# Patient Record
Sex: Male | Born: 1945
Health system: Southern US, Community
[De-identification: ages and names within clinical notes are randomized; demographics above are authoritative.]

## PROBLEM LIST (undated history)

## (undated) DIAGNOSIS — I10 Essential (primary) hypertension: Secondary | ICD-10-CM

## (undated) DIAGNOSIS — R0602 Shortness of breath: Secondary | ICD-10-CM

## (undated) DIAGNOSIS — G4730 Sleep apnea, unspecified: Secondary | ICD-10-CM

## (undated) DIAGNOSIS — F419 Anxiety disorder, unspecified: Secondary | ICD-10-CM

## (undated) DIAGNOSIS — I4891 Unspecified atrial fibrillation: Secondary | ICD-10-CM

## (undated) HISTORY — PX: GREEN LIGHT LASER TURP (TRANSURETHRAL RESECTION OF PROSTATE: SHX6260

## (undated) HISTORY — PX: CYSTOSCOPY: SUR368

## (undated) HISTORY — PX: OTHER SURGICAL HISTORY: SHX169

---

## 2003-06-11 ENCOUNTER — Ambulatory Visit (HOSPITAL_COMMUNITY): Admission: RE | Admit: 2003-06-11 | Discharge: 2003-06-11 | Payer: Self-pay | Admitting: Internal Medicine

## 2014-04-02 ENCOUNTER — Telehealth (INDEPENDENT_AMBULATORY_CARE_PROVIDER_SITE_OTHER): Payer: Self-pay | Admitting: *Deleted

## 2014-04-02 NOTE — Telephone Encounter (Signed)
Patient leaves message   He needs to schedule TCS --last 7 years ago.  Please call him when you can   [phone]

## 2014-04-04 ENCOUNTER — Encounter (INDEPENDENT_AMBULATORY_CARE_PROVIDER_SITE_OTHER): Payer: Self-pay

## 2014-04-04 ENCOUNTER — Other Ambulatory Visit (INDEPENDENT_AMBULATORY_CARE_PROVIDER_SITE_OTHER): Payer: Self-pay | Admitting: *Deleted

## 2014-04-04 DIAGNOSIS — Z86010 Personal history of colonic polyps: Secondary | ICD-10-CM

## 2014-04-04 NOTE — Telephone Encounter (Signed)
TCS sch'd 06/20/14, patient aware

## 2014-04-25 ENCOUNTER — Telehealth (INDEPENDENT_AMBULATORY_CARE_PROVIDER_SITE_OTHER): Payer: Self-pay | Admitting: *Deleted

## 2014-04-25 DIAGNOSIS — Z1211 Encounter for screening for malignant neoplasm of colon: Secondary | ICD-10-CM

## 2014-04-25 NOTE — Telephone Encounter (Signed)
Patient needs movi prep 

## 2014-04-27 MED ORDER — PEG-KCL-NACL-NASULF-NA ASC-C 100 G PO SOLR: 1.0000 | Freq: Once | ORAL | Status: DC

## 2014-05-23 ENCOUNTER — Telehealth (INDEPENDENT_AMBULATORY_CARE_PROVIDER_SITE_OTHER): Payer: Self-pay | Admitting: *Deleted

## 2014-05-23 NOTE — Telephone Encounter (Signed)
Referring MD/PCP: vyas   Procedure: tcs  Reason/Indication:  Hx polyps  Has patient had this procedure before?  Yes -- 2009 -- scanned  If so, when, by whom and where?    Is there a family history of colon cancer?  no  Who?  What age when diagnosed?    Is patient diabetic?   no      Does patient have prosthetic heart valve?  no  Do you have a pacemaker?  no  Has patient ever had endocarditis? no  Has patient had joint replacement within last 12 months?  no  Does patient tend to be constipated or take laxatives? no  Is patient on Coumadin, Plavix and/or Aspirin? no  Medications: advil prn, tamsulosin 4 mg daily, lisinopril 40 mg daily, simvastatin 80 mg 1/2 tab daily, verapamil 240 mg daily  Allergies: nkda  Medication Adjustment:   Procedure date & time: 06/20/14 at 1030

## 2014-05-28 NOTE — Telephone Encounter (Signed)
agree

## 2014-06-05 ENCOUNTER — Encounter (HOSPITAL_COMMUNITY): Payer: Self-pay | Admitting: Pharmacy Technician

## 2014-06-20 ENCOUNTER — Encounter (HOSPITAL_COMMUNITY)
Admission: RE | Disposition: A | Discharge status: Home / Self Care | Payer: Self-pay | Source: Ambulatory Visit | Attending: Internal Medicine

## 2014-06-20 ENCOUNTER — Encounter (HOSPITAL_COMMUNITY): Payer: Self-pay | Admitting: *Deleted

## 2014-06-20 ENCOUNTER — Ambulatory Visit (HOSPITAL_COMMUNITY)
Admission: RE | Admit: 2014-06-20 | Discharge: 2014-06-20 | Disposition: A | Discharge status: Home / Self Care | Payer: Medicare Other | Source: Ambulatory Visit | Attending: Internal Medicine | Admitting: Internal Medicine

## 2014-06-20 DIAGNOSIS — Z87891 Personal history of nicotine dependence: Secondary | ICD-10-CM | POA: Insufficient documentation

## 2014-06-20 DIAGNOSIS — K5730 Diverticulosis of large intestine without perforation or abscess without bleeding: Secondary | ICD-10-CM | POA: Insufficient documentation

## 2014-06-20 DIAGNOSIS — Z1211 Encounter for screening for malignant neoplasm of colon: Secondary | ICD-10-CM | POA: Diagnosis present

## 2014-06-20 DIAGNOSIS — K644 Residual hemorrhoidal skin tags: Secondary | ICD-10-CM | POA: Diagnosis not present

## 2014-06-20 DIAGNOSIS — I10 Essential (primary) hypertension: Secondary | ICD-10-CM | POA: Insufficient documentation

## 2014-06-20 DIAGNOSIS — Z79899 Other long term (current) drug therapy: Secondary | ICD-10-CM | POA: Diagnosis not present

## 2014-06-20 DIAGNOSIS — Z86010 Personal history of colonic polyps: Secondary | ICD-10-CM | POA: Insufficient documentation

## 2014-06-20 DIAGNOSIS — K649 Unspecified hemorrhoids: Secondary | ICD-10-CM | POA: Diagnosis not present

## 2014-06-20 HISTORY — PX: COLONOSCOPY: SHX5424

## 2014-06-20 HISTORY — DX: Essential (primary) hypertension: I10

## 2014-06-20 HISTORY — DX: Shortness of breath: R06.02

## 2014-06-20 SURGERY — COLONOSCOPY
Anesthesia: Moderate Sedation

## 2014-06-20 MED ORDER — MIDAZOLAM HCL 5 MG/5ML IJ SOLN
INTRAMUSCULAR | Status: DC | PRN
  Administered 2014-06-20 (×2): 2 mg via INTRAVENOUS
  Administered 2014-06-20: 3 mg via INTRAVENOUS

## 2014-06-20 MED ORDER — MIDAZOLAM HCL 5 MG/5ML IJ SOLN
INTRAMUSCULAR | Status: AC
  Filled 2014-06-20: qty 10

## 2014-06-20 MED ORDER — SODIUM CHLORIDE 0.9 % IV SOLN
INTRAVENOUS | Status: DC
  Administered 2014-06-20: 1000 mL via INTRAVENOUS

## 2014-06-20 MED ORDER — MEPERIDINE HCL 50 MG/ML IJ SOLN
INTRAMUSCULAR | Status: DC | PRN
  Administered 2014-06-20 (×2): 25 mg via INTRAVENOUS

## 2014-06-20 MED ORDER — MEPERIDINE HCL 50 MG/ML IJ SOLN
INTRAMUSCULAR | Status: AC
  Filled 2014-06-20: qty 1

## 2014-06-20 MED ORDER — STERILE WATER FOR IRRIGATION IR SOLN
Status: DC | PRN
  Administered 2014-06-20: 11:00:00

## 2014-06-20 NOTE — Op Note (Signed)
COLONOSCOPY PROCEDURE REPORT  PATIENT:  Robert Warren  MR#:  [id number] Birthdate:  1946/06/01, 68 y.o., male Endoscopist:  Dr. Rogene Houston, MD Referred By:  Dr. Glenda Chroman, MD Procedure Date: 06/20/2014  Procedure:   Colonoscopy  Indications: Patient is 68 year old African-American male with history of colonic adenomas. He is here for surveillance colonoscopy. Last exam was in June 2009. This is patient's third colonoscopy.  Informed Consent:  The procedure and risks were reviewed with the patient and informed consent was obtained.  Medications:  Demerol 50 mg IV Versed 7 mg IV  Description of procedure:  After a digital rectal exam was performed, that colonoscope was advanced from the anus through the rectum and colon to the area of the cecum, ileocecal valve and appendiceal orifice. The cecum was deeply intubated. These structures were well-seen and photographed for the record. From the level of the cecum and ileocecal valve, the scope was slowly and cautiously withdrawn. The mucosal surfaces were carefully surveyed utilizing scope tip to flexion to facilitate fold flattening as needed. The scope was pulled down into the rectum where a thorough exam including retroflexion was performed.  Findings:   Prep satisfactory except there was some stool at cecum and was easily washed away. Scattered diverticula at cecum, ascending and sigmoid colon. Normal rectal mucosa. Small hemorrhoids below the dentate line.   Therapeutic/Diagnostic Maneuvers Performed:  None  Complications:  None  Cecal Withdrawal Time:  14 minutes  Impression:  Examination performed to cecum. No evidence of recurrent polyps. Data diverticula at cecum, ascending and sigmoid colon. External hemorrhoids.  Recommendations:  Standard instructions given. Next colonoscopy in 7 years.  [name]  06/20/2014 11:07 AM  CC: Dr. Glenda Chroman., MD & Dr. Rayne Du ref. provider found

## 2014-06-20 NOTE — H&P (Addendum)
Robert Warren is an 68 y.o. male.   Chief Complaint: Patient is here for colonoscopy. HPI: Patient is 68 year old American male who has history of colonic adenomas and is here for surveillance colonoscopy. He denies abdominal pain change in bowel habits or rectal bleeding. This is patient's third exam. Family history is negative for CRC.  Past Medical History  Diagnosis Date  . Shortness of breath   . Hypertension     Past Surgical History  Procedure Laterality Date  . Anal cyst removal      Family History  Problem Relation Age of Onset  . Heart disease Mother   . Cancer - Lung Father    Social History:  reports that he quit smoking about 17 years ago. His smoking use included Cigarettes. He has a 30 pack-year smoking history. He does not have any smokeless tobacco history on file. He reports that he does not drink alcohol or use illicit drugs.  Allergies: No Known Allergies  Medications Prior to Admission  Medication Sig Dispense Refill  . lisinopril (PRINIVIL,ZESTRIL) 40 MG tablet Take 40 mg by mouth daily.      . peg 3350 powder (MOVIPREP) 100 G SOLR Take 1 kit (200 g total) by mouth once.  1 kit  0  . simvastatin (ZOCOR) 80 MG tablet Take 40 mg by mouth daily.      . tamsulosin (FLOMAX) 0.4 MG CAPS capsule Take 0.4 mg by mouth daily.      . verapamil (CALAN-SR) 240 MG CR tablet Take 240 mg by mouth daily.        No results found for this or any previous visit (from the past 48 hour(s)). No results found.  ROS  Blood pressure 149/88, pulse 57, temperature 98.4 F (36.9 C), temperature source Oral, resp. rate 15, height 6' 4" (1.93 m), weight 370 lb (167.831 kg), SpO2 98.00%. Physical Exam  Constitutional:  Well-developed obese African-American male  HENT:  Mouth/Throat: Oropharynx is clear and moist.  Eyes: Conjunctivae are normal. No scleral icterus.  Neck: No thyromegaly present.  Multiple skin tags  Cardiovascular: Normal rate, regular rhythm and normal heart  sounds.   No murmur heard. Respiratory: Effort normal and breath sounds normal.  GI: Soft. He exhibits no distension and no mass. There is no tenderness.  Musculoskeletal: He exhibits no edema.  Lymphadenopathy:    He has no cervical adenopathy.  Neurological: He is alert.  Skin: Skin is warm and dry.     Assessment/Plan History of colonic adenomas. Surveillance colonoscopy.  Danity Schmelzer U 06/20/2014, 10:34 AM

## 2014-06-20 NOTE — Discharge Instructions (Signed)
Resume usual medications and high fiber diet. No driving for 24 hours. Next colonoscopy in 7 years.Colonoscopy, Care After Refer to this sheet in the next few weeks. These instructions provide you with information on caring for yourself after your procedure. Your health care provider may also give you more specific instructions. Your treatment has been planned according to current medical practices, but problems sometimes occur. Call your health care provider if you have any problems or questions after your procedure. WHAT TO EXPECT AFTER THE PROCEDURE  After your procedure, it is typical to have the following:  A small amount of blood in your stool.  Moderate amounts of gas and mild abdominal cramping or bloating. HOME CARE INSTRUCTIONS  Do not drive, operate machinery, or sign important documents for 24 hours.  You may shower and resume your regular physical activities, but move at a slower pace for the first 24 hours.  Take frequent rest periods for the first 24 hours.  Walk around or put a warm pack on your abdomen to help reduce abdominal cramping and bloating.  Drink enough fluids to keep your urine clear or pale yellow.  You may resume your normal diet as instructed by your health care provider. Avoid heavy or fried foods that are hard to digest.  Avoid drinking alcohol for 24 hours or as instructed by your health care provider.  Only take over-the-counter or prescription medicines as directed by your health care provider.  If a tissue sample (biopsy) was taken during your procedure:  Do not take aspirin or blood thinners for 7 days, or as instructed by your health care provider.  Do not drink alcohol for 7 days, or as instructed by your health care provider.  Eat soft foods for the first 24 hours. SEEK MEDICAL CARE IF: You have persistent spotting of blood in your stool 2-3 days after the procedure. SEEK IMMEDIATE MEDICAL CARE IF:  You have more than a small spotting of  blood in your stool.  You pass large blood clots in your stool.  Your abdomen is swollen (distended).  You have nausea or vomiting.  You have a fever.  You have increasing abdominal pain that is not relieved with medicine. Document Released: 03/24/2004 Document Revised: 05/31/2013 Document Reviewed: 04/17/2013 [facility] Patient Information 2015 [place], [place]. This information is not intended to replace advice given to you by your health care provider. Make sure you discuss any questions you have with your health care provider.

## 2014-06-22 ENCOUNTER — Encounter (HOSPITAL_COMMUNITY): Payer: Self-pay | Admitting: Internal Medicine

## 2015-09-19 DIAGNOSIS — M1712 Unilateral primary osteoarthritis, left knee: Secondary | ICD-10-CM | POA: Diagnosis not present

## 2015-10-10 DIAGNOSIS — M1712 Unilateral primary osteoarthritis, left knee: Secondary | ICD-10-CM | POA: Diagnosis not present

## 2015-12-23 DIAGNOSIS — R42 Dizziness and giddiness: Secondary | ICD-10-CM | POA: Diagnosis not present

## 2015-12-25 DIAGNOSIS — Z789 Other specified health status: Secondary | ICD-10-CM | POA: Diagnosis not present

## 2015-12-25 DIAGNOSIS — L03019 Cellulitis of unspecified finger: Secondary | ICD-10-CM | POA: Diagnosis not present

## 2015-12-25 DIAGNOSIS — Z299 Encounter for prophylactic measures, unspecified: Secondary | ICD-10-CM | POA: Diagnosis not present

## 2015-12-26 DIAGNOSIS — R42 Dizziness and giddiness: Secondary | ICD-10-CM | POA: Diagnosis not present

## 2015-12-26 DIAGNOSIS — R279 Unspecified lack of coordination: Secondary | ICD-10-CM | POA: Diagnosis not present

## 2015-12-26 DIAGNOSIS — R930 Abnormal findings on diagnostic imaging of skull and head, not elsewhere classified: Secondary | ICD-10-CM | POA: Diagnosis not present

## 2016-01-06 DIAGNOSIS — R972 Elevated prostate specific antigen [PSA]: Secondary | ICD-10-CM | POA: Diagnosis not present

## 2016-01-07 DIAGNOSIS — R42 Dizziness and giddiness: Secondary | ICD-10-CM | POA: Diagnosis not present

## 2016-01-07 DIAGNOSIS — H903 Sensorineural hearing loss, bilateral: Secondary | ICD-10-CM | POA: Diagnosis not present

## 2016-01-10 DIAGNOSIS — R972 Elevated prostate specific antigen [PSA]: Secondary | ICD-10-CM | POA: Diagnosis not present

## 2016-01-15 DIAGNOSIS — R262 Difficulty in walking, not elsewhere classified: Secondary | ICD-10-CM | POA: Diagnosis not present

## 2016-01-15 DIAGNOSIS — M25561 Pain in right knee: Secondary | ICD-10-CM | POA: Diagnosis not present

## 2016-01-15 DIAGNOSIS — M25562 Pain in left knee: Secondary | ICD-10-CM | POA: Diagnosis not present

## 2016-01-15 DIAGNOSIS — M170 Bilateral primary osteoarthritis of knee: Secondary | ICD-10-CM | POA: Diagnosis not present

## 2016-01-23 DIAGNOSIS — M1712 Unilateral primary osteoarthritis, left knee: Secondary | ICD-10-CM | POA: Diagnosis not present

## 2016-01-23 DIAGNOSIS — M25562 Pain in left knee: Secondary | ICD-10-CM | POA: Diagnosis not present

## 2016-01-28 DIAGNOSIS — R2689 Other abnormalities of gait and mobility: Secondary | ICD-10-CM | POA: Diagnosis not present

## 2016-01-28 DIAGNOSIS — M170 Bilateral primary osteoarthritis of knee: Secondary | ICD-10-CM | POA: Diagnosis not present

## 2016-01-28 DIAGNOSIS — M25561 Pain in right knee: Secondary | ICD-10-CM | POA: Diagnosis not present

## 2016-01-28 DIAGNOSIS — M25562 Pain in left knee: Secondary | ICD-10-CM | POA: Diagnosis not present

## 2016-01-28 DIAGNOSIS — M1711 Unilateral primary osteoarthritis, right knee: Secondary | ICD-10-CM | POA: Diagnosis not present

## 2016-01-30 ENCOUNTER — Encounter: Payer: Self-pay | Admitting: Physical Therapy

## 2016-01-30 ENCOUNTER — Ambulatory Visit: Payer: Medicare Other | Attending: Otolaryngology | Admitting: Physical Therapy

## 2016-01-30 DIAGNOSIS — R42 Dizziness and giddiness: Secondary | ICD-10-CM | POA: Diagnosis not present

## 2016-01-31 NOTE — Therapy (Signed)
[facility] [address] [place], [place], [address] Phone: [phone]   Fax:  [phone]  Physical Therapy Evaluation  Patient Details  Name: Robert Warren MRN: [id number] Date of Birth: 06/06/1946 Referring Provider: Dr. Melissa Montane  Encounter Date: 01/30/2016      PT End of Session - 01/31/16 1521    Visit Number 1   Number of Visits 1  eval only   Authorization Type Medicare   Authorization Time Period 01-30-16 -02-29-16   PT Start Time 1103   PT Stop Time 1145   PT Time Calculation (min) 42 min      Past Medical History  Diagnosis Date  . Shortness of breath   . Hypertension     Past Surgical History  Procedure Laterality Date  . Anal cyst removal    . Colonoscopy N/A 06/20/2014    Procedure: COLONOSCOPY;  Surgeon: Rogene Houston, MD;  [facility];  Service: Endoscopy;  Laterality: N/A;  1030    There were no vitals filed for this visit.       Subjective Assessment - 01/31/16 1514    Subjective Pt reports initial onset of vertigo started in 2009 but worsened in beginning of this year, but has improved within past 3-4 weeks;  pt reports mild dizziness at this time;              [facility] PT Assessment - 01/31/16 0001    Assessment   Medical Diagnosis Vertigo   Referring Provider Dr. Melissa Montane   Onset Date/Surgical Date --  early 2017   Balance Screen   Has the patient fallen in the past 6 months Yes   How many times? 1   Has the patient had a decrease in activity level because of a fear of falling?  No   Is the patient reluctant to leave their home because of a fear of falling?  No   Prior Function   Level of Independence Independent   Vocation Retired            Vestibular Assessment - 01/31/16 0001    Vestibular Assessment   General Observation Pt is a 70 year old gentleman with c/o vertigo that he says started initially in 2009; pt states vertigo was worse in  beginning of this year but has improved within past 3-4 weeks; states he has had episodes of room spinning (which he no longer has) but does have lightheadedness at this time   Symptom Behavior   Type of Dizziness Lightheadedness   Frequency of Dizziness varies   Duration of Dizziness seconds   Aggravating Factors Sit to stand  supine to sit transfer   Occulomotor Exam   Occulomotor Alignment Normal   Positional Testing   Dix-Hallpike Dix-Hallpike Right;Dix-Hallpike Left   Sidelying Test Sidelying Right;Sidelying Left   Dix-Hallpike Right   Dix-Hallpike Right Duration none   Dix-Hallpike Left   Dix-Hallpike Left Duration none   Sidelying Right   Sidelying Right Duration none   Sidelying Left   Sidelying Left Duration none   Positional Sensitivities   Supine to Sitting Lightheadedness   Up from Right Hallpike Lightheadedness   Up from Left Hallpike Lightheadedness   Orthostatics   BP supine (x 5 minutes) 125/81 mmHg   HR supine (x 5 minutes) 49   BP sitting 107/82 mmHg   HR sitting 79   BP standing (after 1 minute) 117/93 mmHg   HR standing (after 1 minute) 79  BP standing (after 3 minutes) 127/84 mmHg   HR standing (after 3 minutes) 73   Orthostatics Comment increased dizziness/light-headedness reported with supine to sit                        PT Education - 01/31/16 1520    Education provided Yes   Education Details Recommend pt follow up with PCP for possible cardiology referral (if needed)   Person(s) Educated Patient   Methods Explanation;Demonstration   Comprehension Verbalized understanding                    Plan - 01/31/16 1522    Clinical Impression Statement Pt presents with c/o dizziness/light-headedness which appears to be related to orthostatic hypotension and possible some arrythmia due to significant fluctuations in HR noted with orthostatic hypotension assessment.  Pt's symptoms somewhat consistent with BPPV which appears to  have resolved at this time with now problem of orthostatic hypotension; pt would benefit from follow up with PCP and cardiology referral   Rehab Potential --  N/A - eval only   PT Frequency 1x / week   PT Duration --  1 week - eval only   PT Treatment/Interventions ADLs/Self Care Home Management;Patient/family education;Other (comment)  Initial evaluation   PT Next Visit Plan N/A - eval only   PT Home Exercise Plan N/A   Recommended Other Services follow up with PCP - cardiology referral recommended   Consulted and Agree with Plan of Care Patient      Patient will benefit from skilled therapeutic intervention in order to improve the following deficits and impairments:  Dizziness  Visit Diagnosis: Dizziness and giddiness - Plan: PT plan of care cert/re-cert      G-Codes - [id number] [id number]    Functional Assessment Tool Used clinical judgment   Functional Limitation Changing and maintaining body position   Changing and Maintaining Body Position Current Status [id number]) At least 20 percent but less than 40 percent impaired, limited or restricted   Changing and Maintaining Body Position Goal Status [id number]) At least 20 percent but less than 40 percent impaired, limited or restricted   Changing and Maintaining Body Position Discharge Status [id number]) At least 20 percent but less than 40 percent impaired, limited or restricted       Problem List There are no active problems to display for this patient.   [id number], PT 01/31/2016, 3:31 PM  [facility] [address] [place], [place], [address] Phone: [phone]   Fax:  [phone]  Name: Robert Warren MRN: [id number] Date of Birth: 01/13/1946

## 2016-02-04 DIAGNOSIS — R2689 Other abnormalities of gait and mobility: Secondary | ICD-10-CM | POA: Diagnosis not present

## 2016-02-04 DIAGNOSIS — M25562 Pain in left knee: Secondary | ICD-10-CM | POA: Diagnosis not present

## 2016-02-04 DIAGNOSIS — M170 Bilateral primary osteoarthritis of knee: Secondary | ICD-10-CM | POA: Diagnosis not present

## 2016-02-04 DIAGNOSIS — M1712 Unilateral primary osteoarthritis, left knee: Secondary | ICD-10-CM | POA: Diagnosis not present

## 2016-02-04 DIAGNOSIS — M25561 Pain in right knee: Secondary | ICD-10-CM | POA: Diagnosis not present

## 2016-02-06 DIAGNOSIS — M170 Bilateral primary osteoarthritis of knee: Secondary | ICD-10-CM | POA: Diagnosis not present

## 2016-02-06 DIAGNOSIS — R2689 Other abnormalities of gait and mobility: Secondary | ICD-10-CM | POA: Diagnosis not present

## 2016-02-06 DIAGNOSIS — M1711 Unilateral primary osteoarthritis, right knee: Secondary | ICD-10-CM | POA: Diagnosis not present

## 2016-02-06 DIAGNOSIS — M25561 Pain in right knee: Secondary | ICD-10-CM | POA: Diagnosis not present

## 2016-02-06 DIAGNOSIS — M25562 Pain in left knee: Secondary | ICD-10-CM | POA: Diagnosis not present

## 2016-02-10 DIAGNOSIS — M1712 Unilateral primary osteoarthritis, left knee: Secondary | ICD-10-CM | POA: Diagnosis not present

## 2016-02-10 DIAGNOSIS — M25562 Pain in left knee: Secondary | ICD-10-CM | POA: Diagnosis not present

## 2016-02-10 DIAGNOSIS — M170 Bilateral primary osteoarthritis of knee: Secondary | ICD-10-CM | POA: Diagnosis not present

## 2016-02-10 DIAGNOSIS — M25561 Pain in right knee: Secondary | ICD-10-CM | POA: Diagnosis not present

## 2016-02-10 DIAGNOSIS — R2689 Other abnormalities of gait and mobility: Secondary | ICD-10-CM | POA: Diagnosis not present

## 2016-02-11 DIAGNOSIS — R2689 Other abnormalities of gait and mobility: Secondary | ICD-10-CM | POA: Diagnosis not present

## 2016-02-11 DIAGNOSIS — M1711 Unilateral primary osteoarthritis, right knee: Secondary | ICD-10-CM | POA: Diagnosis not present

## 2016-02-11 DIAGNOSIS — M170 Bilateral primary osteoarthritis of knee: Secondary | ICD-10-CM | POA: Diagnosis not present

## 2016-02-11 DIAGNOSIS — M25562 Pain in left knee: Secondary | ICD-10-CM | POA: Diagnosis not present

## 2016-02-11 DIAGNOSIS — M25561 Pain in right knee: Secondary | ICD-10-CM | POA: Diagnosis not present

## 2016-02-18 DIAGNOSIS — R2689 Other abnormalities of gait and mobility: Secondary | ICD-10-CM | POA: Diagnosis not present

## 2016-02-18 DIAGNOSIS — M25561 Pain in right knee: Secondary | ICD-10-CM | POA: Diagnosis not present

## 2016-02-18 DIAGNOSIS — M170 Bilateral primary osteoarthritis of knee: Secondary | ICD-10-CM | POA: Diagnosis not present

## 2016-02-18 DIAGNOSIS — M25562 Pain in left knee: Secondary | ICD-10-CM | POA: Diagnosis not present

## 2016-02-24 ENCOUNTER — Encounter (HOSPITAL_COMMUNITY): Payer: Self-pay | Admitting: Emergency Medicine

## 2016-02-24 ENCOUNTER — Emergency Department (HOSPITAL_COMMUNITY): Payer: Medicare Other

## 2016-02-24 ENCOUNTER — Inpatient Hospital Stay (HOSPITAL_COMMUNITY)
Admission: EM | Admit: 2016-02-24 | Discharge: 2016-02-26 | DRG: 309 | Disposition: A | Discharge status: Home / Self Care | Payer: Medicare Other | Source: Ambulatory Visit | Attending: Internal Medicine | Admitting: Internal Medicine

## 2016-02-24 DIAGNOSIS — R319 Hematuria, unspecified: Secondary | ICD-10-CM | POA: Diagnosis present

## 2016-02-24 DIAGNOSIS — I129 Hypertensive chronic kidney disease with stage 1 through stage 4 chronic kidney disease, or unspecified chronic kidney disease: Secondary | ICD-10-CM | POA: Diagnosis present

## 2016-02-24 DIAGNOSIS — D649 Anemia, unspecified: Secondary | ICD-10-CM

## 2016-02-24 DIAGNOSIS — R748 Abnormal levels of other serum enzymes: Secondary | ICD-10-CM | POA: Diagnosis present

## 2016-02-24 DIAGNOSIS — R7989 Other specified abnormal findings of blood chemistry: Secondary | ICD-10-CM

## 2016-02-24 DIAGNOSIS — R000 Tachycardia, unspecified: Secondary | ICD-10-CM | POA: Diagnosis not present

## 2016-02-24 DIAGNOSIS — R778 Other specified abnormalities of plasma proteins: Secondary | ICD-10-CM | POA: Diagnosis present

## 2016-02-24 DIAGNOSIS — N400 Enlarged prostate without lower urinary tract symptoms: Secondary | ICD-10-CM | POA: Diagnosis not present

## 2016-02-24 DIAGNOSIS — R809 Proteinuria, unspecified: Secondary | ICD-10-CM | POA: Diagnosis present

## 2016-02-24 DIAGNOSIS — G4733 Obstructive sleep apnea (adult) (pediatric): Secondary | ICD-10-CM | POA: Diagnosis present

## 2016-02-24 DIAGNOSIS — A419 Sepsis, unspecified organism: Secondary | ICD-10-CM | POA: Diagnosis present

## 2016-02-24 DIAGNOSIS — D509 Iron deficiency anemia, unspecified: Secondary | ICD-10-CM | POA: Diagnosis present

## 2016-02-24 DIAGNOSIS — N189 Chronic kidney disease, unspecified: Secondary | ICD-10-CM | POA: Diagnosis present

## 2016-02-24 DIAGNOSIS — Z6841 Body Mass Index (BMI) 40.0 and over, adult: Secondary | ICD-10-CM | POA: Diagnosis not present

## 2016-02-24 DIAGNOSIS — R300 Dysuria: Secondary | ICD-10-CM | POA: Diagnosis not present

## 2016-02-24 DIAGNOSIS — Z87891 Personal history of nicotine dependence: Secondary | ICD-10-CM

## 2016-02-24 DIAGNOSIS — I4891 Unspecified atrial fibrillation: Secondary | ICD-10-CM | POA: Diagnosis not present

## 2016-02-24 DIAGNOSIS — N390 Urinary tract infection, site not specified: Secondary | ICD-10-CM | POA: Diagnosis present

## 2016-02-24 DIAGNOSIS — N200 Calculus of kidney: Secondary | ICD-10-CM

## 2016-02-24 DIAGNOSIS — N183 Chronic kidney disease, stage 3 (moderate): Secondary | ICD-10-CM

## 2016-02-24 DIAGNOSIS — Z8249 Family history of ischemic heart disease and other diseases of the circulatory system: Secondary | ICD-10-CM

## 2016-02-24 DIAGNOSIS — R531 Weakness: Secondary | ICD-10-CM | POA: Diagnosis not present

## 2016-02-24 DIAGNOSIS — R0602 Shortness of breath: Secondary | ICD-10-CM | POA: Diagnosis not present

## 2016-02-24 DIAGNOSIS — E785 Hyperlipidemia, unspecified: Secondary | ICD-10-CM | POA: Diagnosis present

## 2016-02-24 DIAGNOSIS — Z79899 Other long term (current) drug therapy: Secondary | ICD-10-CM

## 2016-02-24 HISTORY — DX: Unspecified atrial fibrillation: I48.91

## 2016-02-24 LAB — CBC WITH DIFFERENTIAL/PLATELET
BASOS ABS: 0 10*3/uL (ref 0.0–0.1)
Basophils Relative: 0 %
EOS PCT: 0 %
Eosinophils Absolute: 0 10*3/uL (ref 0.0–0.7)
HEMATOCRIT: 40 % (ref 39.0–52.0)
Hemoglobin: 12.6 g/dL — ABNORMAL LOW (ref 13.0–17.0)
LYMPHS PCT: 5 %
Lymphs Abs: 0.9 10*3/uL (ref 0.7–4.0)
MCH: 23.6 pg — ABNORMAL LOW (ref 26.0–34.0)
MCHC: 31.5 g/dL (ref 30.0–36.0)
MCV: 74.8 fL — AB (ref 78.0–100.0)
MONO ABS: 1.4 10*3/uL — AB (ref 0.1–1.0)
Monocytes Relative: 9 %
Neutro Abs: 13.9 10*3/uL — ABNORMAL HIGH (ref 1.7–7.7)
Neutrophils Relative %: 86 %
PLATELETS: 207 10*3/uL (ref 150–400)
RBC: 5.35 MIL/uL (ref 4.22–5.81)
RDW: 17.5 % — ABNORMAL HIGH (ref 11.5–15.5)
WBC: 16.2 10*3/uL — ABNORMAL HIGH (ref 4.0–10.5)

## 2016-02-24 LAB — URINALYSIS, ROUTINE W REFLEX MICROSCOPIC
GLUCOSE, UA: NEGATIVE mg/dL
Ketones, ur: 15 mg/dL — AB
Nitrite: NEGATIVE
PH: 5 (ref 5.0–8.0)
Specific Gravity, Urine: 1.035 — ABNORMAL HIGH (ref 1.005–1.030)

## 2016-02-24 LAB — BASIC METABOLIC PANEL
ANION GAP: 7 (ref 5–15)
BUN: 18 mg/dL (ref 6–20)
CALCIUM: 8.7 mg/dL — AB (ref 8.9–10.3)
CO2: 22 mmol/L (ref 22–32)
Chloride: 107 mmol/L (ref 101–111)
Creatinine, Ser: 1.56 mg/dL — ABNORMAL HIGH (ref 0.61–1.24)
GFR calc Af Amer: 51 mL/min — ABNORMAL LOW (ref >60)
GFR calc non Af Amer: 44 mL/min — ABNORMAL LOW (ref >60)
GLUCOSE: 130 mg/dL — AB (ref 65–99)
Potassium: 4.1 mmol/L (ref 3.5–5.1)
Sodium: 136 mmol/L (ref 135–145)

## 2016-02-24 LAB — URINE MICROSCOPIC-ADD ON

## 2016-02-24 LAB — BRAIN NATRIURETIC PEPTIDE: B Natriuretic Peptide: 173.7 pg/mL — ABNORMAL HIGH (ref 0.0–100.0)

## 2016-02-24 LAB — TROPONIN I: Troponin I: 0.13 ng/mL (ref <0.03)

## 2016-02-24 MED ORDER — CEPHALEXIN 250 MG PO CAPS
500.0000 mg | ORAL_CAPSULE | Freq: Once | ORAL | Status: AC
  Administered 2016-02-24: 500 mg via ORAL
  Filled 2016-02-24: qty 2

## 2016-02-24 MED ORDER — DEXTROSE 5 % IV SOLN
5.0000 mg/h | Freq: Once | INTRAVENOUS | Status: AC
  Administered 2016-02-24: 10 mg/h via INTRAVENOUS
  Filled 2016-02-24: qty 100

## 2016-02-24 MED ORDER — ASPIRIN 81 MG PO CHEW
324.0000 mg | CHEWABLE_TABLET | Freq: Once | ORAL | Status: AC
  Administered 2016-02-24: 324 mg via ORAL
  Filled 2016-02-24: qty 4

## 2016-02-24 MED ORDER — DILTIAZEM HCL 25 MG/5ML IV SOLN
20.0000 mg | Freq: Once | INTRAVENOUS | Status: AC
  Administered 2016-02-24: 20 mg via INTRAVENOUS
  Filled 2016-02-24: qty 5

## 2016-02-24 NOTE — ED Notes (Signed)
Patient did not take his verapamil yesterday.

## 2016-02-24 NOTE — ED Notes (Signed)
Patient from [facility] via ambulance with shortness of breath and weakness for the last three days.  Patient was found to be in Afib at a rate of 150.  He was given fluids during his transport to ED.  Upon arrival he continues in Afib, rate of 130's.  Patient has had an increase in urine output in the last 24 hours.

## 2016-02-24 NOTE — ED Provider Notes (Signed)
CSN: [id number]     Arrival date & time 02/24/16  1915 History   First MD Initiated Contact with Patient 02/24/16 1919     Chief Complaint  Patient presents with  . Atrial Fibrillation     (Consider location/radiation/quality/duration/timing/severity/associated sxs/prior Treatment) Patient is a 70 y.o. male presenting with atrial fibrillation and hematuria. The history is provided by the patient.  Atrial Fibrillation This is a new problem. Episode onset: unknown, just told of it today. The problem occurs constantly. The problem has been unchanged. Associated symptoms include chills, urinary symptoms (blood in urine) and vertigo. Pertinent negatives include no abdominal pain, anorexia, arthralgias, chest pain, congestion, coughing, diaphoresis, fever, nausea, rash, vomiting or weakness. Nothing aggravates the symptoms. He has tried nothing for the symptoms.  Hematuria This is a new problem. Episode onset: 4 days ago. The problem occurs constantly. The problem has been unchanged. Associated symptoms include chills, urinary symptoms (blood in urine) and vertigo. Pertinent negatives include no abdominal pain, anorexia, arthralgias, chest pain, congestion, coughing, diaphoresis, fever, nausea, rash, vomiting or weakness. Nothing aggravates the symptoms. He has tried nothing for the symptoms.    Past Medical History  Diagnosis Date  . Shortness of breath   . Hypertension   . Atrial fibrillation [facility])    Past Surgical History  Procedure Laterality Date  . Anal cyst removal    . Colonoscopy N/A 06/20/2014    Procedure: COLONOSCOPY;  Surgeon: Rogene Houston, MD;  [facility];  Service: Endoscopy;  Laterality: N/A;  1030   Family History  Problem Relation Age of Onset  . Heart disease Mother   . Cancer - Lung Father    Social History  Substance Use Topics  . Smoking status: Former Smoker -- 1.00 packs/day for 30 years    Types: Cigarettes    Quit date: 08/23/1996  . Smokeless  tobacco: None  . Alcohol Use: No    Review of Systems  Constitutional: Positive for chills. Negative for fever and diaphoresis.  HENT: Negative for congestion.   Eyes: Negative for visual disturbance.  Respiratory: Negative for cough.   Cardiovascular: Negative for chest pain.  Gastrointestinal: Negative for nausea, vomiting, abdominal pain, diarrhea, constipation, blood in stool and anorexia.  Genitourinary: Positive for dysuria and hematuria.  Musculoskeletal: Negative for arthralgias.  Skin: Negative for rash.  Neurological: Positive for vertigo. Negative for weakness.  Psychiatric/Behavioral: Negative for confusion and agitation.      Allergies  Review of patient's allergies indicates no known allergies.  Home Medications   Prior to Admission medications   Medication Sig Start Date End Date Taking? Authorizing Provider  buPROPion (WELLBUTRIN SR) 200 MG 12 hr tablet Take 200 mg by mouth daily as needed. 12/10/15  Yes Historical Provider, MD  clonazePAM (KLONOPIN) 0.5 MG tablet Take 0.5 mg by mouth daily as needed for anxiety.  01/08/16  Yes Historical Provider, MD  diclofenac (VOLTAREN) 75 MG EC tablet Take 75 mg by mouth 2 (two) times daily as needed (arthritis pain).   Yes Historical Provider, MD  finasteride (PROSCAR) 5 MG tablet Take 5 mg by mouth at bedtime. 11/17/15  Yes Historical Provider, MD  lisinopril (PRINIVIL,ZESTRIL) 40 MG tablet Take 40 mg by mouth daily.   Yes Historical Provider, MD  meclizine (ANTIVERT) 25 MG tablet Take 25 mg by mouth daily as needed (vertigo).  12/25/15  Yes Historical Provider, MD  simvastatin (ZOCOR) 80 MG tablet Take 40 mg by mouth at bedtime.    Yes Historical Provider,  MD  tamsulosin (FLOMAX) 0.4 MG CAPS capsule Take 0.4 mg by mouth at bedtime.    Yes Historical Provider, MD  traZODone (DESYREL) 50 MG tablet Take 50 mg by mouth at bedtime. 01/09/16  Yes Historical Provider, MD  verapamil (CALAN-SR) 240 MG CR tablet Take 240 mg by mouth daily.    Yes Historical Provider, MD   BP 101/75 mmHg  Pulse 76  Temp(Src) 99.8 F (37.7 C) (Oral)  Resp 20  Ht 6\' 4"  (1.93 m)  Wt 168.602 kg  BMI 45.26 kg/m2  SpO2 96% Physical Exam  Constitutional: He is oriented to person, place, and time. He appears well-developed and well-nourished. No distress.  HENT:  Head: Normocephalic and atraumatic.  Eyes: Conjunctivae are normal.  Cardiovascular: Normal heart sounds.   No murmur heard. Tachycardic  Pulmonary/Chest: Effort normal and breath sounds normal.  Abdominal: Soft. There is no tenderness.  Genitourinary: Rectum normal, prostate normal and penis normal. No penile tenderness.  No prostate tenderness  Musculoskeletal: He exhibits no edema.  Neurological: He is alert and oriented to person, place, and time.  Skin: Skin is warm. He is not diaphoretic.  Psychiatric: He has a normal mood and affect. His behavior is normal.  Nursing note and vitals reviewed.   ED Course  Procedures (including critical care time) Labs Review Labs Reviewed  URINALYSIS, ROUTINE W REFLEX MICROSCOPIC (NOT AT [facility]) - Abnormal; Notable for the following:    Color, Urine ORANGE (*)    APPearance CLOUDY (*)    Specific Gravity, Urine 1.035 (*)    Hgb urine dipstick MODERATE (*)    Bilirubin Urine MODERATE (*)    Ketones, ur 15 (*)    Protein, ur >300 (*)    Leukocytes, UA SMALL (*)    All other components within normal limits  CBC WITH DIFFERENTIAL/PLATELET - Abnormal; Notable for the following:    WBC 16.2 (*)    Hemoglobin 12.6 (*)    MCV 74.8 (*)    MCH 23.6 (*)    RDW 17.5 (*)    Neutro Abs 13.9 (*)    Monocytes Absolute 1.4 (*)    All other components within normal limits  BASIC METABOLIC PANEL - Abnormal; Notable for the following:    Glucose, Bld 130 (*)    Creatinine, Ser 1.56 (*)    Calcium 8.7 (*)    GFR calc non Af Amer 44 (*)    GFR calc Af Amer 51 (*)    All other components within normal limits  BRAIN NATRIURETIC PEPTIDE -  Abnormal; Notable for the following:    B Natriuretic Peptide 173.7 (*)    All other components within normal limits  TROPONIN I - Abnormal; Notable for the following:    Troponin I 0.13 (*)    All other components within normal limits  URINE MICROSCOPIC-ADD ON - Abnormal; Notable for the following:    Squamous Epithelial / LPF 0-5 (*)    Bacteria, UA FEW (*)    Casts HYALINE CASTS (*)    All other components within normal limits  HEPATIC FUNCTION PANEL - Abnormal; Notable for the following:    Albumin 3.0 (*)    ALT 15 (*)    Total Bilirubin 1.5 (*)    Indirect Bilirubin 1.1 (*)    All other components within normal limits  TROPONIN I - Abnormal; Notable for the following:    Troponin I 0.03 (*)    All other components within normal limits  TROPONIN I -  Abnormal; Notable for the following:    Troponin I 0.03 (*)    All other components within normal limits  CBC - Abnormal; Notable for the following:    WBC 12.7 (*)    Hemoglobin 12.8 (*)    MCV 75.7 (*)    MCH 23.5 (*)    RDW 17.8 (*)    All other components within normal limits  BASIC METABOLIC PANEL - Abnormal; Notable for the following:    Potassium 3.4 (*)    Glucose, Bld 123 (*)    BUN 21 (*)    Creatinine, Ser 1.53 (*)    Calcium 8.7 (*)    GFR calc non Af Amer 45 (*)    GFR calc Af Amer 52 (*)    All other components within normal limits  HEPARIN LEVEL (UNFRACTIONATED) - Abnormal; Notable for the following:    Heparin Unfractionated 0.12 (*)    All other components within normal limits  URINE CULTURE  TSH  MAGNESIUM  TROPONIN I  HEPARIN LEVEL (UNFRACTIONATED)    Imaging Review Dg Chest Portable 1 View  02/24/2016  CLINICAL DATA:  Shortness of breath and weakness 3 days.  AFib. EXAM: PORTABLE CHEST 1 VIEW COMPARISON:  03/26/2008 FINDINGS: Lungs are somewhat hypoinflated as lordotic technique is demonstrated. There is no focal consolidation or effusion. Borderline stable cardiomegaly. Bones soft tissues are  within normal. IMPRESSION: Hypoinflation without acute cardiopulmonary disease. Electronically Signed   By: Marin Olp M.D.   On: 02/24/2016 19:55   I have personally reviewed and evaluated these images and lab results as part of my medical decision-making.   EKG Interpretation   Date/Time:  Monday February 24 2016 19:20:26 EDT Ventricular Rate:  146 PR Interval:    QRS Duration: 121 QT Interval:  276 QTC Calculation: 431 R Axis:   -64 Text Interpretation:  Atrial fibrillation Paired ventricular premature  complexes Nonspecific IVCD with LAD Probable inferior infarct, age  indeterminate No old tracing to compare Confirmed by KNAPP  MD-J, JON  [id number]) on 02/24/2016 7:27:16 PM      MDM   Final diagnoses:  None    Patient presented from primary care office for fast heart rate on intake vitals, found to have HR 150s on arrival. EKG shows afib with RVR. Rate controlled after 20 dilt bolus and dilt drip at 15. Hemodynamically stable throughout stay in the ED.   Denies any chest pain. Has been short of breath on ambulation over last 6-8 months, not worse recently. Troponin elevated at 0.13. Given ASA 324 in ED. Remained chest pain free. BNP mildly elevated.   CXR shows no acute abnormalities. Cardiology saw patient in ED and recommended metroprolol q6 for rate control.   Patient was at primary care for 3 days hematuria. Hgb 12.6. BMP shows AKI with cr 1.56.   Patient admitted to hospitalist for further management of Afib. Seen with Dr. Tomi Bamberger.     Allie Bossier, MD 02/25/16 1324

## 2016-02-24 NOTE — ED Provider Notes (Signed)
I saw and evaluated the patient, reviewed the resident's note and I agree with the findings and plan.   EKG Interpretation   Date/Time:  Monday February 24 2016 19:20:26 EDT Ventricular Rate:  146 PR Interval:    QRS Duration: 121 QT Interval:  276 QTC Calculation: 431 R Axis:   -64 Text Interpretation:  Atrial fibrillation Paired ventricular premature  complexes Nonspecific IVCD with LAD Probable inferior infarct, age  indeterminate No old tracing to compare Confirmed by Timia Casselman  MD-J, Hoyt Leanos  [id number]) on 02/24/2016 7:27:16 PM      Pt went to to an urgent care to be evaluated for hematuria.  They found that he was tachycardic and was sent into the ED for further evaluation.  Pt was unaware of his heart rate.  EKG shows a fib RVR.   Started on cardizem.  Responded to titrated dose. Cardiology consulted.  Appreciate Dr. Thompson Caul evaluation. UA with hematuria, questionable UTI?  More likely hematuria rather than solely infection. Admitted to the medical service for further treatment.  Dorie Rank, MD 02/26/16 [phone]

## 2016-02-24 NOTE — Consult Note (Signed)
Chief Complaint/Reason for Consult: Atrial Fibrillation   Requesting Physician: PA Doree Albee ED   PCP:  Glenda Chroman, MD Primary Cardiologist:N/A  HPI: Robert Warren is a 70 YOM with history of HTN, OSA, HLD, no known CAD or CHF.  He went to his local urgent care due to recent issues with frank hematuria.  No chest pain, palpitations.  Has had SOB with exertion.  At the urgent care, vitals noted him to have pulse in 150s, therefore sent to [facility] ED.    In ED, asymptomatic, ECG confirmed AFIB with RVR and started on diltiazem.  On questioning, he stated that he was put on verapamil in the 1980s for a "fast beating heart" but he is unclear if that was for AFib or other SVT-- he has stayed on it since then.  No prodromal symptoms of palpitations.    He describes his bleeding as intermittently tea colored or frankly bloody.  He has some lower abdominal pressure and increased urinary frequency.   Labs in Ed showed microcytic anemia, low Ca, proteinuria and + WBC, and Crt 1.5 (unk chronicity).    He does not drink Etoh, previous smoker.   Past Medical History  Diagnosis Date  . Shortness of breath   . Hypertension   . Atrial fibrillation [facility])     Past Surgical History  Procedure Laterality Date  . Anal cyst removal    . Colonoscopy N/A 06/20/2014    Procedure: COLONOSCOPY;  Surgeon: Rogene Houston, MD;  Location: [facility];  Service: Endoscopy;  Laterality: N/A;  1030    Family History  Problem Relation Age of Onset  . Heart disease Mother   . Cancer - Lung Father    Social History:  reports that he quit smoking about 19 years ago. His smoking use included Cigarettes. He has a 30 pack-year smoking history. He does not have any smokeless tobacco history on file. He reports that he does not drink alcohol or use illicit drugs.  Allergies: No Known Allergies  No current facility-administered medications on file prior to encounter.   Current Outpatient Prescriptions on File  Prior to Encounter  Medication Sig Dispense Refill  . lisinopril (PRINIVIL,ZESTRIL) 40 MG tablet Take 40 mg by mouth daily.    . simvastatin (ZOCOR) 80 MG tablet Take 40 mg by mouth at bedtime.     . tamsulosin (FLOMAX) 0.4 MG CAPS capsule Take 0.4 mg by mouth at bedtime.     . verapamil (CALAN-SR) 240 MG CR tablet Take 240 mg by mouth daily.       Results for orders placed or performed during the hospital encounter of 02/24/16 (from the past 48 hour(s))  CBC with Differential/Platelet     Status: Abnormal   Collection Time: 02/24/16  7:50 PM  Result Value Ref Range   WBC 16.2 (H) 4.0 - 10.5 K/uL   RBC 5.35 4.22 - 5.81 MIL/uL   Hemoglobin 12.6 (L) 13.0 - 17.0 g/dL   HCT 40.0 39.0 - 52.0 %   MCV 74.8 (L) 78.0 - 100.0 fL   MCH 23.6 (L) 26.0 - 34.0 pg   MCHC 31.5 30.0 - 36.0 g/dL   RDW 17.5 (H) 11.5 - 15.5 %   Platelets 207 150 - 400 K/uL   Neutrophils Relative % 86 %   Neutro Abs 13.9 (H) 1.7 - 7.7 K/uL   Lymphocytes Relative 5 %   Lymphs Abs 0.9 0.7 - 4.0 K/uL   Monocytes Relative 9 %  Monocytes Absolute 1.4 (H) 0.1 - 1.0 K/uL   Eosinophils Relative 0 %   Eosinophils Absolute 0.0 0.0 - 0.7 K/uL   Basophils Relative 0 %   Basophils Absolute 0.0 0.0 - 0.1 K/uL  Basic metabolic panel     Status: Abnormal   Collection Time: 02/24/16  7:50 PM  Result Value Ref Range   Sodium 136 135 - 145 mmol/L   Potassium 4.1 3.5 - 5.1 mmol/L   Chloride 107 101 - 111 mmol/L   CO2 22 22 - 32 mmol/L   Glucose, Bld 130 (H) 65 - 99 mg/dL   BUN 18 6 - 20 mg/dL   Creatinine, Ser 1.56 (H) 0.61 - 1.24 mg/dL   Calcium 8.7 (L) 8.9 - 10.3 mg/dL   GFR calc non Af Amer 44 (L) >60 mL/min   GFR calc Af Amer 51 (L) >60 mL/min    Comment: (NOTE) The eGFR has been calculated using the CKD EPI equation. This calculation has not been validated in all clinical situations. eGFR's persistently <60 mL/min signify possible Chronic Kidney Disease.    Anion gap 7 5 - 15  Brain natriuretic peptide     Status:  Abnormal   Collection Time: 02/24/16  7:50 PM  Result Value Ref Range   B Natriuretic Peptide 173.7 (H) 0.0 - 100.0 pg/mL  Troponin I     Status: Abnormal   Collection Time: 02/24/16  7:50 PM  Result Value Ref Range   Troponin I 0.13 (HH) <0.03 ng/mL    Comment: CRITICAL RESULT CALLED TO, READ BACK BY AND VERIFIED WITHPatsey Berthold RN [phone] 2041 GREEN R   Urinalysis, Routine w reflex microscopic (not at [facility])     Status: Abnormal   Collection Time: 02/24/16  8:26 PM  Result Value Ref Range   Color, Urine ORANGE (A) YELLOW    Comment: BIOCHEMICALS MAY BE AFFECTED BY COLOR   APPearance CLOUDY (A) CLEAR   Specific Gravity, Urine 1.035 (H) 1.005 - 1.030   pH 5.0 5.0 - 8.0   Glucose, UA NEGATIVE NEGATIVE mg/dL   Hgb urine dipstick MODERATE (A) NEGATIVE   Bilirubin Urine MODERATE (A) NEGATIVE   Ketones, ur 15 (A) NEGATIVE mg/dL   Protein, ur >300 (A) NEGATIVE mg/dL   Nitrite NEGATIVE NEGATIVE   Leukocytes, UA SMALL (A) NEGATIVE  Urine microscopic-add on     Status: Abnormal   Collection Time: 02/24/16  8:26 PM  Result Value Ref Range   Squamous Epithelial / LPF 0-5 (A) NONE SEEN   WBC, UA 6-30 0 - 5 WBC/hpf   RBC / HPF 6-30 0 - 5 RBC/hpf   Bacteria, UA FEW (A) NONE SEEN   Casts HYALINE CASTS (A) NEGATIVE   Urine-Other MUCOUS PRESENT    Dg Chest Portable 1 View  02/24/2016  CLINICAL DATA:  Shortness of breath and weakness 3 days.  AFib. EXAM: PORTABLE CHEST 1 VIEW COMPARISON:  03/26/2008 FINDINGS: Lungs are somewhat hypoinflated as lordotic technique is demonstrated. There is no focal consolidation or effusion. Borderline stable cardiomegaly. Bones soft tissues are within normal. IMPRESSION: Hypoinflation without acute cardiopulmonary disease. Electronically Signed   By: Marin Olp M.D.   On: 02/24/2016 19:55    ROS: Otherwise, review of systems is negative unless per above HPI  Filed Vitals:   02/24/16 2045 02/24/16 2100 02/24/16 2130 02/24/16 2200  BP: 116/81 127/66 134/79  117/83  Pulse: 76 83 78 89  Temp:      TempSrc:  Resp: 29 34 20 25  SpO2: 98% 95% 98% 99%   Wt Readings from Last 10 Encounters:  06/20/14 167.831 kg (370 lb)    PE:  General: No acute distress, Obese HEENT: Atraumatic, EOMI, mucous membranes moist,  CV: tachycardia, no murmurs or gallops. No JVD at 45 degrees. No HJR. No carotid bruits.  Respiratory: Clear, no crackles. Normal work of breathing ABD: Obese and non-tender. No palpable organomegaly.  Extremities: 2+ radial pulses bilaterally. 1+ bilateral LE edema. Neuro/Psych: CN grossly intact, alert and oriented  Assessment/Plan 70 year old male with history of OSA, HTN, HLD presenting with largely asymptomatic atrial fibrillation with rapid ventricular response, in the setting of hematuria, elevated WBC, ?UTI/prostatitis and possibly other electrolyte abnormalities. Mild troponin elevation likely related to demand from RVR.   Atrial Fibrillation: CHADS2VASC score: 2, HASBLED score: 2 - Chronicity unclear, could be longstanding given history of verapamil, but favor more acute given this presentation in the setting of a likely infection.  Given hematuria, leukocytosis, CKD, microcytic anemia, will admit to hospitalist team for medical management of these issues (which could be driving his AFib).   - Evaluate and treat underlying drivers of AF (electrolyte disturbances, infection, anemia, etc) - Given CHADS2VASC of 2, would favor anticoagulation strategy if safe from a bleeding standpoint.  Further blood draws will determine GFR / anemia trend.  If stable, recommend IV heparin as a "bleeding stress test trial" while inpatient to see if he tolerates this assuming underlying mechanism of hematuria is reversible/manageable.  - Recommend rate control with metoprolol. Would given 25 mg po q 6 hours and discontinue diltizaem drip.  Can given IV 5 mg q 1 hour for breakthrough RVR > 130.  Can up-titrate metoprolol to 50 mg q 6 if needed.  Call  cardiology if he converts to normal sinus rhythm as his beta blocker needs may change.  - Please order TTE to look for structural heart disease - Continue to trend troponin q 6 hour until plateau then stop - Goal Mag 2, K 4 - DCCV for unstable tachyarrhythmia - Further recommendations to follow from cardiology tomorrow, please call with any questions    Lolita Cram Means  MD 02/24/2016, 10:32 PM

## 2016-02-24 NOTE — H&P (Addendum)
History and Physical    [name] [id number] DOB: 12/03/1945 DOA: 02/24/2016  Referring MD/NP/PA: Dr. Doree Albee , resident PCP: Glenda Chroman, MD  Patient coming from: Urgent care  Chief Complaint: Blood in my urine  HPI: Robert Warren is a 70 y.o. male with medical history significant of atrial fibrillation, HTN, shortness of breath; who presented to urgent care with complaints of hematuria. Patient was found to have pulse in the 150s and therefore was sent to [facility] for further evaluation.  Patient notes that he's had a history of fast heart rates since the 1980s. He had been placed on verapamil at some point in time, but notes that he did not take that medicine today. His main reason for going to the urgent care studies been having blood in his urine for the last 3 days. Associated symptoms included urgency, hesitancy, dysuria( burning with urination), subjective fever, and chills. He denies having any previous symptoms like this in the past. Patient denies any recent weight gain, and more or less chronically reports being short of breath. He previously had been on inhalers years ago, but has not been on them currently. Reports being sily is winded and can hear himself wheeze whenever doing any strenuous activity. His wife present at bedside notes that he does snores very loudly at night.    ED Course: Upon admission to the emergency department patient was seen to be afebrile, with heart rates up to 146, respirations of 34, blood pressure up to151/35, and O2 saturations maintained on 2 L of nasal cannula oxygen. Lab work revealed WBC of 16.2, hemoglobin of 12.6, platelets 207, BUN 18, creatinine 1.56, glucose 130. Cardiology was consulted by the ED physician and gave recommendations. UA was positive for a few bacteria, small leukocytes, moderate hemoglobin, and 6-30 WBCs. Prostate exam performed by ED resident and revealed no significant tenderness. Patient was started on diltiazem drip  and Keflex in the ED. Cardiology was consulted and evaluated the patient in the ED as well.  Review of Systems: As per HPI otherwise 10 point review of systems negative.   Past Medical History  Diagnosis Date  . Shortness of breath   . Hypertension   . Atrial fibrillation [facility])     Past Surgical History  Procedure Laterality Date  . Anal cyst removal    . Colonoscopy N/A 06/20/2014    Procedure: COLONOSCOPY;  Surgeon: Rogene Houston, MD;  Location: [facility];  Service: Endoscopy;  Laterality: N/A;  1030     reports that he quit smoking about 19 years ago. His smoking use included Cigarettes. He has a 30 pack-year smoking history. He does not have any smokeless tobacco history on file. He reports that he does not drink alcohol or use illicit drugs.  No Known Allergies  Family History  Problem Relation Age of Onset  . Heart disease Mother   . Cancer - Lung Father     Prior to Admission medications   Medication Sig Start Date End Date Taking? Authorizing Provider  buPROPion (WELLBUTRIN SR) 200 MG 12 hr tablet Take 200 mg by mouth daily as needed. 12/10/15  Yes Historical Provider, MD  clonazePAM (KLONOPIN) 0.5 MG tablet Take 0.5 mg by mouth daily as needed for anxiety.  01/08/16  Yes Historical Provider, MD  diclofenac (VOLTAREN) 75 MG EC tablet Take 75 mg by mouth 2 (two) times daily as needed (arthritis pain).   Yes Historical Provider, MD  finasteride (PROSCAR) 5 MG tablet Take 5 mg  by mouth at bedtime. 11/17/15  Yes Historical Provider, MD  lisinopril (PRINIVIL,ZESTRIL) 40 MG tablet Take 40 mg by mouth daily.   Yes Historical Provider, MD  meclizine (ANTIVERT) 25 MG tablet Take 25 mg by mouth daily as needed (vertigo).  12/25/15  Yes Historical Provider, MD  simvastatin (ZOCOR) 80 MG tablet Take 40 mg by mouth at bedtime.    Yes Historical Provider, MD  tamsulosin (FLOMAX) 0.4 MG CAPS capsule Take 0.4 mg by mouth at bedtime.    Yes Historical Provider, MD  traZODone (DESYREL)  50 MG tablet Take 50 mg by mouth at bedtime. 01/09/16  Yes Historical Provider, MD  verapamil (CALAN-SR) 240 MG CR tablet Take 240 mg by mouth daily.   Yes Historical Provider, MD    Physical Exam:  Constitutional:The smell and NAD, calm, comfortable Filed Vitals:   02/24/16 2200 02/24/16 2215 02/24/16 2230 02/24/16 2300  BP: 117/83 118/78 143/106 128/65  Pulse: 89 72 113 53  Temp:      TempSrc:      Resp: 25 16 27 17   SpO2: 99% 95% 94% 93%   Eyes: PERRL, lids and conjunctivae normal ENMT: Mucous membranes are moist. Posterior pharynx clear of any exudate or lesions.Normal dentition.  Neck: normal, supple, no masses, no thyromegaly Respiratory: clear to auscultation bilaterally, no wheezing, no crackles. Normal respiratory effort. No accessory muscle use.  Cardiovascular: Irregular irregular heart rhythm tachycardic, no murmurs / rubs / gallops. No extremity edema. 2+ pedal pulses. No carotid bruits.  Abdomen: no tenderness, no masses palpated. No hepatosplenomegaly. Bowel sounds positive.  Musculoskeletal: no clubbing / cyanosis. No joint deformity upper and lower extremities. Good ROM, no contractures. Normal muscle tone.  Skin: no rashes, lesions, ulcers. No induration Neurologic: CN 2-12 grossly intact. Sensation intact, DTR normal. Strength 5/5 in all 4.  Psychiatric: Normal judgment and insight. Alert and oriented x 3. Normal mood.     Labs on Admission: I have personally reviewed following labs and imaging studies  CBC:  Recent Labs Lab 02/24/16 1950  WBC 16.2*  NEUTROABS 13.9*  HGB 12.6*  HCT 40.0  MCV 74.8*  PLT [id number]   Basic Metabolic Panel:  Recent Labs Lab 02/24/16 1950  NA 136  K 4.1  CL 107  CO2 22  GLUCOSE 130*  BUN 18  CREATININE 1.56*  CALCIUM 8.7*   GFR: CrCl cannot be calculated (Unknown ideal weight.). Liver Function Tests: No results for input(s): AST, ALT, ALKPHOS, BILITOT, PROT, ALBUMIN in the last 168 hours. No results for input(s):  LIPASE, AMYLASE in the last 168 hours. No results for input(s): AMMONIA in the last 168 hours. Coagulation Profile: No results for input(s): INR, PROTIME in the last 168 hours. Cardiac Enzymes:  Recent Labs Lab 02/24/16 1950  TROPONINI 0.13*   BNP (last 3 results) No results for input(s): PROBNP in the last 8760 hours. HbA1C: No results for input(s): HGBA1C in the last 72 hours. CBG: No results for input(s): GLUCAP in the last 168 hours. Lipid Profile: No results for input(s): CHOL, HDL, LDLCALC, TRIG, CHOLHDL, LDLDIRECT in the last 72 hours. Thyroid Function Tests: No results for input(s): TSH, T4TOTAL, FREET4, T3FREE, THYROIDAB in the last 72 hours. Anemia Panel: No results for input(s): VITAMINB12, FOLATE, FERRITIN, TIBC, IRON, RETICCTPCT in the last 72 hours. Urine analysis:    Component Value Date/Time   COLORURINE ORANGE* 02/24/2016 2026   APPEARANCEUR CLOUDY* 02/24/2016 2026   LABSPEC 1.035* 02/24/2016 2026   PHURINE 5.0 02/24/2016 2026   GLUCOSEU  NEGATIVE 02/24/2016 2026   HGBUR MODERATE* 02/24/2016 2026   BILIRUBINUR MODERATE* 02/24/2016 2026   KETONESUR 15* 02/24/2016 2026   PROTEINUR >300* 02/24/2016 2026   NITRITE NEGATIVE 02/24/2016 2026   LEUKOCYTESUR SMALL* 02/24/2016 2026   Sepsis Labs: No results found for this or any previous visit (from the past 240 hour(s)).   Radiological Exams on Admission: Dg Chest Portable 1 View  02/24/2016  CLINICAL DATA:  Shortness of breath and weakness 3 days.  AFib. EXAM: PORTABLE CHEST 1 VIEW COMPARISON:  03/26/2008 FINDINGS: Lungs are somewhat hypoinflated as lordotic technique is demonstrated. There is no focal consolidation or effusion. Borderline stable cardiomegaly. Bones soft tissues are within normal. IMPRESSION: Hypoinflation without acute cardiopulmonary disease. Electronically Signed   By: Marin Olp M.D.   On: 02/24/2016 19:55    EKG: Independently reviewed. Atrial fibrillation with RVR rates  146  Assessment/Plan Question Sepsis with Urinary tract infection with hematuria: Acute. Suspect secondary to acute urinary tract infection. As seen by positive UA,  WBC of 16.2, tachycardia, and tachypnea. Tachycardia and tachypnea could be secondary to acute atrial fibrillation. - Admit to a telemetry bed - Follow-up urine culture - Continue antibiotics of Keflex - Check bladder scans for possible urinary retention - In and out cath prn and intact retention - May need to consult to urology if hematuria persists   Atrial fibrillation with RVR: Chronicity unknown. Patient previously on verapamil for the patient notes his history of fast heart rates since the 1980s. - Cardiology consult and recommendations initiated as seen below - Discontinue diltiazem drip  - Started metoprolol 25 mg po q6 hr  - Metoprolol 5mg  IV prn q - Check echocardiogram   Elevated troponin: Acute initial troponin 0.13. Suspect secondary to ischemia. - Trend cardiac troponin  Anemia: Hemoglobin 12.6 -  Continue to monitor  CKD (chronic kidney disease): On admission creatinine 1.56 with BUN of 18. Baseline unknown. - IV fluids normal saline at 75 ml/hour - Continue to monitor  Hypertension - Continue lisinopril     BPH - Continue Flomax and Proscar  Hyperlipidemia - Pharmacy substitution of atorvastatin for simvastatin  DVT prophylaxis: heparin gtt Code Status: Full Family Communication:  Discussed plan of care the patient's wife and son present at bedside Disposition Plan: Possible discharge in 1-2 days Consults called: Cardiology Admission status: To telemetry inpatient   Norval Morton MD [facility] Pager [phone]  If 7PM-7AM, please contact night-coverage [email or web address] Password [facility]  02/24/2016, 11:36 PM

## 2016-02-24 NOTE — ED Notes (Signed)
Dr Means from Cardiology at bedside.

## 2016-02-24 NOTE — ED Notes (Signed)
Dr. Smith at bedside.

## 2016-02-25 ENCOUNTER — Inpatient Hospital Stay (HOSPITAL_COMMUNITY): Payer: Medicare Other

## 2016-02-25 DIAGNOSIS — R0602 Shortness of breath: Secondary | ICD-10-CM | POA: Diagnosis present

## 2016-02-25 DIAGNOSIS — I4891 Unspecified atrial fibrillation: Secondary | ICD-10-CM | POA: Diagnosis not present

## 2016-02-25 DIAGNOSIS — I129 Hypertensive chronic kidney disease with stage 1 through stage 4 chronic kidney disease, or unspecified chronic kidney disease: Secondary | ICD-10-CM | POA: Diagnosis present

## 2016-02-25 DIAGNOSIS — R748 Abnormal levels of other serum enzymes: Secondary | ICD-10-CM | POA: Diagnosis present

## 2016-02-25 DIAGNOSIS — N390 Urinary tract infection, site not specified: Secondary | ICD-10-CM | POA: Diagnosis not present

## 2016-02-25 DIAGNOSIS — R319 Hematuria, unspecified: Secondary | ICD-10-CM | POA: Diagnosis not present

## 2016-02-25 DIAGNOSIS — N400 Enlarged prostate without lower urinary tract symptoms: Secondary | ICD-10-CM | POA: Diagnosis present

## 2016-02-25 DIAGNOSIS — N183 Chronic kidney disease, stage 3 (moderate): Secondary | ICD-10-CM | POA: Diagnosis not present

## 2016-02-25 DIAGNOSIS — R809 Proteinuria, unspecified: Secondary | ICD-10-CM | POA: Diagnosis present

## 2016-02-25 DIAGNOSIS — Z8249 Family history of ischemic heart disease and other diseases of the circulatory system: Secondary | ICD-10-CM | POA: Diagnosis not present

## 2016-02-25 DIAGNOSIS — Z79899 Other long term (current) drug therapy: Secondary | ICD-10-CM | POA: Diagnosis not present

## 2016-02-25 DIAGNOSIS — E785 Hyperlipidemia, unspecified: Secondary | ICD-10-CM | POA: Diagnosis present

## 2016-02-25 DIAGNOSIS — A419 Sepsis, unspecified organism: Secondary | ICD-10-CM | POA: Diagnosis present

## 2016-02-25 DIAGNOSIS — Z6841 Body Mass Index (BMI) 40.0 and over, adult: Secondary | ICD-10-CM | POA: Diagnosis not present

## 2016-02-25 DIAGNOSIS — G4733 Obstructive sleep apnea (adult) (pediatric): Secondary | ICD-10-CM | POA: Diagnosis present

## 2016-02-25 DIAGNOSIS — N189 Chronic kidney disease, unspecified: Secondary | ICD-10-CM | POA: Diagnosis present

## 2016-02-25 DIAGNOSIS — D509 Iron deficiency anemia, unspecified: Secondary | ICD-10-CM | POA: Diagnosis present

## 2016-02-25 DIAGNOSIS — N200 Calculus of kidney: Secondary | ICD-10-CM | POA: Diagnosis not present

## 2016-02-25 DIAGNOSIS — R7989 Other specified abnormal findings of blood chemistry: Secondary | ICD-10-CM

## 2016-02-25 DIAGNOSIS — Z87891 Personal history of nicotine dependence: Secondary | ICD-10-CM | POA: Diagnosis not present

## 2016-02-25 DIAGNOSIS — R778 Other specified abnormalities of plasma proteins: Secondary | ICD-10-CM | POA: Diagnosis present

## 2016-02-25 LAB — CBC
HCT: 41.2 % (ref 39.0–52.0)
HEMOGLOBIN: 12.8 g/dL — AB (ref 13.0–17.0)
MCH: 23.5 pg — AB (ref 26.0–34.0)
MCHC: 31.1 g/dL (ref 30.0–36.0)
MCV: 75.7 fL — AB (ref 78.0–100.0)
Platelets: 200 10*3/uL (ref 150–400)
RBC: 5.44 MIL/uL (ref 4.22–5.81)
RDW: 17.8 % — ABNORMAL HIGH (ref 11.5–15.5)
WBC: 12.7 10*3/uL — ABNORMAL HIGH (ref 4.0–10.5)

## 2016-02-25 LAB — HEPATIC FUNCTION PANEL
ALT: 15 U/L — AB (ref 17–63)
AST: 18 U/L (ref 15–41)
Albumin: 3 g/dL — ABNORMAL LOW (ref 3.5–5.0)
Alkaline Phosphatase: 68 U/L (ref 38–126)
BILIRUBIN DIRECT: 0.4 mg/dL (ref 0.1–0.5)
BILIRUBIN INDIRECT: 1.1 mg/dL — AB (ref 0.3–0.9)
Total Bilirubin: 1.5 mg/dL — ABNORMAL HIGH (ref 0.3–1.2)
Total Protein: 7.2 g/dL (ref 6.5–8.1)

## 2016-02-25 LAB — BASIC METABOLIC PANEL
Anion gap: 7 (ref 5–15)
BUN: 21 mg/dL — AB (ref 6–20)
CHLORIDE: 106 mmol/L (ref 101–111)
CO2: 24 mmol/L (ref 22–32)
Calcium: 8.7 mg/dL — ABNORMAL LOW (ref 8.9–10.3)
Creatinine, Ser: 1.53 mg/dL — ABNORMAL HIGH (ref 0.61–1.24)
GFR calc Af Amer: 52 mL/min — ABNORMAL LOW (ref >60)
GFR calc non Af Amer: 45 mL/min — ABNORMAL LOW (ref >60)
GLUCOSE: 123 mg/dL — AB (ref 65–99)
POTASSIUM: 3.4 mmol/L — AB (ref 3.5–5.1)
Sodium: 137 mmol/L (ref 135–145)

## 2016-02-25 LAB — MAGNESIUM: Magnesium: 2.1 mg/dL (ref 1.7–2.4)

## 2016-02-25 LAB — TSH: TSH: 1.968 u[IU]/mL (ref 0.350–4.500)

## 2016-02-25 LAB — TROPONIN I
TROPONIN I: 0.03 ng/mL — AB (ref <0.03)
Troponin I: 0.03 ng/mL (ref <0.03)
Troponin I: <0.03 ng/mL (ref <0.03)

## 2016-02-25 LAB — HEPARIN LEVEL (UNFRACTIONATED)
HEPARIN UNFRACTIONATED: 0.16 [IU]/mL — AB (ref 0.30–0.70)
HEPARIN UNFRACTIONATED: 0.32 [IU]/mL (ref 0.30–0.70)
Heparin Unfractionated: 0.12 IU/mL — ABNORMAL LOW (ref 0.30–0.70)

## 2016-02-25 MED ORDER — METOPROLOL TARTRATE 50 MG PO TABS
50.0000 mg | ORAL_TABLET | Freq: Four times a day (QID) | ORAL | Status: DC
  Administered 2016-02-25 – 2016-02-26 (×4): 50 mg via ORAL
  Filled 2016-02-25 (×4): qty 1

## 2016-02-25 MED ORDER — CLONAZEPAM 0.5 MG PO TABS: 0.5000 mg | ORAL_TABLET | Freq: Every day | ORAL | Status: DC | PRN

## 2016-02-25 MED ORDER — METOPROLOL TARTRATE 25 MG PO TABS
25.0000 mg | ORAL_TABLET | Freq: Four times a day (QID) | ORAL | Status: DC
  Administered 2016-02-25 (×2): 25 mg via ORAL
  Filled 2016-02-25 (×2): qty 1

## 2016-02-25 MED ORDER — HEPARIN BOLUS VIA INFUSION
4000.0000 [IU] | Freq: Once | INTRAVENOUS | Status: AC
  Administered 2016-02-25: 4000 [IU] via INTRAVENOUS
  Filled 2016-02-25: qty 4000

## 2016-02-25 MED ORDER — FINASTERIDE 5 MG PO TABS
5.0000 mg | ORAL_TABLET | Freq: Every day | ORAL | Status: DC
  Administered 2016-02-25 (×2): 5 mg via ORAL
  Filled 2016-02-25 (×2): qty 1

## 2016-02-25 MED ORDER — METOPROLOL TARTRATE 5 MG/5ML IV SOLN: 5.0000 mg | INTRAVENOUS | Status: DC | PRN

## 2016-02-25 MED ORDER — SODIUM CHLORIDE 0.9 % IV SOLN
INTRAVENOUS | Status: DC
  Administered 2016-02-25 – 2016-02-26 (×2): via INTRAVENOUS

## 2016-02-25 MED ORDER — HEPARIN (PORCINE) IN NACL 100-0.45 UNIT/ML-% IJ SOLN
1900.0000 [IU]/h | INTRAMUSCULAR | Status: DC
  Administered 2016-02-25: 1900 [IU]/h via INTRAVENOUS
  Filled 2016-02-25 (×2): qty 250

## 2016-02-25 MED ORDER — HEPARIN BOLUS VIA INFUSION
3000.0000 [IU] | Freq: Once | INTRAVENOUS | Status: AC
  Administered 2016-02-25: 3000 [IU] via INTRAVENOUS
  Filled 2016-02-25: qty 3000

## 2016-02-25 MED ORDER — ATORVASTATIN CALCIUM 40 MG PO TABS
40.0000 mg | ORAL_TABLET | Freq: Every day | ORAL | Status: DC
  Administered 2016-02-25: 40 mg via ORAL
  Filled 2016-02-25: qty 1

## 2016-02-25 MED ORDER — LISINOPRIL 20 MG PO TABS
40.0000 mg | ORAL_TABLET | Freq: Every day | ORAL | Status: DC
  Administered 2016-02-25: 40 mg via ORAL
  Filled 2016-02-25: qty 2

## 2016-02-25 MED ORDER — TAMSULOSIN HCL 0.4 MG PO CAPS
0.4000 mg | ORAL_CAPSULE | Freq: Every day | ORAL | Status: DC
  Administered 2016-02-25 (×2): 0.4 mg via ORAL
  Filled 2016-02-25 (×2): qty 1

## 2016-02-25 MED ORDER — IPRATROPIUM-ALBUTEROL 0.5-2.5 (3) MG/3ML IN SOLN: 3.0000 mL | RESPIRATORY_TRACT | Status: DC | PRN

## 2016-02-25 MED ORDER — IPRATROPIUM-ALBUTEROL 0.5-2.5 (3) MG/3ML IN SOLN
3.0000 mL | Freq: Once | RESPIRATORY_TRACT | Status: AC
  Administered 2016-02-25: 3 mL via RESPIRATORY_TRACT
  Filled 2016-02-25: qty 3

## 2016-02-25 MED ORDER — CEPHALEXIN 500 MG PO CAPS
500.0000 mg | ORAL_CAPSULE | Freq: Three times a day (TID) | ORAL | Status: DC
  Administered 2016-02-25 – 2016-02-26 (×5): 500 mg via ORAL
  Filled 2016-02-25 (×5): qty 1

## 2016-02-25 MED ORDER — HEPARIN (PORCINE) IN NACL 100-0.45 UNIT/ML-% IJ SOLN
2800.0000 [IU]/h | INTRAMUSCULAR | Status: DC
  Administered 2016-02-25 (×2): 2700 [IU]/h via INTRAVENOUS
  Filled 2016-02-25 (×3): qty 250

## 2016-02-25 MED ORDER — TRAZODONE HCL 50 MG PO TABS
50.0000 mg | ORAL_TABLET | Freq: Every day | ORAL | Status: DC
  Administered 2016-02-25 (×2): 50 mg via ORAL
  Filled 2016-02-25 (×2): qty 1

## 2016-02-25 MED ORDER — HEPARIN BOLUS VIA INFUSION
5000.0000 [IU] | Freq: Once | INTRAVENOUS | Status: AC
  Administered 2016-02-25: 5000 [IU] via INTRAVENOUS
  Filled 2016-02-25: qty 5000

## 2016-02-25 NOTE — Progress Notes (Signed)
Nutrition Brief Note  Patient identified on the Malnutrition Screening Tool (MST) Report  Wt Readings from Last 15 Encounters:  02/25/16 371 lb 11.2 oz (168.602 kg)  06/20/14 370 lb (167.831 kg)  Pt reports weight loss which has been intentional as pt reports he has been cutting back on sugar sweetened beverages and has been watching his portion sizes at meals.   Body mass index is 45.26 kg/(m^2). Patient meets criteria for morbid obesity based on current BMI.   Current diet order is heart, patient is consuming approximately 100% of meals at this time. Appetite fine currently and PTA. Labs and medications reviewed.   No nutrition interventions warranted at this time. If nutrition issues arise, please consult RD.   Corrin Parker, MS, RD, LDN Pager # [phone] After hours/ weekend pager # [phone]

## 2016-02-25 NOTE — Progress Notes (Addendum)
PROGRESS NOTE  Robert Warren [id number] DOB: [birth year] DOA: 02/24/2016 PCP: Glenda Chroman, MD   Brief summary:  pmh of HTN, afib, sob; who presented to urgent care with c/o hematuria transferred here after being found to be in A. fib with RVR. UA positive. Patient placed on Keflex. Cardiology consulted, he is started on heparin drip, urine clear so far, patient's daughter is a respiratory therapist at [employer]   HPI/Recap of past 24 hours:  Heart rate better, off cardizem drip, denies chest pain, no sob, urine clear   Assessment/Plan: Principal Problem:   Sepsis ([place]) Active Problems:   Atrial fibrillation with RVR ([place])   Anemia   Hematuria of undiagnosed cause   CKD (chronic kidney disease)   UTI (lower urinary tract infection)   Elevated troponin   BPH (benign prostatic hyperplasia)  Afib/rvr: patient states this is new diagnosis to him.  Heart rate better off cardizem drip, now on oral lopressor, on heparin drip,echo pending, cardiology following  Hematuria, patient also reported left flank pain prior to hematuria, now symptom has resolved, renal stone? Will get CT renal stone study without contrast ( cr elevated), urine culture pending, he is empirically started on abx since admission. hgb stable, no more hematuria while on heparin drip. Patient reported has an appointment with his urologist on Monday.  Cr elevation: unknown baseline. On gentle hydration, Repeat bmp in am, renal dosing meds. Hold lisinopril for now  HTN;  bp low normal on 7/4 home meds lisinopril and verapamil held.  He was on cardizem drip initially, now on lopressor, cardiology to continue adjust bp meds.  HLD; on statin  H/o BPH , continue flomax and proscar  Morbid obesity:  Body mass index is 45.26 kg/(m^2). life style modification, need sleep study    Code Status: full  Family Communication: patient   Disposition Plan: home in 1-2  days   Consultants:  cardiology  Procedures:  none  Antibiotics:  Keflex from admission   Objective: BP 117/71 mmHg  Pulse 89  Temp(Src) 99.8 F (37.7 C) (Oral)  Resp 20  Ht 6\' 4"  (1.93 m)  Wt 168.602 kg (371 lb 11.2 oz)  BMI 45.26 kg/m2  SpO2 96%  Intake/Output Summary (Last 24 hours) at 02/25/16 1019 Last data filed at 02/25/16 0255  Gross per 24 hour  Intake    240 ml  Output    100 ml  Net    140 ml   Filed Weights   02/25/16 0135  Weight: 168.602 kg (371 lb 11.2 oz)    Exam:   General:  NAD, obese  Cardiovascular: IRRR  Respiratory: CTABL  Abdomen: Soft/ND/NT, positive BS  Musculoskeletal: No Edema  Neuro: aaox3  Data Reviewed: Basic Metabolic Panel:  Recent Labs Lab 02/24/16 1950 02/25/16 0157  NA 136 137  K 4.1 3.4*  CL 107 106  CO2 22 24  GLUCOSE 130* 123*  BUN 18 21*  CREATININE 1.56* 1.53*  CALCIUM 8.7* 8.7*  MG  --  2.1   Liver Function Tests:  Recent Labs Lab 02/25/16 0157  AST 18  ALT 15*  ALKPHOS 68  BILITOT 1.5*  PROT 7.2  ALBUMIN 3.0*   No results for input(s): LIPASE, AMYLASE in the last 168 hours. No results for input(s): AMMONIA in the last 168 hours. CBC:  Recent Labs Lab 02/24/16 1950 02/25/16 0157  WBC 16.2* 12.7*  NEUTROABS 13.9*  --   HGB 12.6* 12.8*  HCT 40.0 41.2  MCV 74.8* 75.7*  PLT 207 200   Cardiac Enzymes:    Recent Labs Lab 02/24/16 1950 02/25/16 0157 02/25/16 0623  TROPONINI 0.13* 0.03* 0.03*   BNP (last 3 results)  Recent Labs  02/24/16 1950  BNP 173.7*    ProBNP (last 3 results) No results for input(s): PROBNP in the last 8760 hours.  CBG: No results for input(s): GLUCAP in the last 168 hours.  No results found for this or any previous visit (from the past 240 hour(s)).   Studies: Dg Chest Portable 1 View  02/24/2016  CLINICAL DATA:  Shortness of breath and weakness 3 days.  AFib. EXAM: PORTABLE CHEST 1 VIEW COMPARISON:  03/26/2008 FINDINGS: Lungs are somewhat  hypoinflated as lordotic technique is demonstrated. There is no focal consolidation or effusion. Borderline stable cardiomegaly. Bones soft tissues are within normal. IMPRESSION: Hypoinflation without acute cardiopulmonary disease. Electronically Signed   By: Marin Olp M.D.   On: 02/24/2016 19:55    Scheduled Meds: . atorvastatin  40 mg Oral q1800  . cephALEXin  500 mg Oral Q8H  . finasteride  5 mg Oral QHS  . heparin  3,000 Units Intravenous Once  . lisinopril  40 mg Oral Daily  . metoprolol tartrate  50 mg Oral Q6H  . tamsulosin  0.4 mg Oral QHS  . traZODone  50 mg Oral QHS    Continuous Infusions: . sodium chloride 75 mL/hr at 02/25/16 0756  . heparin       Time spent: 54mins  Kadrian Partch MD, PhD  [facility] Pager [phone]. If 7PM-7AM, please contact night-coverage at [email or web address], password [facility] 02/25/2016, 10:19 AM  LOS: 0 days

## 2016-02-25 NOTE — Progress Notes (Signed)
Patient Name: Robert Warren Date of Encounter: 02/25/2016  Principal Problem:   Sepsis ([place]) Active Problems:   Atrial fibrillation with RVR ([facility])   Anemia   Hematuria of undiagnosed cause   CKD (chronic kidney disease)   UTI (lower urinary tract infection)   Elevated troponin   BPH (benign prostatic hyperplasia)   Length of Stay: 0  SUBJECTIVE  Feels well today. Remains unaware of palpitations. HR around 100 bpm at rest. He describes heavy snoring and poor, interrupted sleep. He states that his wife has told him that he stops breathing and asleep. His daughter is a respiratory therapist at [employer] [facility] [employer] and has encouraged him to undergo evaluation for sleep apnea.  CURRENT MEDS . atorvastatin  40 mg Oral q1800  . cephALEXin  500 mg Oral Q8H  . finasteride  5 mg Oral QHS  . lisinopril  40 mg Oral Daily  . metoprolol tartrate  25 mg Oral Q6H  . tamsulosin  0.4 mg Oral QHS  . traZODone  50 mg Oral QHS    OBJECTIVE   Intake/Output Summary (Last 24 hours) at 02/25/16 0755 Last data filed at 02/25/16 0255  Gross per 24 hour  Intake    240 ml  Output    100 ml  Net    140 ml   Filed Weights   02/25/16 0135  Weight: 168.602 kg (371 lb 11.2 oz)    PHYSICAL EXAM Filed Vitals:   02/25/16 0106 02/25/16 0135 02/25/16 0151 02/25/16 0523  BP:   120/69 117/71  Pulse:  106 85 89  Temp:  98.7 F (37.1 C)  99.8 F (37.7 C)  TempSrc:  Oral  Oral  Resp:    20  Height:  6\' 4"  (1.93 m)    Weight:  168.602 kg (371 lb 11.2 oz)    SpO2: 97% 100%  96%   General: Alert, oriented x3, no distress, morbidly obese Head: no evidence of trauma, PERRL, EOMI, no exophtalmos or lid lag, no myxedema, no xanthelasma; normal ears, nose and oropharynx Neck: jugular venous pulsations are difficult to see; brisk carotid pulses without delay and no carotid bruits Chest: clear to auscultation, no signs of consolidation by percussion or palpation, normal fremitus, symmetrical and full respiratory  excursions Cardiovascular: normal position and quality of the apical impulse, irregular rhythm, normal first and second heart sounds, no rubs or gallops, no murmur Abdomen: no tenderness or distention, no masses by palpation, no abnormal pulsatility or arterial bruits, normal bowel sounds, no hepatosplenomegaly Extremities: no clubbing, cyanosis or edema; 2+ radial, ulnar and brachial pulses bilaterally; 2+ right femoral, posterior tibial and dorsalis pedis pulses; 2+ left femoral, posterior tibial and dorsalis pedis pulses; no subclavian or femoral bruits Neurological: grossly nonfocal  LABS  CBC  Recent Labs  02/24/16 1950 02/25/16 0157  WBC 16.2* 12.7*  NEUTROABS 13.9*  --   HGB 12.6* 12.8*  HCT 40.0 41.2  MCV 74.8* 75.7*  PLT 207 [id number]   Basic Metabolic Panel  Recent Labs  02/24/16 1950 02/25/16 0157  NA 136 137  K 4.1 3.4*  CL 107 106  CO2 22 24  GLUCOSE 130* 123*  BUN 18 21*  CREATININE 1.56* 1.53*  CALCIUM 8.7* 8.7*  MG  --  2.1   Liver Function Tests  Recent Labs  02/25/16 0157  AST 18  ALT 15*  ALKPHOS 68  BILITOT 1.5*  PROT 7.2  ALBUMIN 3.0*   No results for input(s): LIPASE, AMYLASE in the last 72 hours.  Cardiac Enzymes  Recent Labs  02/24/16 1950 02/25/16 0157 02/25/16 0623  TROPONINI 0.13* 0.03* 0.03*   Thyroid Function Tests  Recent Labs  02/25/16 0157  TSH 1.968    Radiology Studies Imaging results have been reviewed and Dg Chest Portable 1 View  02/24/2016  CLINICAL DATA:  Shortness of breath and weakness 3 days.  AFib. EXAM: PORTABLE CHEST 1 VIEW COMPARISON:  03/26/2008 FINDINGS: Lungs are somewhat hypoinflated as lordotic technique is demonstrated. There is no focal consolidation or effusion. Borderline stable cardiomegaly. Bones soft tissues are within normal. IMPRESSION: Hypoinflation without acute cardiopulmonary disease. Electronically Signed   By: Marin Olp M.D.   On: 02/24/2016 19:55    TELE Atrial fibrillation with  ventricular rate 95-105   ASSESSMENT AND PLAN  1. Newly diagnosed atrial fibrillation with rapid ventricular response - suspect this may be more long-standing persistent atrial fibrillation. From a clinical standpoint there is strong reason to believe it may be related to obstructive sleep apnea. Unlikely to benefit from cardioversion until full cardiac workup (echo pending) and sleep study are performed. Would simply proceed with anticoagulation and rate control. CHADSVasc score is at least 2. Tolerating intravenous heparin overnight without overt bleeding. I think he is a good candidate for direct oral anticoagulation with Eliquis. He has mild microcytic anemia. He had a colonoscopy within the last couple of years with normal results. He has never had an upper endoscopy but does have a gastroenterologist in [place]. The hematuria is unlikely to be a cause of his anemia and is already resolving with treatment with antibiotics, despite being on heparin. Also seems to be tolerating beta blocker without any worsening of wheezing. Will increase the dose of metoprolol, target heart rate 70 at rest, less than 110-120 with exercise. If his blood pressure becomes low, would reduce the dose of lisinopril.  2. Suspected obstructive sleep apnea - needs an outpatient sleep study  3. Morbid obesity - discussed the relationship between prevalence of atrial fibrillation and weight. Weight loss strongly recommended.   Sanda Klein, MD, [facility] [phone] office [phone] pager 02/25/2016 7:55 AM

## 2016-02-25 NOTE — Progress Notes (Signed)
ANTICOAGULATION CONSULT NOTE - Follow Up Consult  Pharmacy Consult for Heparin  Indication: atrial fibrillation  No Known Allergies  Patient Measurements: Height: 6\' 4"  (193 cm) Weight: (!) 371 lb 11.2 oz (168.602 kg) IBW/kg (Calculated) : 86.8  Vital Signs: Temp: 98.5 F (36.9 C) (07/04 2134) Temp Source: Oral (07/04 2134) BP: 120/85 mmHg (07/04 2230) Pulse Rate: 78 (07/04 2230)  Labs:  Recent Labs  02/24/16 1950 02/25/16 0157 02/25/16 0623 02/25/16 0748 02/25/16 1245 02/25/16 1550 02/25/16 2231  HGB 12.6* 12.8*  --   --   --   --   --   HCT 40.0 41.2  --   --   --   --   --   PLT 207 200  --   --   --   --   --   HEPARINUNFRC  --   --   --  0.12*  --  0.16* 0.32  CREATININE 1.56* 1.53*  --   --   --   --   --   TROPONINI 0.13* 0.03* 0.03*  --  <0.03  --   --     Estimated Creatinine Clearance: 78.1 mL/min (by C-G formula based on Cr of 1.53).   Assessment: Heparin for afib, heparin level is on low end of therapeutic range  Goal of Therapy:  Heparin level 0.3-0.7 units/ml Monitor platelets by anticoagulation protocol: Yes   Plan:  -Increase heparin to 2800 units/hr to prevent sub-therapeutic level  -Heparin level with AM labs  Narda Bonds 02/25/2016,11:31 PM

## 2016-02-25 NOTE — Progress Notes (Signed)
ANTICOAGULATION CONSULT NOTE - Initial Consult  Pharmacy Consult for heparin Indication: atrial fibrillation  No Known Allergies  Patient Measurements: Heparin Dosing Weight: 120kg  Vital Signs: Temp: 98.8 F (37.1 C) (07/03 1954) Temp Source: Axillary (07/03 1954) BP: 120/72 mmHg (07/03 2330) Pulse Rate: 88 (07/03 2330)  Labs:  Recent Labs  02/24/16 1950  HGB 12.6*  HCT 40.0  PLT 207  CREATININE 1.56*  TROPONINI 0.13*    Medical History: Past Medical History  Diagnosis Date  . Shortness of breath   . Hypertension   . Atrial fibrillation [facility])      Assessment: 70yo male c/o SOB and weakness x3d, found to be in Afib w/ RVR with HR to 150s, to begin heparin; known h/o Afib but not currently anticoagulated.  Goal of Therapy:  Heparin level 0.3-0.7 units/ml Monitor platelets by anticoagulation protocol: Yes   Plan:  Will give heparin 5000 units IV bolus x1 followed by gtt at 1900 units/hr and monitor heparin levels and CBC.  Wynona Neat, PharmD, BCPS  02/25/2016,12:08 AM

## 2016-02-25 NOTE — Progress Notes (Signed)
ANTICOAGULATION CONSULT NOTE - Follow Up Consult  Pharmacy Consult:  Heparin Indication: atrial fibrillation  No Known Allergies  Patient Measurements: Height: 6\' 4"  (193 cm) Weight: (!) 371 lb 11.2 oz (168.602 kg) IBW/kg (Calculated) : 86.8 Heparin Dosing Weight: 120 kg  Vital Signs: Temp: 99.8 F (37.7 C) (07/04 0523) Temp Source: Oral (07/04 0523) BP: 117/71 mmHg (07/04 0523) Pulse Rate: 89 (07/04 0523)  Labs:  Recent Labs  02/24/16 1950 02/25/16 0157 02/25/16 0623 02/25/16 0748  HGB 12.6* 12.8*  --   --   HCT 40.0 41.2  --   --   PLT 207 200  --   --   HEPARINUNFRC  --   --   --  0.12*  CREATININE 1.56* 1.53*  --   --   TROPONINI 0.13* 0.03* 0.03*  --     Estimated Creatinine Clearance: 78.1 mL/min (by C-G formula based on Cr of 1.53).      Assessment: 6 YOM with history of Afib not on anticoagulation PTA.  He was found to be in Afib with RVR and IV heparin was initiated.  Heparin level is sub-therapeutic; no bleeding reported.  No issue with heparin infusion per RN.   Goal of Therapy:  Heparin level 0.3-0.7 units/ml Monitor platelets by anticoagulation protocol: Yes    Plan:  - Rebolus with 3000 units IV heparin, then - Increase heparin gtt to 2300 units/hr - Check 6 hr HL - Daily HL / CBC - F/U KCL supplementation   Auther Lyerly D. Mina Marble, PharmD, BCPS Pager:  [phone] 02/25/2016, 9:27 AM

## 2016-02-25 NOTE — Progress Notes (Signed)
ANTICOAGULATION CONSULT NOTE - Follow Up Consult  Pharmacy Consult:  Heparin Indication: atrial fibrillation  No Known Allergies  Patient Measurements: Height: 6\' 4"  (193 cm) Weight: (!) 371 lb 11.2 oz (168.602 kg) IBW/kg (Calculated) : 86.8 Heparin Dosing Weight: 120 kg  Vital Signs: Temp: 98.4 F (36.9 C) (07/04 1425) Temp Source: Oral (07/04 1425) BP: 106/78 mmHg (07/04 1425) Pulse Rate: 102 (07/04 1425)  Labs:  Recent Labs  02/24/16 1950 02/25/16 0157 02/25/16 0623 02/25/16 0748 02/25/16 1245 02/25/16 1550  HGB 12.6* 12.8*  --   --   --   --   HCT 40.0 41.2  --   --   --   --   PLT 207 200  --   --   --   --   HEPARINUNFRC  --   --   --  0.12*  --  0.16*  CREATININE 1.56* 1.53*  --   --   --   --   TROPONINI 0.13* 0.03* 0.03*  --  <0.03  --     Estimated Creatinine Clearance: 78.1 mL/min (by C-G formula based on Cr of 1.53).      Assessment: 61 YOM with history of Afib not on anticoagulation PTA.  He was found to be in Afib with RVR and IV heparin was initiated.  Heparin level is still sub-therapeutic; no bleeding reported.     Goal of Therapy:  Heparin level 0.3-0.7 units/ml Monitor platelets by anticoagulation protocol: Yes    Plan:  - Rebolus with 4000 units IV heparin, then - Increase heparin gtt to 2700 units/hr - Check 6 hr HL - Daily HL / CBC  Thank you Anette Guarneri, PharmD [phone]  02/25/2016, 4:23 PM

## 2016-02-26 ENCOUNTER — Inpatient Hospital Stay (HOSPITAL_COMMUNITY): Payer: Medicare Other

## 2016-02-26 DIAGNOSIS — I4891 Unspecified atrial fibrillation: Secondary | ICD-10-CM

## 2016-02-26 DIAGNOSIS — R319 Hematuria, unspecified: Secondary | ICD-10-CM

## 2016-02-26 DIAGNOSIS — N390 Urinary tract infection, site not specified: Secondary | ICD-10-CM

## 2016-02-26 LAB — ECHOCARDIOGRAM COMPLETE
CHL CUP DOP CALC LVOT VTI: 20.4 cm
EERAT: 6.04
EWDT: 320 ms
FS: 18 % — AB (ref 28–44)
HEIGHTINCHES: 76 in
IV/PV OW: 0.84
LA ID, A-P, ES: 47 mm
LA diam end sys: 47 mm
LA diam index: 1.63 cm/m2
LA vol A4C: 84.4 ml
LAVOL: 83.8 mL
LAVOLIN: 29 mL/m2
LV E/e'average: 6.04
LV e' LATERAL: 10.2 cm/s
LVEEMED: 6.04
LVOT area: 4.52 cm2
LVOTD: 24 mm
LVOTPV: 96.1 cm/s
LVOTSV: 92 mL
MV Dec: 320
MV pk E vel: 61.6 m/s
MVPKAVEL: 32.5 m/s
PW: 13.9 mm — AB (ref 0.6–1.1)
TDI e' lateral: 10.2
TDI e' medial: 7.4
WEIGHTICAEL: 5987.2 [oz_av]

## 2016-02-26 LAB — COMPREHENSIVE METABOLIC PANEL
ALBUMIN: 2.5 g/dL — AB (ref 3.5–5.0)
ALT: 26 U/L (ref 17–63)
ANION GAP: 6 (ref 5–15)
AST: 38 U/L (ref 15–41)
Alkaline Phosphatase: 60 U/L (ref 38–126)
BUN: 17 mg/dL (ref 6–20)
CALCIUM: 8.4 mg/dL — AB (ref 8.9–10.3)
CO2: 27 mmol/L (ref 22–32)
Chloride: 107 mmol/L (ref 101–111)
Creatinine, Ser: 1.35 mg/dL — ABNORMAL HIGH (ref 0.61–1.24)
GFR calc non Af Amer: 52 mL/min — ABNORMAL LOW (ref >60)
GLUCOSE: 96 mg/dL (ref 65–99)
POTASSIUM: 3.8 mmol/L (ref 3.5–5.1)
SODIUM: 140 mmol/L (ref 135–145)
TOTAL PROTEIN: 6.1 g/dL — AB (ref 6.5–8.1)
Total Bilirubin: 0.7 mg/dL (ref 0.3–1.2)

## 2016-02-26 LAB — CBC
HCT: 37.8 % — ABNORMAL LOW (ref 39.0–52.0)
HEMOGLOBIN: 11.5 g/dL — AB (ref 13.0–17.0)
MCH: 23.1 pg — AB (ref 26.0–34.0)
MCHC: 30.4 g/dL (ref 30.0–36.0)
MCV: 75.9 fL — ABNORMAL LOW (ref 78.0–100.0)
PLATELETS: 185 10*3/uL (ref 150–400)
RBC: 4.98 MIL/uL (ref 4.22–5.81)
RDW: 17.8 % — AB (ref 11.5–15.5)
WBC: 5.7 10*3/uL (ref 4.0–10.5)

## 2016-02-26 LAB — MAGNESIUM: MAGNESIUM: 1.9 mg/dL (ref 1.7–2.4)

## 2016-02-26 LAB — HEPARIN LEVEL (UNFRACTIONATED): HEPARIN UNFRACTIONATED: 0.36 [IU]/mL (ref 0.30–0.70)

## 2016-02-26 MED ORDER — METOPROLOL TARTRATE 100 MG PO TABS: 100.0000 mg | ORAL_TABLET | Freq: Two times a day (BID) | ORAL | Status: DC

## 2016-02-26 MED ORDER — PERFLUTREN LIPID MICROSPHERE
INTRAVENOUS | Status: AC
  Filled 2016-02-26: qty 10

## 2016-02-26 MED ORDER — CEPHALEXIN 500 MG PO CAPS: 500.0000 mg | ORAL_CAPSULE | Freq: Three times a day (TID) | ORAL | Status: DC

## 2016-02-26 MED ORDER — PERFLUTREN LIPID MICROSPHERE
1.0000 mL | INTRAVENOUS | Status: AC | PRN
  Administered 2016-02-26: 2 mL via INTRAVENOUS
  Filled 2016-02-26: qty 10

## 2016-02-26 MED ORDER — APIXABAN 5 MG PO TABS
5.0000 mg | ORAL_TABLET | Freq: Two times a day (BID) | ORAL | Status: DC
  Administered 2016-02-26: 5 mg via ORAL
  Filled 2016-02-26: qty 1

## 2016-02-26 MED ORDER — APIXABAN 5 MG PO TABS: 5.0000 mg | ORAL_TABLET | Freq: Two times a day (BID) | ORAL | Status: DC

## 2016-02-26 NOTE — Progress Notes (Signed)
Patient being discharged home per MD order. All discharge instructions given to patient both verbally and in writing.

## 2016-02-26 NOTE — Progress Notes (Signed)
ANTICOAGULATION CONSULT NOTE - Follow Up Consult  Pharmacy Consult:  Heparin Indication: atrial fibrillation  No Known Allergies  Patient Measurements: Height: 6\' 4"  (193 cm) Weight: (!) 374 lb 3.2 oz (169.736 kg) IBW/kg (Calculated) : 86.8 Heparin Dosing Weight: 120 kg  Vital Signs: Temp: 97.5 F (36.4 C) (07/05 0523) Temp Source: Oral (07/05 0523) BP: 126/75 mmHg (07/05 0523) Pulse Rate: 61 (07/05 0523)  Labs:  Recent Labs  02/24/16 1950 02/25/16 0157 02/25/16 0623  02/25/16 1245 02/25/16 1550 02/25/16 2231 02/26/16 0409  HGB 12.6* 12.8*  --   --   --   --   --  11.5*  HCT 40.0 41.2  --   --   --   --   --  37.8*  PLT 207 200  --   --   --   --   --  185  HEPARINUNFRC  --   --   --   < >  --  0.16* 0.32 0.36  CREATININE 1.56* 1.53*  --   --   --   --   --  1.35*  TROPONINI 0.13* 0.03* 0.03*  --  <0.03  --   --   --   < > = values in this interval not displayed.  Estimated Creatinine Clearance: 88.9 mL/min (by C-G formula based on Cr of 1.35).      Assessment: 76 YOM with history of Afib not on anticoagulation PTA.  He was found to be in Afib with RVR and IV heparin was initiated.  Heparin level was therapeutic this AM and then patient lost IV access.  Heparin infusion was resumed around 0700 and patient now to transition to Eliquis.  No bleeding reported.   Goal of Therapy:  Heparin level 0.3-0.7 units/ml  Full anticoagulation Monitor platelets by anticoagulation protocol: Yes    Plan:  - Stop heparin at 1000 and start Eliquis 5mg  PO BID - Pharmacy will sign off and follow peripherally.  Thank you for the consult!    Senan Urey D. Mina Marble, PharmD, BCPS Pager:  [phone] 02/26/2016, 9:09 AM

## 2016-02-26 NOTE — Progress Notes (Signed)
TELEMETRY: Reviewed telemetry pt in NSR. Converted from Afib about 5:15 this am.: Filed Vitals:   02/25/16 1723 02/25/16 2134 02/25/16 2230 02/26/16 0523  BP: 110/68 126/90 120/85 126/75  Pulse: 65 87 78 61  Temp:  98.5 F (36.9 C)  97.5 F (36.4 C)  TempSrc:  Oral  Oral  Resp:  18  20  Height:      Weight:    374 lb 3.2 oz (169.736 kg)  SpO2:  96%  98%    Intake/Output Summary (Last 24 hours) at 02/26/16 0852 Last data filed at 02/26/16 [id number]  Gross per 24 hour  Intake   1380 ml  Output   1250 ml  Net    130 ml   Filed Weights   02/25/16 0135 02/26/16 0523  Weight: 371 lb 11.2 oz (168.602 kg) 374 lb 3.2 oz (169.736 kg)    Subjective Feels well today. No SOB or chest pain. Still has dysuria.  Marland Kitchen atorvastatin  40 mg Oral q1800  . cephALEXin  500 mg Oral Q8H  . finasteride  5 mg Oral QHS  . metoprolol tartrate  50 mg Oral Q6H  . tamsulosin  0.4 mg Oral QHS  . traZODone  50 mg Oral QHS   . sodium chloride 75 mL/hr at 02/26/16 0702  . heparin 2,800 Units/hr (02/26/16 0702)    LABS: Basic Metabolic Panel:  Recent Labs  02/25/16 0157 02/26/16 0409  NA 137 140  K 3.4* 3.8  CL 106 107  CO2 24 27  GLUCOSE 123* 96  BUN 21* 17  CREATININE 1.53* 1.35*  CALCIUM 8.7* 8.4*  MG 2.1 1.9   Liver Function Tests:  Recent Labs  02/25/16 0157 02/26/16 0409  AST 18 38  ALT 15* 26  ALKPHOS 68 60  BILITOT 1.5* 0.7  PROT 7.2 6.1*  ALBUMIN 3.0* 2.5*   No results for input(s): LIPASE, AMYLASE in the last 72 hours. CBC:  Recent Labs  02/24/16 1950 02/25/16 0157 02/26/16 0409  WBC 16.2* 12.7* 5.7  NEUTROABS 13.9*  --   --   HGB 12.6* 12.8* 11.5*  HCT 40.0 41.2 37.8*  MCV 74.8* 75.7* 75.9*  PLT 207 200 185   Cardiac Enzymes:  Recent Labs  02/25/16 0157 02/25/16 0623 02/25/16 1245  TROPONINI 0.03* 0.03* <0.03   BNP: No results for input(s): PROBNP in the last 72 hours. D-Dimer: No results for input(s): DDIMER in the last 72 hours. Hemoglobin  A1C: No results for input(s): HGBA1C in the last 72 hours. Fasting Lipid Panel: No results for input(s): CHOL, HDL, LDLCALC, TRIG, CHOLHDL, LDLDIRECT in the last 72 hours. Thyroid Function Tests:  Recent Labs  02/25/16 0157  TSH 1.968     Radiology/Studies:  Dg Chest Portable 1 View  02/24/2016  CLINICAL DATA:  Shortness of breath and weakness 3 days.  AFib. EXAM: PORTABLE CHEST 1 VIEW COMPARISON:  03/26/2008 FINDINGS: Lungs are somewhat hypoinflated as lordotic technique is demonstrated. There is no focal consolidation or effusion. Borderline stable cardiomegaly. Bones soft tissues are within normal. IMPRESSION: Hypoinflation without acute cardiopulmonary disease. Electronically Signed   By: Marin Olp M.D.   On: 02/24/2016 19:55   Ct Renal Stone Study  02/25/2016  CLINICAL DATA:  Left flank pain and hematuria EXAM: CT ABDOMEN AND PELVIS WITHOUT CONTRAST TECHNIQUE: Multidetector CT imaging of the abdomen and pelvis was performed following the standard protocol without IV contrast. COMPARISON:  None. FINDINGS: Lower chest: There is no pleural fluid identified. The lung bases  are clear. Hepatobiliary: Hepatic steatosis noted. No focal the liver abnormality. The gallbladder is normal. No biliary dilatation. Pancreas: No mass or inflammatory process identified on this un-enhanced exam. Spleen: Within normal limits in size. Adrenals/Urinary Tract: Normal adrenal glands. Normal appearance of the right kidney. Stone within the mid left kidney measures 8 mm. No hydronephrosis or hydroureter. The urinary bladder appears normal. Stomach/Bowel: The stomach is normal. No pathologic dilatation of the large or small bowel loops. Normal appearance of the proximal colon. Numerous distal colonic diverticula noted without acute inflammation. Vascular/Lymphatic: Normal appearance of the abdominal aorta. No enlarged retroperitoneal or mesenteric adenopathy. No enlarged pelvic or inguinal lymph nodes. Reproductive:  There is prostate gland enlargement. Other: There is no ascites or focal fluid collections within the abdomen or pelvis. Supraumbilical ventral abdominal wall hernia contains fat only, image 55 of series 6. Musculoskeletal: No suspicious bone lesions identified. There is degenerative disc disease noted within the lumbar spine. IMPRESSION: 1. No hydronephrosis, hydroureter or ureteral lithiasis noted. 2. Mid left renal calculus measures 8 mm. 3. Prostate gland enlargement. Electronically Signed   By: Kerby Moors M.D.   On: 02/25/2016 19:16    PHYSICAL EXAM General: Well developed, morbidly obese, in no acute distress. Head: Normocephalic, atraumatic, sclera non-icteric, oropharynx is clear Neck: Negative for carotid bruits. JVD not elevated. No adenopathy Lungs: Clear bilaterally to auscultation without wheezes, rales, or rhonchi. Breathing is unlabored. Heart: RRR S1 S2 without murmurs, rubs, or gallops.  Abdomen: Soft, non-tender, non-distended with normoactive bowel sounds. No hepatomegaly. No rebound/guarding. No obvious abdominal masses. Msk:  Strength and tone appears normal for age. Extremities: No clubbing, cyanosis or edema.  Distal pedal pulses are 2+ and equal bilaterally. Neuro: Alert and oriented X 3. Moves all extremities spontaneously. Psych:  Responds to questions appropriately with a normal affect.  ASSESSMENT AND PLAN: 1. Atrial fibrillation. New onset. Now converted to NSR. This may have been triggered by UTI. [place] vasc score at least 2. Recommend switching from IV heparin to DOAC. Will continue metoprolol. Will switch to BID dosing. Echo pending today. Otherwise is stable for DC from a cardiac standpoint. Follow up with Dr. Sallyanne Kuster.   2. Morbid obesity. Possible sleep apnea. Recommend outpatient sleep study.  3. UTi on antibiotics.   Present on Admission:  . Atrial fibrillation with RVR ([place]) . Anemia . Hematuria of undiagnosed cause . CKD (chronic kidney disease) .  UTI (lower urinary tract infection) . Elevated troponin . BPH (benign prostatic hyperplasia) . Sepsis [facility])  Signed, Blakeley Scheier [place], [place] 02/26/2016 8:52 AM     '

## 2016-02-26 NOTE — Progress Notes (Signed)
Pt's copay for Eliquis is $304.62 after insurance.  He has 30 day free trial card for first month's Rx.  Pt states he cannot afford expensive copay.  Notified MD:  Per MD, cardiology office to follow up with pt OP in office for assistance with medications.  Will sign off.    Reinaldo Raddle, RN, BSN  Trauma/Neuro ICU Case Manager [phone]

## 2016-02-26 NOTE — Progress Notes (Signed)
  Echocardiogram 2D Echocardiogram with Definity has been performed.  Tresa Res 02/26/2016, 11:51 AM

## 2016-02-26 NOTE — Care Management Note (Signed)
Case Management Note  Patient Details  Name: Robert Warren MRN: [id number] Date of Birth: 04-08-1946  Subjective/Objective:           Action/Plan:  Pt independent from home with wife.  Pt will discharge home on Eliquis.  CM provided free 30day card and submitted benefit check.  CM contacted pharmacy of choice [facility] on [address] in [place] informed that pharmacy can fill med as prescribed.  CM will continue to follow for discharge needs    Expected Discharge Date:                  Expected Discharge Plan:  Home/Self Care  In-House Referral:     Discharge planning Services  CM Consult, Medication Assistance  Post Acute Care Choice:    Choice offered to:     DME Arranged:    DME Agency:     HH Arranged:    HH Agency:     Status of Service:  In process, will continue to follow  If discussed at Long Length of Stay Meetings, dates discussed:    Additional Comments:  Maryclare Labrador, RN 02/26/2016, 10:11 AM

## 2016-02-26 NOTE — Discharge Summary (Signed)
Physician Discharge Summary  Robert Warren [id number] DOB: 1945/09/02 DOA: 02/24/2016  PCP: Glenda Chroman, MD  Admit date: 02/24/2016 Discharge date: 02/26/2016  Time spent: 35 minutes  Recommendations for Outpatient Follow-up:   1. Please follow-up on heart rates, he was found to be in A. fib with RVR, which converted to normal sinus rhythm during this hospitalization 2. He was discharged on Eliquis 3. Follow-up on blood pressures, antihypertensive agents were changed during this hospitalization, he was discharged on metoprolol 100 mg by mouth twice a day. Lisinopril and verapamil were discontinued 4. Please follow-up on transthoracic echocardiogram performed 02/26/2016   Discharge Diagnoses:  Principal Problem:   Sepsis ([place]) Active Problems:   Atrial fibrillation with RVR ([place])   Anemia   Hematuria of undiagnosed cause   CKD (chronic kidney disease)   UTI (lower urinary tract infection)   Elevated troponin   BPH (benign prostatic hyperplasia)   Morbid obesity ([place])   Discharge Condition: Stable  Diet recommendation: Heart healthy  Filed Weights   02/25/16 0135 02/26/16 0523  Weight: 168.602 kg (371 lb 11.2 oz) 169.736 kg (374 lb 3.2 oz)    History of present illness:  Robert Warren is a 70 y.o. male with medical history significant of atrial fibrillation, HTN, shortness of breath; who presented to urgent care with complaints of hematuria. Patient was found to have pulse in the 150s and therefore was sent to [facility] for further evaluation. Patient notes that he's had a history of fast heart rates since the 1980s. He had been placed on verapamil at some point in time, but notes that he did not take that medicine today. His main reason for going to the urgent care studies been having blood in his urine for the last 3 days. Associated symptoms included urgency, hesitancy, dysuria( burning with urination), subjective fever, and chills. He denies having any previous symptoms  like this in the past. Patient denies any recent weight gain, and more or less chronically reports being short of breath. He previously had been on inhalers years ago, but has not been on them currently. Reports being sily is winded and can hear himself wheeze whenever doing any strenuous activity. His wife present at bedside notes that he does snores very loudly at night.  Hospital Course:  Robert Warren is a 70 year old gentleman with a past medical history of hypertension, chronic kidney disease, admitted to the medicine service on 02/24/2016. He initially presented to urgent care complaining of hematuria. He was found to be in atrial fibrillation rapid ventricular response for which he was transferred to the emergency department. He was started on IV diltiazem. With regard to hematuria this was likely secondary to urinary tract infection, urinalysis positive for UTI. He was treated with Keflex. Cardiology was consulted. He converted to normal sinus rhythm, IV Cardizem stopped and changed to metoprolol 100 mg by mouth twice a day. He had a transthoracic echocardiogram performed on 02/26/2016, results are pending at the time of this dictation. Cardiology recommending NOAC, he was discharged on Eliquis. Overall he shows clinical stability and Dr Martinique felt that from a cardiac standpoint he can go home. He was discharged home in stable condition on 02/26/2016.  Procedures:  Transthoracic echocardiogram performed 02/25/2006.  Consultations:  Cardiology  Discharge Exam: Filed Vitals:   02/25/16 2230 02/26/16 0523  BP: 120/85 126/75  Pulse: 78 61  Temp:  97.5 F (36.4 C)  Resp:  20     General: NAD, obese, he is awake and alert  oriented  Cardiovascular: IRRR  Respiratory: CTABL  Abdomen: Soft/ND/NT, positive BS  Musculoskeletal: No Edema  Neuro: aaox3  Discharge Instructions   Discharge Instructions    Call MD for:  difficulty breathing, headache or visual disturbances     Complete by:  As directed      Call MD for:  extreme fatigue    Complete by:  As directed      Call MD for:  hives    Complete by:  As directed      Call MD for:  persistant dizziness or light-headedness    Complete by:  As directed      Call MD for:  persistant nausea and vomiting    Complete by:  As directed      Call MD for:  redness, tenderness, or signs of infection (pain, swelling, redness, odor or green/yellow discharge around incision site)    Complete by:  As directed      Call MD for:  severe uncontrolled pain    Complete by:  As directed      Call MD for:  temperature >100.4    Complete by:  As directed      Call MD for:    Complete by:  As directed      Diet - low sodium heart healthy    Complete by:  As directed      Increase activity slowly    Complete by:  As directed           Current Discharge Medication List    START taking these medications   Details  apixaban (ELIQUIS) 5 MG TABS tablet Take 1 tablet (5 mg total) by mouth 2 (two) times daily. Qty: 60 tablet, Refills: 3    cephALEXin (KEFLEX) 500 MG capsule Take 1 capsule (500 mg total) by mouth every 8 (eight) hours. Qty: 18 capsule, Refills: 0    metoprolol (LOPRESSOR) 100 MG tablet Take 1 tablet (100 mg total) by mouth 2 (two) times daily. Qty: 60 tablet, Refills: 2      CONTINUE these medications which have NOT CHANGED   Details  buPROPion (WELLBUTRIN SR) 200 MG 12 hr tablet Take 200 mg by mouth daily as needed.    clonazePAM (KLONOPIN) 0.5 MG tablet Take 0.5 mg by mouth daily as needed for anxiety.     finasteride (PROSCAR) 5 MG tablet Take 5 mg by mouth at bedtime.    meclizine (ANTIVERT) 25 MG tablet Take 25 mg by mouth daily as needed (vertigo).     simvastatin (ZOCOR) 80 MG tablet Take 40 mg by mouth at bedtime.     tamsulosin (FLOMAX) 0.4 MG CAPS capsule Take 0.4 mg by mouth at bedtime.     traZODone (DESYREL) 50 MG tablet Take 50 mg by mouth at bedtime.      STOP taking these  medications     diclofenac (VOLTAREN) 75 MG EC tablet      lisinopril (PRINIVIL,ZESTRIL) 40 MG tablet      verapamil (CALAN-SR) 240 MG CR tablet        No Known Allergies Follow-up Information    Follow up with Glenda Chroman, MD In 2 weeks.   Specialty:  Internal Medicine   Contact information:   [place] [id number] [phone]       Follow up with Sanda Klein, MD In 1 week.   Specialty:  Cardiology   Contact information:   [address] [place] [address]  [phone]        The results of significant diagnostics from this hospitalization (including imaging, microbiology, ancillary and laboratory) are listed below for reference.    Significant Diagnostic Studies: Dg Chest Portable 1 View  02/24/2016  CLINICAL DATA:  Shortness of breath and weakness 3 days.  AFib. EXAM: PORTABLE CHEST 1 VIEW COMPARISON:  03/26/2008 FINDINGS: Lungs are somewhat hypoinflated as lordotic technique is demonstrated. There is no focal consolidation or effusion. Borderline stable cardiomegaly. Bones soft tissues are within normal. IMPRESSION: Hypoinflation without acute cardiopulmonary disease. Electronically Signed   By: Marin Olp M.D.   On: 02/24/2016 19:55   Ct Renal Stone Study  02/25/2016  CLINICAL DATA:  Left flank pain and hematuria EXAM: CT ABDOMEN AND PELVIS WITHOUT CONTRAST TECHNIQUE: Multidetector CT imaging of the abdomen and pelvis was performed following the standard protocol without IV contrast. COMPARISON:  None. FINDINGS: Lower chest: There is no pleural fluid identified. The lung bases are clear. Hepatobiliary: Hepatic steatosis noted. No focal the liver abnormality. The gallbladder is normal. No biliary dilatation. Pancreas: No mass or inflammatory process identified on this un-enhanced exam. Spleen: Within normal limits in size. Adrenals/Urinary Tract: Normal adrenal glands. Normal appearance of the right kidney. Stone within the mid left kidney  measures 8 mm. No hydronephrosis or hydroureter. The urinary bladder appears normal. Stomach/Bowel: The stomach is normal. No pathologic dilatation of the large or small bowel loops. Normal appearance of the proximal colon. Numerous distal colonic diverticula noted without acute inflammation. Vascular/Lymphatic: Normal appearance of the abdominal aorta. No enlarged retroperitoneal or mesenteric adenopathy. No enlarged pelvic or inguinal lymph nodes. Reproductive: There is prostate gland enlargement. Other: There is no ascites or focal fluid collections within the abdomen or pelvis. Supraumbilical ventral abdominal wall hernia contains fat only, image 55 of series 6. Musculoskeletal: No suspicious bone lesions identified. There is degenerative disc disease noted within the lumbar spine. IMPRESSION: 1. No hydronephrosis, hydroureter or ureteral lithiasis noted. 2. Mid left renal calculus measures 8 mm. 3. Prostate gland enlargement. Electronically Signed   By: Kerby Moors M.D.   On: 02/25/2016 19:16    Microbiology: No results found for this or any previous visit (from the past 240 hour(s)).   Labs: Basic Metabolic Panel:  Recent Labs Lab 02/24/16 1950 02/25/16 0157 02/26/16 0409  NA 136 137 140  K 4.1 3.4* 3.8  CL 107 106 107  CO2 22 24 27   GLUCOSE 130* 123* 96  BUN 18 21* 17  CREATININE 1.56* 1.53* 1.35*  CALCIUM 8.7* 8.7* 8.4*  MG  --  2.1 1.9   Liver Function Tests:  Recent Labs Lab 02/25/16 0157 02/26/16 0409  AST 18 38  ALT 15* 26  ALKPHOS 68 60  BILITOT 1.5* 0.7  PROT 7.2 6.1*  ALBUMIN 3.0* 2.5*   No results for input(s): LIPASE, AMYLASE in the last 168 hours. No results for input(s): AMMONIA in the last 168 hours. CBC:  Recent Labs Lab 02/24/16 1950 02/25/16 0157 02/26/16 0409  WBC 16.2* 12.7* 5.7  NEUTROABS 13.9*  --   --   HGB 12.6* 12.8* 11.5*  HCT 40.0 41.2 37.8*  MCV 74.8* 75.7* 75.9*  PLT 207 200 185   Cardiac Enzymes:  Recent Labs Lab  02/24/16 1950 02/25/16 0157 02/25/16 0623 02/25/16 1245  TROPONINI 0.13* 0.03* 0.03* <0.03   BNP: BNP (last 3 results)  Recent Labs  02/24/16 1950  BNP 173.7*    ProBNP (last 3 results) No results for input(s): PROBNP in  the last 8760 hours.  CBG: No results for input(s): GLUCAP in the last 168 hours.     Signed:  Kelvin Cellar MD.  [facility] 02/26/2016, 11:52 AM

## 2016-02-27 LAB — URINE CULTURE: CULTURE: NO GROWTH

## 2016-03-12 DIAGNOSIS — Z6841 Body Mass Index (BMI) 40.0 and over, adult: Secondary | ICD-10-CM | POA: Diagnosis not present

## 2016-03-12 DIAGNOSIS — G4710 Hypersomnia, unspecified: Secondary | ICD-10-CM | POA: Diagnosis not present

## 2016-03-12 DIAGNOSIS — I4891 Unspecified atrial fibrillation: Secondary | ICD-10-CM | POA: Diagnosis not present

## 2016-03-12 DIAGNOSIS — Z299 Encounter for prophylactic measures, unspecified: Secondary | ICD-10-CM | POA: Diagnosis not present

## 2016-03-17 DIAGNOSIS — G4710 Hypersomnia, unspecified: Secondary | ICD-10-CM | POA: Diagnosis not present

## 2016-03-20 DIAGNOSIS — G4730 Sleep apnea, unspecified: Secondary | ICD-10-CM | POA: Diagnosis not present

## 2016-03-23 DIAGNOSIS — R972 Elevated prostate specific antigen [PSA]: Secondary | ICD-10-CM | POA: Diagnosis not present

## 2016-03-23 DIAGNOSIS — N401 Enlarged prostate with lower urinary tract symptoms: Secondary | ICD-10-CM | POA: Diagnosis not present

## 2016-03-23 DIAGNOSIS — R3129 Other microscopic hematuria: Secondary | ICD-10-CM | POA: Diagnosis not present

## 2016-03-23 NOTE — Progress Notes (Signed)
Cardiology Office Note    Date:  03/25/2016   ID:  Robert Warren, DOB 04/01/1946, MRN [id number]  PCP:  Robert Chroman, MD  Cardiologist:  Dr. Sallyanne Kuster  CC: post hosp f/u  History of Present Illness:  Robert Warren is a 70 y.o. male with a history of HTN, morbid obesity, HLD and recently diagnosed afib with RVR who presents to clinic for post hospital follow up.   On 02/24/16, he went to his local urgent care due to recent issues with frank hematuria and dysuria.  No chest pain or palpitations, but did have some SOB with exertion.  At the urgent care, vitals noted him to have pulse in 150s, therefore sent to [facility] ED.  In ED, ECG confirmed AFIB with RVR and started on diltiazem. On questioning, he stated that he was put on verapamil in the 1980s for a "fast beating heart" but he is unclear if that was for AFib or other SVT-- he had stayed on it since then.    Labs in Ed showed microcytic anemia, low Ca, proteinuria and + WBC, and Crt 1.5 (unk chronicity). Hematuria was felt to be 2/2 UTI with positive urinalysis and elevated white count. He was treated with Keflex. He spontaneously converted back into NSR and IV dilt converted to po metoprolol tart 100mg  BID. Troponin was mildly elevated 0.13--> 0.03--> 0.03. 2D ECHO showed normal LV function, no RWMAs, mod RV dilation and mod systolic RV dysfunction. It was felt that he likely has OSA and outpatient sleep study was recommended.   Today he presents to clinic for follow up. He went to his urologist yesterday and said there is trace blood in his urine. He is getting a urologic procedure tomorrow at [facility] but he is not sure what. He does have kidney stones on CT 02/25/16. He has been feeling tired and SOB lately- he thinks from some of the new medications.No chest pain. He does get dyspnea with exertion and was supposed get a stress test with PCP but then ended up being admitted so it was never completed.   He does have a family history of CAD. His  father had a MI in his 78s.    Past Medical History:  Diagnosis Date  . Atrial fibrillation ([place])   . Hypertension   . Shortness of breath     Past Surgical History:  Procedure Laterality Date  . Anal cyst Removal    . COLONOSCOPY N/A 06/20/2014   Procedure: COLONOSCOPY;  Surgeon: Rogene Houston, MD;  Location: [facility];  Service: Endoscopy;  Laterality: N/A;  1030    Current Medications: Outpatient Medications Prior to Visit  Medication Sig Dispense Refill  . apixaban (ELIQUIS) 5 MG TABS tablet Take 1 tablet (5 mg total) by mouth 2 (two) times daily. 60 tablet 3  . buPROPion (WELLBUTRIN SR) 200 MG 12 hr tablet Take 200 mg by mouth daily as needed (DEPRESSION).     Marland Kitchen clonazePAM (KLONOPIN) 0.5 MG tablet Take 0.5 mg by mouth daily as needed for anxiety.     . finasteride (PROSCAR) 5 MG tablet Take 5 mg by mouth at bedtime.    . meclizine (ANTIVERT) 25 MG tablet Take 25 mg by mouth daily as needed (vertigo).     . simvastatin (ZOCOR) 80 MG tablet Take 40 mg by mouth at bedtime.     . tamsulosin (FLOMAX) 0.4 MG CAPS capsule Take 0.4 mg by mouth at bedtime.     . traZODone (DESYREL)  50 MG tablet Take 50 mg by mouth at bedtime.    . metoprolol (LOPRESSOR) 100 MG tablet Take 1 tablet (100 mg total) by mouth 2 (two) times daily. 60 tablet 2  . cephALEXin (KEFLEX) 500 MG capsule Take 1 capsule (500 mg total) by mouth every 8 (eight) hours. 18 capsule 0   No facility-administered medications prior to visit.      Allergies:   Review of patient's allergies indicates no known allergies.   Social History   Social History  . Marital status: Married    Spouse name: N/A  . Number of children: N/A  . Years of education: N/A   Social History Main Topics  . Smoking status: Former Smoker    Packs/day: 1.00    Years: 30.00    Types: Cigarettes    Quit date: 08/23/1996  . Smokeless tobacco: None  . Alcohol use No  . Drug use: No  . Sexual activity: Not Asked   Other Topics  Concern  . None   Social History Narrative  . None     Family History:  The patient's family history includes Cancer - Lung in his father; Heart attack in his father; Heart disease in his mother.     ROS:   Please see the history of present illness.    ROS All other systems reviewed and are negative.   PHYSICAL EXAM:   VS:  BP 118/70   Pulse (!) 52   Ht 6\' 4"  (1.93 m)   Wt (!) 379 lb 12.8 oz (172.3 kg)   BMI 46.23 kg/m    GEN: Well nourished, well developed, in no acute distress morbidly obese HEENT: normal  Neck: no JVD, carotid bruits, or masses Cardiac: RRR; no murmurs, rubs, or gallops,no edema  Respiratory:  clear to auscultation bilaterally, normal work of breathing GI: soft, nontender, nondistended, + BS MS: no deformity or atrophy  Skin: warm and dry, no rash Neuro:  Alert and Oriented x 3, Strength and sensation are intact Psych: euthymic mood, full affect  Wt Readings from Last 3 Encounters:  03/25/16 (!) 379 lb 12.8 oz (172.3 kg)  02/26/16 (!) 374 lb 3.2 oz (169.7 kg)  06/20/14 (!) 370 lb (167.8 kg)     Studies/Labs Reviewed:   EKG:  EKG is ordered today.  The ekg ordered today demonstrates junctional rhythm HR 46.   Recent Labs: 02/24/2016: B Natriuretic Peptide 173.7 02/25/2016: TSH 1.968 02/26/2016: ALT 26; BUN 17; Creatinine, Ser 1.35; Hemoglobin 11.5; Magnesium 1.9; Platelets 185; Potassium 3.8; Sodium 140   Lipid Panel No results found for: CHOL, TRIG, HDL, CHOLHDL, VLDL, LDLCALC, LDLDIRECT  Additional studies/ records that were reviewed today include:  2D ECHO: 02/26/2016 LV EF: 55% -   60% Study Conclusions - Left ventricle: The cavity size was normal. There was moderate   concentric hypertrophy. Systolic function was normal. The   estimated ejection fraction was in the range of 55% to 60%. Wall   motion was normal; there were no regional wall motion   abnormalities. The transmitral flow pattern was normal. Left   ventricular diastolic function  parameters were normal. - Aortic valve: Trileaflet; mildly thickened, mildly calcified   leaflets. - Right ventricle: The cavity size was moderately dilated. Wall   thickness was normal. Systolic function was moderately reduced.   ASSESSMENT & PLAN:   PAF: presented with asymptomatic afib with RVR. Spontaneously converted to NSR. CHADSVSAC score of at least 2 and started on Eliquis. He is bradycardiac today  with HR in 40s on metoprolol 100 mg BID and complains of fatigue and SOB associated with this medication. Will decrease this to 50mg  BID. He will watch his BP at home and call us if this becomes elevated.  Hematuria: felt to be 2/2 UTI. Pilar Plate hematuria resolved with Keflex. Still with trace blood in urine. Getting procedure done by urology tomorrow although he is not exactly sure what   Probable OSA: did a home sleep study by PCP. He will let us know the result  HTN: BP well controlled on current regimen   CKD: unknown baseline. Creat 1.35 at discharge.   DOE: he was supposed to have a stress test for this before his admission but it was never done. He did have mild troponin leak c/w demand ischemia. With his RFs  (obesity, HTN, and family hx (father-MI in 49s)), we will obtain a lexiscan myovew. This will have to be a 2 day stress test due obesity  Morbid Obesity: Body mass index is 46.23 kg/m. He had some success on nutrisystem but it was too expensive. Counseled on diet and exercise.    Medication Adjustments/Labs and Tests Ordered: Current medicines are reviewed at length with the patient today.  Concerns regarding medicines are outlined above.  Medication changes, Labs and Tests ordered today are listed in the Patient Instructions below. Patient Instructions  Medication Instructions:  Your physician has recommended you make the following change in your medication:  1.  DECREASE the Lopressor to 50 mg taking 1 tablet twice a day   Labwork: None  ordered  Testing/Procedures: Your physician has requested that you have a lexiscan myoview. For further information please visit [email or web address]. Please follow instruction sheet, as given.   Follow-Up: Your physician recommends that you schedule a follow-up appointment in: [place]  Any Other Special Instructions Will Be Listed Below (If Applicable).  Pharmacologic Stress Electrocardiogram A pharmacologic stress electrocardiogram is a heart (cardiac) test that uses nuclear imaging to evaluate the blood supply to your heart. This test may also be called a pharmacologic stress electrocardiography. Pharmacologic means that a medicine is used to increase your heart rate and blood pressure.  This stress test is done to find areas of poor blood flow to the heart by determining the extent of coronary artery disease (CAD). Some people exercise on a treadmill, which naturally increases the blood flow to the heart. For those people unable to exercise on a treadmill, a medicine is used. This medicine stimulates your heart and will cause your heart to beat harder and more quickly, as if you were exercising.  Pharmacologic stress tests can help determine:  The adequacy of blood flow to your heart during increased levels of activity in order to clear you for discharge home.  The extent of coronary artery blockage caused by CAD.  Your prognosis if you have suffered a heart attack.  The effectiveness of cardiac procedures done, such as an angioplasty, which can increase the circulation in your coronary arteries.  Causes of chest pain or pressure. LET [facility] CARE PROVIDER KNOW ABOUT:  Any allergies you have.  All medicines you are taking, including vitamins, herbs, eye drops, creams, and over-the-counter medicines.  Previous problems you or members of your family have had with the use of anesthetics.  Any blood disorders you have.  Previous surgeries you have  had.  Medical conditions you have.  Possibility of pregnancy, if this applies.  If you are currently breastfeeding. RISKS AND COMPLICATIONS Generally,  this is a safe procedure. However, as with any procedure, complications can occur. Possible complications include:  You develop pain or pressure in the following areas:  Chest.  Jaw or neck.  Between your shoulder blades.  Radiating down your left arm.  Headache.  Dizziness or light-headedness.  Shortness of breath.  Increased or irregular heartbeat.  Low blood pressure.  Nausea or vomiting.  Flushing.  Redness going up the arm and slight pain during injection of medicine.  Heart attack (rare). BEFORE THE PROCEDURE   Avoid all forms of caffeine for 24 hours before your test or as directed by your health care provider. This includes coffee, tea (even decaffeinated tea), caffeinated sodas, chocolate, cocoa, and certain pain medicines.  Follow your health care provider's instructions regarding eating and drinking before the test.  Take your medicines as directed at regular times with water unless instructed otherwise. Exceptions may include:  If you have diabetes, ask how you are to take your insulin or pills. It is common to adjust insulin dosing the morning of the test.  If you are taking beta-blocker medicines, it is important to talk to your health care provider about these medicines well before the date of your test. Taking beta-blocker medicines may interfere with the test. In some cases, these medicines need to be changed or stopped 24 hours or more before the test.  If you wear a nitroglycerin patch, it may need to be removed prior to the test. Ask your health care provider if the patch should be removed before the test.  If you use an inhaler for any breathing condition, bring it with you to the test.  If you are an outpatient, bring a snack so you can eat right after the stress phase of the test.  Do not  smoke for 4 hours prior to the test or as directed by your health care provider.  Do not apply lotions, powders, creams, or oils on your chest prior to the test.  Wear comfortable shoes and clothing. Let your health care provider know if you were unable to complete or follow the preparations for your test. PROCEDURE   Multiple patches (electrodes) will be put on your chest. If needed, small areas of your chest may be shaved to get better contact with the electrodes. Once the electrodes are attached to your body, multiple wires will be attached to the electrodes, and your heart rate will be monitored.  An IV access will be started. A nuclear trace (isotope) is given. The isotope may be given intravenously, or it may be swallowed. Nuclear refers to several types of radioactive isotopes, and the nuclear isotope lights up the arteries so that the nuclear images are clear. The isotope is absorbed by your body. This results in low radiation exposure.  A resting nuclear image is taken to show how your heart functions at rest.  A medicine is given through the IV access.  A second scan is done about 1 hour after the medicine injection and determines how your heart functions under stress.  During this stress phase, you will be connected to an electrocardiogram machine. Your blood pressure and oxygen levels will be monitored. AFTER THE PROCEDURE   Your heart rate and blood pressure will be monitored after the test.  You may return to your normal schedule, including diet,activities, and medicines, unless your health care provider tells you otherwise.   This information is not intended to replace advice given to you by your health care provider. Make  sure you discuss any questions you have with your health care provider.   Document Released: 12/27/2008 Document Revised: 08/15/2013 Document Reviewed: 04/17/2013 Elsevier Interactive Patient Education [employer].   If you need a refill on  your cardiac medications before your next appointment, please call your pharmacy.      Signed, Angelena Form, PA-C  03/25/2016 4:25 PM    [facility] [address], [place], [place]  [address] Phone: [phone]; Fax: [phone]

## 2016-03-25 ENCOUNTER — Encounter (INDEPENDENT_AMBULATORY_CARE_PROVIDER_SITE_OTHER): Payer: Self-pay

## 2016-03-25 ENCOUNTER — Telehealth (HOSPITAL_COMMUNITY): Payer: Self-pay | Admitting: *Deleted

## 2016-03-25 ENCOUNTER — Ambulatory Visit (INDEPENDENT_AMBULATORY_CARE_PROVIDER_SITE_OTHER): Payer: Medicare Other | Admitting: Physician Assistant

## 2016-03-25 ENCOUNTER — Encounter: Payer: Self-pay | Admitting: Physician Assistant

## 2016-03-25 VITALS — BP 118/70 | HR 52 | Ht 76.0 in | Wt 379.8 lb

## 2016-03-25 DIAGNOSIS — Z09 Encounter for follow-up examination after completed treatment for conditions other than malignant neoplasm: Secondary | ICD-10-CM

## 2016-03-25 DIAGNOSIS — I480 Paroxysmal atrial fibrillation: Secondary | ICD-10-CM | POA: Diagnosis not present

## 2016-03-25 DIAGNOSIS — E785 Hyperlipidemia, unspecified: Secondary | ICD-10-CM

## 2016-03-25 DIAGNOSIS — I10 Essential (primary) hypertension: Secondary | ICD-10-CM | POA: Diagnosis not present

## 2016-03-25 DIAGNOSIS — R0602 Shortness of breath: Secondary | ICD-10-CM | POA: Diagnosis not present

## 2016-03-25 DIAGNOSIS — N189 Chronic kidney disease, unspecified: Secondary | ICD-10-CM

## 2016-03-25 MED ORDER — METOPROLOL TARTRATE 50 MG PO TABS: 50.0000 mg | ORAL_TABLET | Freq: Two times a day (BID) | ORAL | 1 refills | Status: DC

## 2016-03-25 NOTE — Telephone Encounter (Signed)
Patient given detailed instructions per Myocardial Perfusion Study Information Sheet for the test on 03/31/16 at 1230. Patient notified to arrive 15 minutes early and that it is imperative to arrive on time for appointment to keep from having the test rescheduled.  If you need to cancel or reschedule your appointment, please call the office within 24 hours of your appointment. Failure to do so may result in a cancellation of your appointment, and a $50 no show fee. Patient verbalized understanding.Branon Sabine, Ranae Palms

## 2016-03-25 NOTE — Patient Instructions (Addendum)
Medication Instructions:  Your physician has recommended you make the following change in your medication:  1.  DECREASE the Lopressor to 50 mg taking 1 tablet twice a day   Labwork: None ordered  Testing/Procedures: Your physician has requested that you have a lexiscan myoview. For further information please visit [email or web address]. Please follow instruction sheet, as given.   Follow-Up: Your physician recommends that you schedule a follow-up appointment in: [place]  Any Other Special Instructions Will Be Listed Below (If Applicable).  Pharmacologic Stress Electrocardiogram A pharmacologic stress electrocardiogram is a heart (cardiac) test that uses nuclear imaging to evaluate the blood supply to your heart. This test may also be called a pharmacologic stress electrocardiography. Pharmacologic means that a medicine is used to increase your heart rate and blood pressure.  This stress test is done to find areas of poor blood flow to the heart by determining the extent of coronary artery disease (CAD). Some people exercise on a treadmill, which naturally increases the blood flow to the heart. For those people unable to exercise on a treadmill, a medicine is used. This medicine stimulates your heart and will cause your heart to beat harder and more quickly, as if you were exercising.  Pharmacologic stress tests can help determine:  The adequacy of blood flow to your heart during increased levels of activity in order to clear you for discharge home.  The extent of coronary artery blockage caused by CAD.  Your prognosis if you have suffered a heart attack.  The effectiveness of cardiac procedures done, such as an angioplasty, which can increase the circulation in your coronary arteries.  Causes of chest pain or pressure. LET [facility] CARE PROVIDER KNOW ABOUT:  Any allergies you have.  All medicines you are taking, including vitamins, herbs, eye drops, creams, and  over-the-counter medicines.  Previous problems you or members of your family have had with the use of anesthetics.  Any blood disorders you have.  Previous surgeries you have had.  Medical conditions you have.  Possibility of pregnancy, if this applies.  If you are currently breastfeeding. RISKS AND COMPLICATIONS Generally, this is a safe procedure. However, as with any procedure, complications can occur. Possible complications include:  You develop pain or pressure in the following areas:  Chest.  Jaw or neck.  Between your shoulder blades.  Radiating down your left arm.  Headache.  Dizziness or light-headedness.  Shortness of breath.  Increased or irregular heartbeat.  Low blood pressure.  Nausea or vomiting.  Flushing.  Redness going up the arm and slight pain during injection of medicine.  Heart attack (rare). BEFORE THE PROCEDURE   Avoid all forms of caffeine for 24 hours before your test or as directed by your health care provider. This includes coffee, tea (even decaffeinated tea), caffeinated sodas, chocolate, cocoa, and certain pain medicines.  Follow your health care provider's instructions regarding eating and drinking before the test.  Take your medicines as directed at regular times with water unless instructed otherwise. Exceptions may include:  If you have diabetes, ask how you are to take your insulin or pills. It is common to adjust insulin dosing the morning of the test.  If you are taking beta-blocker medicines, it is important to talk to your health care provider about these medicines well before the date of your test. Taking beta-blocker medicines may interfere with the test. In some cases, these medicines need to be changed or stopped 24 hours or more before the  test.  If you wear a nitroglycerin patch, it may need to be removed prior to the test. Ask your health care provider if the patch should be removed before the test.  If you use an  inhaler for any breathing condition, bring it with you to the test.  If you are an outpatient, bring a snack so you can eat right after the stress phase of the test.  Do not smoke for 4 hours prior to the test or as directed by your health care provider.  Do not apply lotions, powders, creams, or oils on your chest prior to the test.  Wear comfortable shoes and clothing. Let your health care provider know if you were unable to complete or follow the preparations for your test. PROCEDURE   Multiple patches (electrodes) will be put on your chest. If needed, small areas of your chest may be shaved to get better contact with the electrodes. Once the electrodes are attached to your body, multiple wires will be attached to the electrodes, and your heart rate will be monitored.  An IV access will be started. A nuclear trace (isotope) is given. The isotope may be given intravenously, or it may be swallowed. Nuclear refers to several types of radioactive isotopes, and the nuclear isotope lights up the arteries so that the nuclear images are clear. The isotope is absorbed by your body. This results in low radiation exposure.  A resting nuclear image is taken to show how your heart functions at rest.  A medicine is given through the IV access.  A second scan is done about 1 hour after the medicine injection and determines how your heart functions under stress.  During this stress phase, you will be connected to an electrocardiogram machine. Your blood pressure and oxygen levels will be monitored. AFTER THE PROCEDURE   Your heart rate and blood pressure will be monitored after the test.  You may return to your normal schedule, including diet,activities, and medicines, unless your health care provider tells you otherwise.   This information is not intended to replace advice given to you by your health care provider. Make sure you discuss any questions you have with your health care provider.    Document Released: 12/27/2008 Document Revised: 08/15/2013 Document Reviewed: 04/17/2013 Elsevier Interactive Patient Education [employer].   If you need a refill on your cardiac medications before your next appointment, please call your pharmacy.

## 2016-03-26 DIAGNOSIS — N289 Disorder of kidney and ureter, unspecified: Secondary | ICD-10-CM | POA: Diagnosis not present

## 2016-03-26 DIAGNOSIS — R3129 Other microscopic hematuria: Secondary | ICD-10-CM | POA: Diagnosis not present

## 2016-03-26 DIAGNOSIS — N400 Enlarged prostate without lower urinary tract symptoms: Secondary | ICD-10-CM | POA: Diagnosis not present

## 2016-03-26 DIAGNOSIS — N200 Calculus of kidney: Secondary | ICD-10-CM | POA: Diagnosis not present

## 2016-03-26 DIAGNOSIS — K439 Ventral hernia without obstruction or gangrene: Secondary | ICD-10-CM | POA: Diagnosis not present

## 2016-03-31 ENCOUNTER — Ambulatory Visit (HOSPITAL_COMMUNITY): Payer: Medicare Other | Attending: Cardiology

## 2016-03-31 DIAGNOSIS — I10 Essential (primary) hypertension: Secondary | ICD-10-CM | POA: Diagnosis not present

## 2016-03-31 DIAGNOSIS — Z8249 Family history of ischemic heart disease and other diseases of the circulatory system: Secondary | ICD-10-CM | POA: Insufficient documentation

## 2016-03-31 DIAGNOSIS — R5383 Other fatigue: Secondary | ICD-10-CM | POA: Diagnosis not present

## 2016-03-31 DIAGNOSIS — R0602 Shortness of breath: Secondary | ICD-10-CM | POA: Diagnosis not present

## 2016-03-31 DIAGNOSIS — R002 Palpitations: Secondary | ICD-10-CM | POA: Insufficient documentation

## 2016-03-31 MED ORDER — REGADENOSON 0.4 MG/5ML IV SOLN
0.4000 mg | Freq: Once | INTRAVENOUS | Status: AC
  Administered 2016-03-31: 0.4 mg via INTRAVENOUS

## 2016-03-31 MED ORDER — TECHNETIUM TC 99M TETROFOSMIN IV KIT
32.0000 | PACK | Freq: Once | INTRAVENOUS | Status: AC | PRN
  Administered 2016-03-31: 32 via INTRAVENOUS
  Filled 2016-03-31: qty 32

## 2016-04-01 ENCOUNTER — Ambulatory Visit (HOSPITAL_COMMUNITY): Payer: Medicare Other | Attending: Cardiology

## 2016-04-01 LAB — MYOCARDIAL PERFUSION IMAGING
CHL CUP NUCLEAR SDS: 0
CHL CUP NUCLEAR SSS: 0
CSEPPHR: 49 {beats}/min
LVDIAVOL: 203 mL (ref 62–150)
LVSYSVOL: 110 mL
RATE: 0.42
Rest HR: 46 {beats}/min
SRS: 0
TID: 0.92

## 2016-04-01 MED ORDER — TECHNETIUM TC 99M TETROFOSMIN IV KIT
32.7000 | PACK | Freq: Once | INTRAVENOUS | Status: AC | PRN
  Administered 2016-04-01: 32.7 via INTRAVENOUS
  Filled 2016-04-01: qty 33

## 2016-04-02 ENCOUNTER — Telehealth: Payer: Self-pay | Admitting: Cardiovascular Disease

## 2016-04-02 NOTE — Telephone Encounter (Signed)
New message    Pt is returning call for rn about test results

## 2016-04-02 NOTE — Telephone Encounter (Signed)
Returned patient call:  Made aware of stress test results.  Notes Recorded by Eileen Stanford, PA-C on 04/01/2016 at 8:07 PM EDT Stress test was low risk. It showed that his pumping function was a little decreased but they did a recent echo in the hospital that said EF was normal, so nothing to worry about. No change in plans.  Pt verbalized understanding and denies further questions/concerns at this time.

## 2016-04-07 DIAGNOSIS — N200 Calculus of kidney: Secondary | ICD-10-CM | POA: Diagnosis not present

## 2016-04-07 DIAGNOSIS — R3129 Other microscopic hematuria: Secondary | ICD-10-CM | POA: Diagnosis not present

## 2016-04-07 DIAGNOSIS — N2889 Other specified disorders of kidney and ureter: Secondary | ICD-10-CM | POA: Diagnosis not present

## 2016-04-07 DIAGNOSIS — N3289 Other specified disorders of bladder: Secondary | ICD-10-CM | POA: Diagnosis not present

## 2016-04-08 ENCOUNTER — Other Ambulatory Visit: Payer: Self-pay | Admitting: Urology

## 2016-04-08 DIAGNOSIS — N2889 Other specified disorders of kidney and ureter: Secondary | ICD-10-CM

## 2016-04-16 DIAGNOSIS — I4891 Unspecified atrial fibrillation: Secondary | ICD-10-CM | POA: Diagnosis not present

## 2016-04-16 DIAGNOSIS — I10 Essential (primary) hypertension: Secondary | ICD-10-CM | POA: Diagnosis not present

## 2016-04-16 DIAGNOSIS — M159 Polyosteoarthritis, unspecified: Secondary | ICD-10-CM | POA: Diagnosis not present

## 2016-04-17 ENCOUNTER — Ambulatory Visit
Admission: RE | Admit: 2016-04-17 | Discharge: 2016-04-17 | Disposition: A | Discharge status: Home / Self Care | Payer: Medicare Other | Source: Ambulatory Visit | Attending: Urology | Admitting: Urology

## 2016-04-17 DIAGNOSIS — N281 Cyst of kidney, acquired: Secondary | ICD-10-CM | POA: Diagnosis not present

## 2016-04-17 DIAGNOSIS — N2889 Other specified disorders of kidney and ureter: Secondary | ICD-10-CM

## 2016-04-17 MED ORDER — GADOBENATE DIMEGLUMINE 529 MG/ML IV SOLN
20.0000 mL | Freq: Once | INTRAVENOUS | Status: AC | PRN
  Administered 2016-04-17: 20 mL via INTRAVENOUS

## 2016-04-28 DIAGNOSIS — I4891 Unspecified atrial fibrillation: Secondary | ICD-10-CM | POA: Diagnosis not present

## 2016-04-28 DIAGNOSIS — I10 Essential (primary) hypertension: Secondary | ICD-10-CM | POA: Diagnosis not present

## 2016-04-28 DIAGNOSIS — N400 Enlarged prostate without lower urinary tract symptoms: Secondary | ICD-10-CM | POA: Diagnosis not present

## 2016-05-12 DIAGNOSIS — R3129 Other microscopic hematuria: Secondary | ICD-10-CM | POA: Diagnosis not present

## 2016-05-12 DIAGNOSIS — N200 Calculus of kidney: Secondary | ICD-10-CM | POA: Diagnosis not present

## 2016-05-22 ENCOUNTER — Telehealth: Payer: Self-pay

## 2016-05-22 NOTE — Telephone Encounter (Signed)
Request for surgical clearance:   1. What type surgery is being performed? "small mass on the intravesicle prostate that needs to be resected and an 75mm kidney stone that needs to be treated"  2. When is this surgery scheduled? pending  3. Are there any medications that need to be held prior to surgery and how long? Eliquis 5 mg BID; 7 days prior  4. Name of the physician performing surgery: Dr Clyde Lundborg  5. What is the office phone and fax number?   Phone [phone]  Fax [phone]

## 2016-05-25 ENCOUNTER — Encounter: Payer: Self-pay | Admitting: Cardiovascular Disease

## 2016-05-25 NOTE — Telephone Encounter (Signed)
Sent via Epic

## 2016-05-28 NOTE — Telephone Encounter (Signed)
Faxed to Laren Boom at [facility].

## 2016-06-08 DIAGNOSIS — N3289 Other specified disorders of bladder: Secondary | ICD-10-CM | POA: Diagnosis not present

## 2016-06-08 DIAGNOSIS — N200 Calculus of kidney: Secondary | ICD-10-CM | POA: Diagnosis not present

## 2016-06-11 DIAGNOSIS — Z23 Encounter for immunization: Secondary | ICD-10-CM | POA: Diagnosis not present

## 2016-06-22 DIAGNOSIS — Z538 Procedure and treatment not carried out for other reasons: Secondary | ICD-10-CM | POA: Diagnosis not present

## 2016-06-22 DIAGNOSIS — N200 Calculus of kidney: Secondary | ICD-10-CM | POA: Diagnosis not present

## 2016-06-24 DIAGNOSIS — Z538 Procedure and treatment not carried out for other reasons: Secondary | ICD-10-CM | POA: Diagnosis not present

## 2016-06-24 DIAGNOSIS — N200 Calculus of kidney: Secondary | ICD-10-CM | POA: Diagnosis not present

## 2016-06-25 ENCOUNTER — Ambulatory Visit: Payer: Medicare Other | Admitting: Cardiovascular Disease

## 2016-07-03 DIAGNOSIS — N400 Enlarged prostate without lower urinary tract symptoms: Secondary | ICD-10-CM | POA: Diagnosis not present

## 2016-07-03 DIAGNOSIS — N281 Cyst of kidney, acquired: Secondary | ICD-10-CM | POA: Diagnosis not present

## 2016-07-03 DIAGNOSIS — N200 Calculus of kidney: Secondary | ICD-10-CM | POA: Diagnosis not present

## 2016-07-10 DIAGNOSIS — Z1211 Encounter for screening for malignant neoplasm of colon: Secondary | ICD-10-CM | POA: Diagnosis not present

## 2016-07-10 DIAGNOSIS — Z0000 Encounter for general adult medical examination without abnormal findings: Secondary | ICD-10-CM | POA: Diagnosis not present

## 2016-07-10 DIAGNOSIS — Z7189 Other specified counseling: Secondary | ICD-10-CM | POA: Diagnosis not present

## 2016-07-10 DIAGNOSIS — Z299 Encounter for prophylactic measures, unspecified: Secondary | ICD-10-CM | POA: Diagnosis not present

## 2016-07-10 DIAGNOSIS — Z1389 Encounter for screening for other disorder: Secondary | ICD-10-CM | POA: Diagnosis not present

## 2016-07-10 DIAGNOSIS — R5383 Other fatigue: Secondary | ICD-10-CM | POA: Diagnosis not present

## 2016-07-10 DIAGNOSIS — E7800 Pure hypercholesterolemia, unspecified: Secondary | ICD-10-CM | POA: Diagnosis not present

## 2016-07-10 DIAGNOSIS — Z125 Encounter for screening for malignant neoplasm of prostate: Secondary | ICD-10-CM | POA: Diagnosis not present

## 2016-07-10 DIAGNOSIS — Z79899 Other long term (current) drug therapy: Secondary | ICD-10-CM | POA: Diagnosis not present

## 2016-07-22 ENCOUNTER — Telehealth: Payer: Self-pay | Admitting: Cardiovascular Disease

## 2016-07-22 NOTE — Telephone Encounter (Signed)
This was done in September. Pt states that they had to cancel but nothing has changed he will call Urology and see if they need anything new and call back.

## 2016-07-22 NOTE — Telephone Encounter (Signed)
New message     Pt verbalized that he is calling to see if we got the approval for surgical clearance

## 2016-07-28 DIAGNOSIS — R001 Bradycardia, unspecified: Secondary | ICD-10-CM | POA: Diagnosis not present

## 2016-07-28 DIAGNOSIS — Z7901 Long term (current) use of anticoagulants: Secondary | ICD-10-CM | POA: Diagnosis not present

## 2016-07-28 DIAGNOSIS — N400 Enlarged prostate without lower urinary tract symptoms: Secondary | ICD-10-CM | POA: Diagnosis not present

## 2016-07-28 DIAGNOSIS — I10 Essential (primary) hypertension: Secondary | ICD-10-CM | POA: Diagnosis not present

## 2016-07-28 DIAGNOSIS — N200 Calculus of kidney: Secondary | ICD-10-CM | POA: Diagnosis not present

## 2016-07-28 DIAGNOSIS — Z87442 Personal history of urinary calculi: Secondary | ICD-10-CM | POA: Diagnosis not present

## 2016-07-28 DIAGNOSIS — E785 Hyperlipidemia, unspecified: Secondary | ICD-10-CM | POA: Diagnosis not present

## 2016-07-28 DIAGNOSIS — Z79899 Other long term (current) drug therapy: Secondary | ICD-10-CM | POA: Diagnosis not present

## 2016-07-28 DIAGNOSIS — Z87891 Personal history of nicotine dependence: Secondary | ICD-10-CM | POA: Diagnosis not present

## 2016-07-28 DIAGNOSIS — N2889 Other specified disorders of kidney and ureter: Secondary | ICD-10-CM | POA: Diagnosis not present

## 2016-07-28 DIAGNOSIS — Z9989 Dependence on other enabling machines and devices: Secondary | ICD-10-CM | POA: Diagnosis not present

## 2016-07-28 DIAGNOSIS — G4733 Obstructive sleep apnea (adult) (pediatric): Secondary | ICD-10-CM | POA: Diagnosis not present

## 2016-07-28 DIAGNOSIS — I4891 Unspecified atrial fibrillation: Secondary | ICD-10-CM | POA: Diagnosis not present

## 2016-07-30 DIAGNOSIS — M159 Polyosteoarthritis, unspecified: Secondary | ICD-10-CM | POA: Diagnosis not present

## 2016-07-30 DIAGNOSIS — I10 Essential (primary) hypertension: Secondary | ICD-10-CM | POA: Diagnosis not present

## 2016-07-30 DIAGNOSIS — I4891 Unspecified atrial fibrillation: Secondary | ICD-10-CM | POA: Diagnosis not present

## 2016-08-05 DIAGNOSIS — N400 Enlarged prostate without lower urinary tract symptoms: Secondary | ICD-10-CM | POA: Diagnosis not present

## 2016-08-05 DIAGNOSIS — N200 Calculus of kidney: Secondary | ICD-10-CM | POA: Diagnosis not present

## 2016-08-25 DIAGNOSIS — I4891 Unspecified atrial fibrillation: Secondary | ICD-10-CM | POA: Diagnosis not present

## 2016-08-25 DIAGNOSIS — G4730 Sleep apnea, unspecified: Secondary | ICD-10-CM | POA: Diagnosis not present

## 2016-08-25 DIAGNOSIS — I10 Essential (primary) hypertension: Secondary | ICD-10-CM | POA: Diagnosis not present

## 2016-08-27 ENCOUNTER — Encounter: Payer: Self-pay | Admitting: Cardiovascular Disease

## 2016-08-27 ENCOUNTER — Ambulatory Visit (INDEPENDENT_AMBULATORY_CARE_PROVIDER_SITE_OTHER): Payer: Medicare Other | Admitting: Cardiovascular Disease

## 2016-08-27 VITALS — BP 100/65 | HR 48 | Ht 76.0 in | Wt 384.0 lb

## 2016-08-27 DIAGNOSIS — N200 Calculus of kidney: Secondary | ICD-10-CM

## 2016-08-27 DIAGNOSIS — R001 Bradycardia, unspecified: Secondary | ICD-10-CM

## 2016-08-27 DIAGNOSIS — E785 Hyperlipidemia, unspecified: Secondary | ICD-10-CM | POA: Insufficient documentation

## 2016-08-27 DIAGNOSIS — I480 Paroxysmal atrial fibrillation: Secondary | ICD-10-CM

## 2016-08-27 DIAGNOSIS — G4733 Obstructive sleep apnea (adult) (pediatric): Secondary | ICD-10-CM | POA: Diagnosis not present

## 2016-08-27 DIAGNOSIS — E782 Mixed hyperlipidemia: Secondary | ICD-10-CM | POA: Insufficient documentation

## 2016-08-27 MED ORDER — METOPROLOL TARTRATE 25 MG PO TABS: 25.0000 mg | ORAL_TABLET | Freq: Two times a day (BID) | ORAL | 3 refills | Status: DC

## 2016-08-27 MED ORDER — METOPROLOL TARTRATE 25 MG PO TABS
25.0000 mg | ORAL_TABLET | Freq: Two times a day (BID) | ORAL | 3 refills | Status: DC
Start: 2016-08-27 — End: 2016-09-15

## 2016-08-27 NOTE — Patient Instructions (Signed)
Dr Sallyanne Kuster has recommended making the following medication changes: 1. DECREASE Metoprolol to 25 mg twice daily  Please schedule an EKG visit in 3-4 weeks. Schedule on a day that Dr C is in the office.  Dr Sallyanne Kuster recommends that you schedule a follow-up appointment in 3 months.  If you need a refill on your cardiac medications before your next appointment, please call your pharmacy.

## 2016-08-27 NOTE — Progress Notes (Signed)
Cardiology Office Note    Date:  08/27/2016   ID:  Robert Warren, DOB [birth year], MRN [id number]  PCP:  Robert Chroman, MD  Cardiologist:   Robert Klein, MD   Chief Complaint  Patient presents with  . Follow-up    needs to discuss meds, needs medical clearance-for eliquis, no chest pain, no other complaints    History of Present Illness:  Robert Warren is a 71 y.o. male who presented with atrial fibrillation with rapid ventricular response rapidly 6 months ago. Since that time he has been managed with anticoagulants and rate control medications. He underwent a sleep study that confirmed strong clinical suspicion of obstructive sleep apnea and is now using CPAP regularly. He has already noticed marked improvement in his level of alertness and energy when he wakes up in the morning. His echo showed evidence of cor pulmonale, but left ventricular systolic function was normal. He did not have any evidence of ischemia on a nuclear stress test. He takes a statin for hyperlipidemia and has a history of hypertension which is well compensated. He is morbidly obese.  He denies angina or dyspnea on exertion. Sometimes while at rest to feel mild palpitations in the retrosternal area associated with mild shortness of breath. This does not prevent him from performing usual activities. He has not had syncope or dizziness but does often complain of fatigue. He is tolerating anticoagulation without any bleeding problems. He has not had recent hematuria but needs to undergo cystoscopic ureteral stone retrieval with Dr. Roby Warren, his urologist in [place]. He will have to stop his anticoagulation for that procedure.  His medication list contains both simvastatin and rosuvastatin. I think this is just an error. He should not take simvastatin while on verapamil.  Past Medical History:  Diagnosis Date  . Atrial fibrillation ([place])   . Hypertension   . Shortness of breath     Past Surgical  History:  Procedure Laterality Date  . Anal cyst Removal    . COLONOSCOPY N/A 06/20/2014   Procedure: COLONOSCOPY;  Surgeon: Robert Houston, MD;  [facility];  Service: Endoscopy;  Laterality: N/A;  1030    Current Medications: Outpatient Medications Prior to Visit  Medication Sig Dispense Refill  . apixaban (ELIQUIS) 5 MG TABS tablet Take 1 tablet (5 mg total) by mouth 2 (two) times daily. 60 tablet 3  . buPROPion (WELLBUTRIN SR) 200 MG 12 hr tablet Take 200 mg by mouth 2 (two) times daily.     . clonazePAM (KLONOPIN) 0.5 MG tablet Take 0.5 mg by mouth 2 (two) times daily as needed for anxiety.     . finasteride (PROSCAR) 5 MG tablet Take 5 mg by mouth at bedtime.    . meclizine (ANTIVERT) 25 MG tablet Take 25 mg by mouth daily as needed (vertigo).     . simvastatin (ZOCOR) 80 MG tablet Take 40 mg by mouth at bedtime.     . tamsulosin (FLOMAX) 0.4 MG CAPS capsule Take 0.4 mg by mouth 2 (two) times daily.     . traZODone (DESYREL) 50 MG tablet Take 50 mg by mouth at bedtime.    . metoprolol (LOPRESSOR) 50 MG tablet Take 1 tablet (50 mg total) by mouth 2 (two) times daily. 180 tablet 1   No facility-administered medications prior to visit.      Allergies:   Patient has no known allergies.   Social History   Social History  . Marital status: Married  Spouse name: N/A  . Number of children: N/A  . Years of education: N/A   Social History Main Topics  . Smoking status: Former Smoker    Packs/day: 1.00    Years: 30.00    Types: Cigarettes    Quit date: 08/23/1996  . Smokeless tobacco: None  . Alcohol use No  . Drug use: No  . Sexual activity: Not Asked   Other Topics Concern  . None   Social History Narrative  . None     Family History:  The patient's family history includes Cancer - Lung in his father; Heart attack in his father; Heart disease in his mother.   ROS:   Please see the history of present illness.    ROS All other systems reviewed and are  negative.   PHYSICAL EXAM:   VS:  BP 100/65 (BP Location: Left Arm, Patient Position: Sitting, Cuff Size: Normal)   Pulse (!) 48   Ht 6' 4"  (1.93 m)   Wt (!) 384 lb (174.2 kg)   BMI 46.74 kg/m    GEN: Well nourished, well developed, in no acute distress.Morbid obesity  HEENT: normal  Neck: no JVD, carotid bruits, or masses Cardiac: RRR; no murmurs, rubs, or gallops,no edema  Respiratory:  clear to auscultation bilaterally, normal work of breathing GI: soft, nontender, nondistended, + BS MS: no deformity or atrophy  Skin: warm and dry, no rash Neuro:  Alert and Oriented x 3, Strength and sensation are intact Psych: euthymic mood, full affect  Wt Readings from Last 3 Encounters:  08/27/16 (!) 384 lb (174.2 kg)  03/31/16 (!) 379 lb (171.9 kg)  03/25/16 (!) 379 lb 12.8 oz (172.3 kg)      Studies/Labs Reviewed:   EKG:  EKG is ordered today.  The ekg ordered today demonstrates Junctional bradycardia at 48 bpm, QTC 430 ms.  Recent Labs: 02/24/2016: B Natriuretic Peptide 173.7 02/25/2016: TSH 1.968 02/26/2016: ALT 26; BUN 17; Creatinine, Ser 1.35; Hemoglobin 11.5; Magnesium 1.9; Platelets 185; Potassium 3.8; Sodium 140     ASSESSMENT:    1. Paroxysmal atrial fibrillation (H[facility])   2. Junctional bradycardia   3. OSA (obstructive sleep apnea)   4. Morbid obesity ([place])   5. Mixed hyperlipidemia   6. Nephrolithiasis      PLAN:  In order of problems listed above:  1. Afib: I suspect that he continues to have brief episodes of paroxysmal atrial fibrillation explaining his occasional palpitations and resting dyspnea. When I initially met [name] my impression was that he might have permanent atrial fibrillation, but this is clearly not the case. While he needs to continue anticoagulation lifelong for stroke prevention, I think it is safe to temporarily interrupt anticoagulant for his planned urological procedure. 2. Bradycardia: This is secondary to combined use of metoprolol and  verapamil. Will cut the metoprolol dose in half. He will have another electrocardiogram probably before his urological surgery and we will retrieve that copy to make sure his rhythm has returned to normal. If an electrocardiogram is not performed, will have him come back to the office towards the end of this month. He seems to be tolerating the bradycardia quite well. At this point, I think we simply will change medicines and there is no indication for pacemaker therapy. 3. OSA: Congratulated him for his compliance with CPAP. 4. Morbid obesity: Strongly encouraged to lose weight, primarily via calorie restriction and regular walking. 5. HLP: On statin. Need to confirm that he has stopped his simvastatin.  6. Nephrolithiasis: Okay to stop his anticoagulant for 2-3 hours before planned cystoscopy    Medication Adjustments/Labs and Tests Ordered: Current medicines are reviewed at length with the patient today.  Concerns regarding medicines are outlined above.  Medication changes, Labs and Tests ordered today are listed in the Patient Instructions below. Patient Instructions  Dr Sallyanne Kuster has recommended making the following medication changes: 1. DECREASE Metoprolol to 25 mg twice daily  Please schedule an EKG visit in 3-4 weeks. Schedule on a day that Dr C is in the office.  Dr Sallyanne Kuster recommends that you schedule a follow-up appointment in 3 months.  If you need a refill on your cardiac medications before your next appointment, please call your pharmacy.    Signed, Robert Klein, MD  08/27/2016 3:18 PM    [facility], [place], [place]  [address] Phone: [phone]; Fax: [phone]

## 2016-09-02 ENCOUNTER — Telehealth: Payer: Self-pay | Admitting: Cardiovascular Disease

## 2016-09-02 NOTE — Telephone Encounter (Signed)
Note reviewed - med chgs at most recent visit.  Faxed last OV note to [place] number provided, pt aware and voiced thanks for assistance.

## 2016-09-02 NOTE — Telephone Encounter (Signed)
°  [place]  Pt states [facility] pharmacy is requesting a note from Dr. Loletha Grayer stating that his metoprolol tartrate (LOPRESSOR) 25 MG tablet medication dosage was changed from 50 mg to 25 mg.  Fax to: [phone] Attn: Dr. Romero Liner

## 2016-09-07 DIAGNOSIS — I4891 Unspecified atrial fibrillation: Secondary | ICD-10-CM | POA: Diagnosis not present

## 2016-09-07 DIAGNOSIS — I10 Essential (primary) hypertension: Secondary | ICD-10-CM | POA: Diagnosis not present

## 2016-09-07 DIAGNOSIS — M159 Polyosteoarthritis, unspecified: Secondary | ICD-10-CM | POA: Diagnosis not present

## 2016-09-15 ENCOUNTER — Ambulatory Visit (INDEPENDENT_AMBULATORY_CARE_PROVIDER_SITE_OTHER): Payer: Medicare Other

## 2016-09-15 VITALS — BP 140/86 | HR 44 | Ht 76.0 in | Wt 384.0 lb

## 2016-09-15 DIAGNOSIS — I4891 Unspecified atrial fibrillation: Secondary | ICD-10-CM

## 2016-09-15 NOTE — Progress Notes (Signed)
1.) Reason for visit:EKG  2.) Name of MD requesting visit: Dr.Croitoru  3.) H&P: Metoprolol decreased to 25 mg twice a day on 08/27/16  4.) ROS related to problem: No complaints  5.) Assessment and plan per MD: Decrease Metoprolol to 12.5 mg twice a day. F/U in 3 months

## 2016-09-15 NOTE — Patient Instructions (Addendum)
Decrease Metoprolol to 12.5 mg twice a day    Good Luck with prostate surgery    Your physician recommends that you schedule a follow-up appointment Tuesday 11/24/16 at 9:55 am

## 2016-09-17 DIAGNOSIS — N401 Enlarged prostate with lower urinary tract symptoms: Secondary | ICD-10-CM | POA: Diagnosis not present

## 2016-09-17 DIAGNOSIS — Z6841 Body Mass Index (BMI) 40.0 and over, adult: Secondary | ICD-10-CM | POA: Diagnosis not present

## 2016-09-17 DIAGNOSIS — N138 Other obstructive and reflux uropathy: Secondary | ICD-10-CM | POA: Diagnosis not present

## 2016-09-18 ENCOUNTER — Telehealth: Payer: Self-pay

## 2016-09-18 DIAGNOSIS — Z6841 Body Mass Index (BMI) 40.0 and over, adult: Secondary | ICD-10-CM | POA: Diagnosis not present

## 2016-09-18 DIAGNOSIS — N401 Enlarged prostate with lower urinary tract symptoms: Secondary | ICD-10-CM | POA: Diagnosis not present

## 2016-09-18 DIAGNOSIS — N138 Other obstructive and reflux uropathy: Secondary | ICD-10-CM | POA: Diagnosis not present

## 2016-09-18 NOTE — Telephone Encounter (Signed)
lmtcb

## 2016-09-18 NOTE — Telephone Encounter (Signed)
-----   Message from Sanda Klein, MD sent at 08/27/2016  2:59 PM EST ----- These call him to confirm that he is not taking both simvastatin and rosuvastatin. Simvastatin should be stopped due to the interaction with verapamil.

## 2016-09-23 DIAGNOSIS — N401 Enlarged prostate with lower urinary tract symptoms: Secondary | ICD-10-CM | POA: Diagnosis not present

## 2016-09-23 DIAGNOSIS — R972 Elevated prostate specific antigen [PSA]: Secondary | ICD-10-CM | POA: Diagnosis not present

## 2016-10-01 DIAGNOSIS — N401 Enlarged prostate with lower urinary tract symptoms: Secondary | ICD-10-CM | POA: Diagnosis not present

## 2016-10-01 DIAGNOSIS — N200 Calculus of kidney: Secondary | ICD-10-CM | POA: Diagnosis not present

## 2016-10-01 DIAGNOSIS — R972 Elevated prostate specific antigen [PSA]: Secondary | ICD-10-CM | POA: Diagnosis not present

## 2016-10-05 DIAGNOSIS — N281 Cyst of kidney, acquired: Secondary | ICD-10-CM | POA: Diagnosis not present

## 2016-10-05 DIAGNOSIS — N200 Calculus of kidney: Secondary | ICD-10-CM | POA: Diagnosis not present

## 2016-10-19 DIAGNOSIS — I10 Essential (primary) hypertension: Secondary | ICD-10-CM | POA: Diagnosis not present

## 2016-10-19 DIAGNOSIS — M159 Polyosteoarthritis, unspecified: Secondary | ICD-10-CM | POA: Diagnosis not present

## 2016-10-19 DIAGNOSIS — I4891 Unspecified atrial fibrillation: Secondary | ICD-10-CM | POA: Diagnosis not present

## 2016-11-06 DIAGNOSIS — I4891 Unspecified atrial fibrillation: Secondary | ICD-10-CM | POA: Diagnosis not present

## 2016-11-06 DIAGNOSIS — I10 Essential (primary) hypertension: Secondary | ICD-10-CM | POA: Diagnosis not present

## 2016-11-06 DIAGNOSIS — Z6841 Body Mass Index (BMI) 40.0 and over, adult: Secondary | ICD-10-CM | POA: Diagnosis not present

## 2016-11-06 DIAGNOSIS — Z713 Dietary counseling and surveillance: Secondary | ICD-10-CM | POA: Diagnosis not present

## 2016-11-06 DIAGNOSIS — Z299 Encounter for prophylactic measures, unspecified: Secondary | ICD-10-CM | POA: Diagnosis not present

## 2016-11-16 DIAGNOSIS — I10 Essential (primary) hypertension: Secondary | ICD-10-CM | POA: Diagnosis not present

## 2016-11-16 DIAGNOSIS — I4891 Unspecified atrial fibrillation: Secondary | ICD-10-CM | POA: Diagnosis not present

## 2016-11-16 DIAGNOSIS — M159 Polyosteoarthritis, unspecified: Secondary | ICD-10-CM | POA: Diagnosis not present

## 2016-11-24 ENCOUNTER — Ambulatory Visit: Payer: Medicare Other | Admitting: Cardiovascular Disease

## 2016-11-30 DIAGNOSIS — M170 Bilateral primary osteoarthritis of knee: Secondary | ICD-10-CM | POA: Diagnosis not present

## 2016-11-30 DIAGNOSIS — M25562 Pain in left knee: Secondary | ICD-10-CM | POA: Diagnosis not present

## 2016-11-30 DIAGNOSIS — M25561 Pain in right knee: Secondary | ICD-10-CM | POA: Diagnosis not present

## 2016-12-08 DIAGNOSIS — M1712 Unilateral primary osteoarthritis, left knee: Secondary | ICD-10-CM | POA: Diagnosis not present

## 2016-12-08 DIAGNOSIS — M25562 Pain in left knee: Secondary | ICD-10-CM | POA: Diagnosis not present

## 2016-12-09 DIAGNOSIS — M1711 Unilateral primary osteoarthritis, right knee: Secondary | ICD-10-CM | POA: Diagnosis not present

## 2016-12-09 DIAGNOSIS — M25561 Pain in right knee: Secondary | ICD-10-CM | POA: Diagnosis not present

## 2016-12-14 DIAGNOSIS — M1712 Unilateral primary osteoarthritis, left knee: Secondary | ICD-10-CM | POA: Diagnosis not present

## 2016-12-14 DIAGNOSIS — M25562 Pain in left knee: Secondary | ICD-10-CM | POA: Diagnosis not present

## 2016-12-17 ENCOUNTER — Ambulatory Visit (INDEPENDENT_AMBULATORY_CARE_PROVIDER_SITE_OTHER): Payer: Medicare Other | Admitting: Cardiovascular Disease

## 2016-12-17 ENCOUNTER — Encounter: Payer: Self-pay | Admitting: Cardiovascular Disease

## 2016-12-17 VITALS — BP 136/80 | HR 56 | Ht 76.0 in | Wt 381.0 lb

## 2016-12-17 DIAGNOSIS — E782 Mixed hyperlipidemia: Secondary | ICD-10-CM

## 2016-12-17 DIAGNOSIS — R001 Bradycardia, unspecified: Secondary | ICD-10-CM | POA: Diagnosis not present

## 2016-12-17 DIAGNOSIS — I480 Paroxysmal atrial fibrillation: Secondary | ICD-10-CM | POA: Diagnosis not present

## 2016-12-17 DIAGNOSIS — M1711 Unilateral primary osteoarthritis, right knee: Secondary | ICD-10-CM | POA: Diagnosis not present

## 2016-12-17 DIAGNOSIS — G4733 Obstructive sleep apnea (adult) (pediatric): Secondary | ICD-10-CM

## 2016-12-17 DIAGNOSIS — M25561 Pain in right knee: Secondary | ICD-10-CM | POA: Diagnosis not present

## 2016-12-17 NOTE — Progress Notes (Signed)
Cardiology Office Note    Date:  12/18/2016   ID:  Robert Warren, DOB [birth year], MRN [id number]  PCP:  Glenda Chroman, MD  Cardiologist:   Sanda Klein, MD   Chief Complaint  Patient presents with  . Follow-up    3 months    History of Present Illness:  Robert Warren is a 71 y.o. male , Veteran of the [employer], who presented with atrial fibrillation with rapid ventricular response rapidly 9 months ago. Since that time he has been managed with anticoagulants and rate control medications.   He has OSA and uses CPAP. His echo showed evidence of cor pulmonale, but left ventricular systolic function was normal. He did not have any evidence of ischemia on a nuclear stress test. He takes a statin for hyperlipidemia and has a history of hypertension which is well compensated. He is morbidly obese.  At his last appointment she was markedly bradycardic, although asymptomatic. After reducing his dose of rate control medication his heart rate is now typically 55-60.  The patient specifically denies any chest pain at rest exertion, dyspnea at rest or with exertion, orthopnea, paroxysmal nocturnal dyspnea, syncope, focal neurological deficits, intermittent claudication, lower extremity edema, unexplained weight gain, cough, hemoptysis or wheezing. He is very rarely aware of brief palpitations, no more than 2 or 3 times in the last year.  His problems with hematuria and prostatism have improved following his urological procedures.   Past Medical History:  Diagnosis Date  . Atrial fibrillation ([place])   . Hypertension   . Shortness of breath     Past Surgical History:  Procedure Laterality Date  . Anal cyst Removal    . COLONOSCOPY N/A 06/20/2014   Procedure: COLONOSCOPY;  Surgeon: Rogene Houston, MD;  [facility];  Service: Endoscopy;  Laterality: N/A;  1030    Current Medications: Outpatient Medications Prior to Visit  Medication Sig Dispense Refill  . apixaban  (ELIQUIS) 5 MG TABS tablet Take 1 tablet (5 mg total) by mouth 2 (two) times daily. 60 tablet 3  . buPROPion (WELLBUTRIN SR) 200 MG 12 hr tablet Take 200 mg by mouth 2 (two) times daily.     . clonazePAM (KLONOPIN) 0.5 MG tablet Take 0.5 mg by mouth 2 (two) times daily as needed for anxiety.     . ferrous sulfate 325 (65 FE) MG tablet Take 325 mg by mouth 2 (two) times daily.    . finasteride (PROSCAR) 5 MG tablet Take 5 mg by mouth at bedtime.    Marland Kitchen lisinopril (PRINIVIL,ZESTRIL) 40 MG tablet Take 40 mg by mouth daily.    . meclizine (ANTIVERT) 25 MG tablet Take 25 mg by mouth daily as needed (vertigo).     . metoprolol tartrate (LOPRESSOR) 25 MG tablet Take 1/2 tablet 12.5 mg twice a day 180 tablet 3  . QUEtiapine (SEROQUEL) 200 MG tablet Take 1 tablet by mouth at bedtime.    . rosuvastatin (CRESTOR) 40 MG tablet Take 40 mg by mouth daily.    . traZODone (DESYREL) 50 MG tablet Take 50 mg by mouth at bedtime.    . verapamil (CALAN-SR) 240 MG CR tablet Take 1 tablet by mouth at bedtime.    . simvastatin (ZOCOR) 80 MG tablet Take 40 mg by mouth at bedtime.     . tamsulosin (FLOMAX) 0.4 MG CAPS capsule Take 0.4 mg by mouth 2 (two) times daily.      No facility-administered medications prior to visit.  Allergies:   Patient has no known allergies.   Social History   Social History  . Marital status: Married    Spouse name: N/A  . Number of children: N/A  . Years of education: N/A   Social History Main Topics  . Smoking status: Former Smoker    Packs/day: 1.00    Years: 30.00    Types: Cigarettes    Quit date: 08/23/1996  . Smokeless tobacco: Never Used  . Alcohol use No  . Drug use: No  . Sexual activity: Not Asked   Other Topics Concern  . None   Social History Narrative  . None     Family History:  The patient's family history includes Cancer - Lung in his father; Heart attack in his father; Heart disease in his mother.   ROS:   Please see the history of present  illness.    ROS All other systems reviewed and are negative.   PHYSICAL EXAM:   VS:  BP 136/80   Pulse (!) 56   Ht 6\' 4"  (1.93 m)   Wt (!) 172.8 kg (381 lb)   BMI 46.38 kg/m    GEN: Well nourished, well developed, in no acute distress.Morbid obesity  HEENT: normal  Neck: no JVD, carotid bruits, or masses Cardiac: RRR; no murmurs, rubs, or gallops,no edema  Respiratory:  clear to auscultation bilaterally, normal work of breathing GI: soft, nontender, nondistended, + BS MS: no deformity or atrophy  Skin: warm and dry, no rash Neuro:  Alert and Oriented x 3, Strength and sensation are intact Psych: euthymic mood, full affect  Wt Readings from Last 3 Encounters:  12/17/16 (!) 172.8 kg (381 lb)  09/15/16 (!) 174.2 kg (384 lb)  08/27/16 (!) 174.2 kg (384 lb)      Studies/Labs Reviewed:   EKG:  EKG is not ordered today.  The ekg ordered today demonstrates Junctional bradycardia at 48 bpm, QTC 430 ms.  Recent Labs: 02/24/2016: B Natriuretic Peptide 173.7 02/25/2016: TSH 1.968 02/26/2016: ALT 26; BUN 17; Creatinine, Ser 1.35; Hemoglobin 11.5; Magnesium 1.9; Platelets 185; Potassium 3.8; Sodium 140     ASSESSMENT:    No diagnosis found.   PLAN:  In order of problems listed above:  1. Afib: I suspect that he continues to have brief episodes of paroxysmal atrial fibrillation explaining his occasional palpitations CHADSVasc 2 (age, HTN), on anticoagulation. 2. Bradycardia: Secondary to medications and asymptomatic. No indication for pacemaker therapy at this time 3. OSA: Congratulated him for his compliance with CPAP. 4. Morbid obesity: Strongly encouraged more efforts to lose weight. 5. HLP: On statin. .     Medication Adjustments/Labs and Tests Ordered: Current medicines are reviewed at length with the patient today.  Concerns regarding medicines are outlined above.  Medication changes, Labs and Tests ordered today are listed in the Patient Instructions below. Patient  Instructions  Dr Sallyanne Kuster recommends that you schedule a follow-up appointment in 12 months. You will receive a reminder letter in the mail two months in advance. If you don't receive a letter, please call our office to schedule the follow-up appointment.  If you need a refill on your cardiac medications before your next appointment, please call your pharmacy.    Signed, Sanda Klein, MD  12/18/2016 1:58 PM    [facility] [address], [place], [place]  [address] Phone: [phone]; Fax: [phone]

## 2016-12-17 NOTE — Patient Instructions (Signed)
Dr Croitoru recommends that you schedule a follow-up appointment in 12 months. You will receive a reminder letter in the mail two months in advance. If you don't receive a letter, please call our office to schedule the follow-up appointment.  If you need a refill on your cardiac medications before your next appointment, please call your pharmacy. 

## 2016-12-22 DIAGNOSIS — M1712 Unilateral primary osteoarthritis, left knee: Secondary | ICD-10-CM | POA: Diagnosis not present

## 2016-12-22 DIAGNOSIS — M25562 Pain in left knee: Secondary | ICD-10-CM | POA: Diagnosis not present

## 2016-12-24 DIAGNOSIS — M1711 Unilateral primary osteoarthritis, right knee: Secondary | ICD-10-CM | POA: Diagnosis not present

## 2016-12-24 DIAGNOSIS — M25561 Pain in right knee: Secondary | ICD-10-CM | POA: Diagnosis not present

## 2016-12-25 NOTE — Telephone Encounter (Signed)
Discussed at recent office visit. See MCr's last office note for details.

## 2016-12-28 DIAGNOSIS — M159 Polyosteoarthritis, unspecified: Secondary | ICD-10-CM | POA: Diagnosis not present

## 2016-12-28 DIAGNOSIS — I10 Essential (primary) hypertension: Secondary | ICD-10-CM | POA: Diagnosis not present

## 2016-12-28 DIAGNOSIS — I4891 Unspecified atrial fibrillation: Secondary | ICD-10-CM | POA: Diagnosis not present

## 2016-12-31 DIAGNOSIS — M170 Bilateral primary osteoarthritis of knee: Secondary | ICD-10-CM | POA: Diagnosis not present

## 2016-12-31 DIAGNOSIS — M25561 Pain in right knee: Secondary | ICD-10-CM | POA: Diagnosis not present

## 2016-12-31 DIAGNOSIS — M25562 Pain in left knee: Secondary | ICD-10-CM | POA: Diagnosis not present

## 2017-01-04 DIAGNOSIS — I878 Other specified disorders of veins: Secondary | ICD-10-CM | POA: Diagnosis not present

## 2017-01-04 DIAGNOSIS — R9721 Rising PSA following treatment for malignant neoplasm of prostate: Secondary | ICD-10-CM | POA: Diagnosis not present

## 2017-01-04 DIAGNOSIS — Z87438 Personal history of other diseases of male genital organs: Secondary | ICD-10-CM | POA: Diagnosis not present

## 2017-01-04 DIAGNOSIS — N400 Enlarged prostate without lower urinary tract symptoms: Secondary | ICD-10-CM | POA: Diagnosis not present

## 2017-01-04 DIAGNOSIS — E669 Obesity, unspecified: Secondary | ICD-10-CM | POA: Diagnosis not present

## 2017-01-04 DIAGNOSIS — M2578 Osteophyte, vertebrae: Secondary | ICD-10-CM | POA: Diagnosis not present

## 2017-01-04 DIAGNOSIS — I4891 Unspecified atrial fibrillation: Secondary | ICD-10-CM | POA: Diagnosis not present

## 2017-01-04 DIAGNOSIS — Z6841 Body Mass Index (BMI) 40.0 and over, adult: Secondary | ICD-10-CM | POA: Diagnosis not present

## 2017-01-04 DIAGNOSIS — N200 Calculus of kidney: Secondary | ICD-10-CM | POA: Diagnosis not present

## 2017-01-04 DIAGNOSIS — Z87891 Personal history of nicotine dependence: Secondary | ICD-10-CM | POA: Diagnosis not present

## 2017-02-04 DIAGNOSIS — H40053 Ocular hypertension, bilateral: Secondary | ICD-10-CM | POA: Diagnosis not present

## 2017-03-12 DIAGNOSIS — I10 Essential (primary) hypertension: Secondary | ICD-10-CM | POA: Diagnosis not present

## 2017-03-12 DIAGNOSIS — Z6841 Body Mass Index (BMI) 40.0 and over, adult: Secondary | ICD-10-CM | POA: Diagnosis not present

## 2017-03-12 DIAGNOSIS — G4730 Sleep apnea, unspecified: Secondary | ICD-10-CM | POA: Diagnosis not present

## 2017-03-12 DIAGNOSIS — Z299 Encounter for prophylactic measures, unspecified: Secondary | ICD-10-CM | POA: Diagnosis not present

## 2017-03-12 DIAGNOSIS — I4891 Unspecified atrial fibrillation: Secondary | ICD-10-CM | POA: Diagnosis not present

## 2017-03-12 DIAGNOSIS — Z713 Dietary counseling and surveillance: Secondary | ICD-10-CM | POA: Diagnosis not present

## 2017-03-12 DIAGNOSIS — E7800 Pure hypercholesterolemia, unspecified: Secondary | ICD-10-CM | POA: Diagnosis not present

## 2017-04-02 DIAGNOSIS — Z6841 Body Mass Index (BMI) 40.0 and over, adult: Secondary | ICD-10-CM | POA: Diagnosis not present

## 2017-04-02 DIAGNOSIS — Z299 Encounter for prophylactic measures, unspecified: Secondary | ICD-10-CM | POA: Diagnosis not present

## 2017-04-02 DIAGNOSIS — I10 Essential (primary) hypertension: Secondary | ICD-10-CM | POA: Diagnosis not present

## 2017-04-02 DIAGNOSIS — G4730 Sleep apnea, unspecified: Secondary | ICD-10-CM | POA: Diagnosis not present

## 2017-04-02 DIAGNOSIS — I4891 Unspecified atrial fibrillation: Secondary | ICD-10-CM | POA: Diagnosis not present

## 2017-04-02 DIAGNOSIS — Z713 Dietary counseling and surveillance: Secondary | ICD-10-CM | POA: Diagnosis not present

## 2017-04-02 DIAGNOSIS — Z79899 Other long term (current) drug therapy: Secondary | ICD-10-CM | POA: Diagnosis not present

## 2017-04-02 DIAGNOSIS — E7800 Pure hypercholesterolemia, unspecified: Secondary | ICD-10-CM | POA: Diagnosis not present

## 2017-04-08 DIAGNOSIS — I4891 Unspecified atrial fibrillation: Secondary | ICD-10-CM | POA: Diagnosis not present

## 2017-04-08 DIAGNOSIS — I10 Essential (primary) hypertension: Secondary | ICD-10-CM | POA: Diagnosis not present

## 2017-04-08 DIAGNOSIS — M159 Polyosteoarthritis, unspecified: Secondary | ICD-10-CM | POA: Diagnosis not present

## 2017-04-20 DIAGNOSIS — M170 Bilateral primary osteoarthritis of knee: Secondary | ICD-10-CM | POA: Diagnosis not present

## 2017-04-20 DIAGNOSIS — M25561 Pain in right knee: Secondary | ICD-10-CM | POA: Diagnosis not present

## 2017-04-20 DIAGNOSIS — M25562 Pain in left knee: Secondary | ICD-10-CM | POA: Diagnosis not present

## 2017-04-27 DIAGNOSIS — M25561 Pain in right knee: Secondary | ICD-10-CM | POA: Diagnosis not present

## 2017-04-27 DIAGNOSIS — M1711 Unilateral primary osteoarthritis, right knee: Secondary | ICD-10-CM | POA: Diagnosis not present

## 2017-04-29 DIAGNOSIS — M1712 Unilateral primary osteoarthritis, left knee: Secondary | ICD-10-CM | POA: Diagnosis not present

## 2017-04-29 DIAGNOSIS — L989 Disorder of the skin and subcutaneous tissue, unspecified: Secondary | ICD-10-CM | POA: Diagnosis not present

## 2017-04-29 DIAGNOSIS — M25562 Pain in left knee: Secondary | ICD-10-CM | POA: Diagnosis not present

## 2017-04-29 DIAGNOSIS — M4802 Spinal stenosis, cervical region: Secondary | ICD-10-CM | POA: Diagnosis not present

## 2017-04-29 DIAGNOSIS — D490 Neoplasm of unspecified behavior of digestive system: Secondary | ICD-10-CM | POA: Diagnosis not present

## 2017-04-29 DIAGNOSIS — D4989 Neoplasm of unspecified behavior of other specified sites: Secondary | ICD-10-CM | POA: Diagnosis not present

## 2017-05-03 DIAGNOSIS — I10 Essential (primary) hypertension: Secondary | ICD-10-CM | POA: Diagnosis not present

## 2017-05-03 DIAGNOSIS — M159 Polyosteoarthritis, unspecified: Secondary | ICD-10-CM | POA: Diagnosis not present

## 2017-05-03 DIAGNOSIS — I4891 Unspecified atrial fibrillation: Secondary | ICD-10-CM | POA: Diagnosis not present

## 2017-05-06 DIAGNOSIS — M25561 Pain in right knee: Secondary | ICD-10-CM | POA: Diagnosis not present

## 2017-05-06 DIAGNOSIS — M170 Bilateral primary osteoarthritis of knee: Secondary | ICD-10-CM | POA: Diagnosis not present

## 2017-05-06 DIAGNOSIS — M25562 Pain in left knee: Secondary | ICD-10-CM | POA: Diagnosis not present

## 2017-05-25 DIAGNOSIS — M542 Cervicalgia: Secondary | ICD-10-CM | POA: Diagnosis not present

## 2017-05-25 DIAGNOSIS — K115 Sialolithiasis: Secondary | ICD-10-CM | POA: Diagnosis not present

## 2017-05-25 DIAGNOSIS — I10 Essential (primary) hypertension: Secondary | ICD-10-CM | POA: Diagnosis not present

## 2017-05-25 DIAGNOSIS — Z6841 Body Mass Index (BMI) 40.0 and over, adult: Secondary | ICD-10-CM | POA: Diagnosis not present

## 2017-05-25 DIAGNOSIS — I4891 Unspecified atrial fibrillation: Secondary | ICD-10-CM | POA: Diagnosis not present

## 2017-05-25 DIAGNOSIS — Z23 Encounter for immunization: Secondary | ICD-10-CM | POA: Diagnosis not present

## 2017-05-25 DIAGNOSIS — Z299 Encounter for prophylactic measures, unspecified: Secondary | ICD-10-CM | POA: Diagnosis not present

## 2017-06-07 DIAGNOSIS — K1120 Sialoadenitis, unspecified: Secondary | ICD-10-CM | POA: Diagnosis not present

## 2017-06-08 DIAGNOSIS — Z6841 Body Mass Index (BMI) 40.0 and over, adult: Secondary | ICD-10-CM | POA: Diagnosis not present

## 2017-06-08 DIAGNOSIS — E785 Hyperlipidemia, unspecified: Secondary | ICD-10-CM | POA: Diagnosis present

## 2017-06-08 DIAGNOSIS — Z79899 Other long term (current) drug therapy: Secondary | ICD-10-CM | POA: Diagnosis not present

## 2017-06-08 DIAGNOSIS — R509 Fever, unspecified: Secondary | ICD-10-CM | POA: Diagnosis not present

## 2017-06-08 DIAGNOSIS — R300 Dysuria: Secondary | ICD-10-CM | POA: Diagnosis not present

## 2017-06-08 DIAGNOSIS — Z87891 Personal history of nicotine dependence: Secondary | ICD-10-CM | POA: Diagnosis not present

## 2017-06-08 DIAGNOSIS — N390 Urinary tract infection, site not specified: Secondary | ICD-10-CM | POA: Diagnosis not present

## 2017-06-08 DIAGNOSIS — E873 Alkalosis: Secondary | ICD-10-CM | POA: Diagnosis not present

## 2017-06-08 DIAGNOSIS — E7800 Pure hypercholesterolemia, unspecified: Secondary | ICD-10-CM | POA: Diagnosis present

## 2017-06-08 DIAGNOSIS — R0602 Shortness of breath: Secondary | ICD-10-CM | POA: Diagnosis not present

## 2017-06-08 DIAGNOSIS — N419 Inflammatory disease of prostate, unspecified: Secondary | ICD-10-CM | POA: Diagnosis not present

## 2017-06-08 DIAGNOSIS — A4151 Sepsis due to Escherichia coli [E. coli]: Secondary | ICD-10-CM | POA: Diagnosis not present

## 2017-06-08 DIAGNOSIS — R6521 Severe sepsis with septic shock: Secondary | ICD-10-CM | POA: Diagnosis not present

## 2017-06-08 DIAGNOSIS — Z299 Encounter for prophylactic measures, unspecified: Secondary | ICD-10-CM | POA: Diagnosis not present

## 2017-06-08 DIAGNOSIS — Z7902 Long term (current) use of antithrombotics/antiplatelets: Secondary | ICD-10-CM | POA: Diagnosis not present

## 2017-06-08 DIAGNOSIS — I10 Essential (primary) hypertension: Secondary | ICD-10-CM | POA: Diagnosis present

## 2017-06-08 DIAGNOSIS — I959 Hypotension, unspecified: Secondary | ICD-10-CM | POA: Diagnosis not present

## 2017-06-08 DIAGNOSIS — Z8249 Family history of ischemic heart disease and other diseases of the circulatory system: Secondary | ICD-10-CM | POA: Diagnosis not present

## 2017-06-08 DIAGNOSIS — N400 Enlarged prostate without lower urinary tract symptoms: Secondary | ICD-10-CM | POA: Diagnosis present

## 2017-06-08 DIAGNOSIS — A419 Sepsis, unspecified organism: Secondary | ICD-10-CM | POA: Diagnosis not present

## 2017-06-08 DIAGNOSIS — I4891 Unspecified atrial fibrillation: Secondary | ICD-10-CM | POA: Diagnosis present

## 2017-06-08 DIAGNOSIS — Z801 Family history of malignant neoplasm of trachea, bronchus and lung: Secondary | ICD-10-CM | POA: Diagnosis not present

## 2017-06-18 DIAGNOSIS — G4730 Sleep apnea, unspecified: Secondary | ICD-10-CM | POA: Diagnosis not present

## 2017-06-18 DIAGNOSIS — E7800 Pure hypercholesterolemia, unspecified: Secondary | ICD-10-CM | POA: Diagnosis not present

## 2017-06-18 DIAGNOSIS — Z299 Encounter for prophylactic measures, unspecified: Secondary | ICD-10-CM | POA: Diagnosis not present

## 2017-06-18 DIAGNOSIS — I4891 Unspecified atrial fibrillation: Secondary | ICD-10-CM | POA: Diagnosis not present

## 2017-06-18 DIAGNOSIS — A419 Sepsis, unspecified organism: Secondary | ICD-10-CM | POA: Diagnosis not present

## 2017-06-18 DIAGNOSIS — Z6841 Body Mass Index (BMI) 40.0 and over, adult: Secondary | ICD-10-CM | POA: Diagnosis not present

## 2017-06-21 ENCOUNTER — Telehealth: Payer: Self-pay | Admitting: Cardiovascular Disease

## 2017-06-21 NOTE — Telephone Encounter (Signed)
New message    Pt c/o Shortness Of Breath: STAT if SOB developed within the last 24 hours or pt is noticeably SOB on the phone  1. Are you currently SOB (can you hear that pt is SOB on the phone)? No   2. How long have you been experiencing SOB? Awhile now   3. Are you SOB when sitting or when up moving around? Moving around   4. Are you currently experiencing any other symptoms? dizziness   STAT if patient feels like he/she is going to faint   1) Are you dizzy now? No - lying down   2) Do you feel faint or have you passed out? No   3) Do you have any other symptoms? Sob   4) Have you checked your HR and BP (record if available)? 114/65

## 2017-06-21 NOTE — Telephone Encounter (Signed)
Returned call to patient he stated he has been dizzy,sob since last office visit in April.Stated he gets sob with exertion.Appointment scheduled with Bernerd Pho PA 06/24/17 at 11:00 am.

## 2017-06-23 NOTE — Progress Notes (Signed)
Cardiology Office Note    Date:  06/24/2017   ID:  Robert Warren, DOB [birth year], MRN [id number]  PCP:  Glenda Chroman, MD  Cardiologist: Dr. Sallyanne Kuster   Chief Complaint  Patient presents with  . Follow-up    Dizziness    History of Present Illness:    Robert Warren is a 70 y.o. male with past medical history of PAF (on Eliquis), OSA (on CPAP), HLD, HTN, and morbid obesity who presents to the office today for evaluation of dizziness.   He was last evaluated by Dr. Sallyanne Kuster in 11/2016 and reported doing well from a cardiac perspective at that time. Noted occasional palpitations only occurring a few times every 3-6 months. He called the office on 06/21/2017 reporting episodes of dyspnea and dizziness for the past several months and was added-on for an acute visit.   In talking with the patient today, he reports having episodes of dizziness occurring sporadically over the past several months. He denies any associated palpitations, chest discomfort, dyspnea, or presyncope when this occurs. Reports he typically sits down and the symptoms resolve within 10-20 minutes. He does have a blood pressure cuff at home and reports systolic readings are usually in the 80's to 110's.   He has continued on all of his medications and reports being started on Chlorthalidone 25 mg daily 3-4 months ago by his PCP for fluid retention. Has not noticed any worsening of his symptoms since being started on the medication. Denies any recent chest pain or dyspnea on exertion. No orthopnea, PND, or lower extremity edema.   He was hospitalized at [facility] in 05/2017 for an "infection" which sounds most consistent with sepsis based off of the treatment he describes. Says all of his BP and HR medications were held at that time secondary to hypotension but were resumed at the time of hospital discharge.    Past Medical History:  Diagnosis Date  . Atrial fibrillation ([place])   . Hypertension   . Shortness of  breath     Past Surgical History:  Procedure Laterality Date  . Anal cyst Removal    . COLONOSCOPY N/A 06/20/2014   Procedure: COLONOSCOPY;  Surgeon: Rogene Houston, MD;  Location: [facility];  Service: Endoscopy;  Laterality: N/A;  1030    Current Medications: Outpatient Medications Prior to Visit  Medication Sig Dispense Refill  . apixaban (ELIQUIS) 5 MG TABS tablet Take 1 tablet (5 mg total) by mouth 2 (two) times daily. 60 tablet 3  . ARIPiprazole (ABILIFY PO) Take 1 tablet by mouth as needed.    Marland Kitchen buPROPion (WELLBUTRIN SR) 200 MG 12 hr tablet Take 200 mg by mouth 2 (two) times daily.     . clonazePAM (KLONOPIN) 0.5 MG tablet Take 0.5 mg by mouth 2 (two) times daily as needed for anxiety.     . ferrous sulfate 325 (65 FE) MG tablet Take 325 mg by mouth 2 (two) times daily.    . metoprolol tartrate (LOPRESSOR) 25 MG tablet Take 1/2 tablet 12.5 mg twice a day 180 tablet 3  . QUEtiapine (SEROQUEL) 200 MG tablet Take 1 tablet by mouth at bedtime.    . rosuvastatin (CRESTOR) 40 MG tablet Take 40 mg by mouth daily.    . traZODone (DESYREL) 50 MG tablet Take 50 mg by mouth at bedtime.    . verapamil (CALAN-SR) 240 MG CR tablet Take 1 tablet by mouth at bedtime.    Marland Kitchen lisinopril (PRINIVIL,ZESTRIL) 40 MG tablet Take  40 mg by mouth daily.    . finasteride (PROSCAR) 5 MG tablet Take 5 mg by mouth at bedtime.    . meclizine (ANTIVERT) 25 MG tablet Take 25 mg by mouth daily as needed (vertigo).      No facility-administered medications prior to visit.      Allergies:   Patient has no known allergies.   Social History   Social History  . Marital status: Married    Spouse name: N/A  . Number of children: N/A  . Years of education: N/A   Social History Main Topics  . Smoking status: Former Smoker    Packs/day: 1.00    Years: 30.00    Types: Cigarettes    Quit date: 08/23/1996  . Smokeless tobacco: Never Used  . Alcohol use No  . Drug use: No  . Sexual activity: Not Asked    Other Topics Concern  . None   Social History Narrative  . None     Family History:  The patient's family history includes Cancer - Lung in his father; Heart attack in his father; Heart disease in his mother.   Review of Systems:   Please see the history of present illness.     General:  No chills, fever, night sweats or weight changes.  Cardiovascular:  No chest pain, dyspnea on exertion, edema, orthopnea, palpitations, paroxysmal nocturnal dyspnea. Positive for dizziness.  Dermatological: No rash, lesions/masses Respiratory: No cough, dyspnea Urologic: No hematuria, dysuria Abdominal:   No nausea, vomiting, diarrhea, bright red blood per rectum, melena, or hematemesis Neurologic:  No visual changes, wkns, changes in mental status.  All other systems reviewed and are otherwise negative except as noted above.   Physical Exam:    VS:  BP (!) 86/58 (BP Location: Left Arm, Patient Position: Sitting, Cuff Size: Large)   Pulse (!) 54   Ht 6\' 4"  (1.93 m)   Wt (!) 378 lb (171.5 kg)   BMI 46.01 kg/m    General: Well developed, obese African American male appearing in no acute distress. Head: Normocephalic, atraumatic, sclera non-icteric, no xanthomas, nares are without discharge.  Neck: No carotid bruits. JVD difficult to assess secondary to body habitus.  Lungs: Respirations regular and unlabored, without wheezes or rales.  Heart: Regular rate and rhythm. No S3 or S4.  No murmur, no rubs, or gallops appreciated. Abdomen: Soft, non-tender, non-distended with normoactive bowel sounds. No hepatomegaly. No rebound/guarding. No obvious abdominal masses. Msk:  Strength and tone appear normal for age. No joint deformities or effusions. Extremities: No clubbing or cyanosis. No lower extremity edema.  Distal pedal pulses are 2+ bilaterally. Neuro: Alert and oriented X 3. Moves all extremities spontaneously. No focal deficits noted. Psych:  Responds to questions appropriately with a normal  affect. Skin: No rashes or lesions noted  Wt Readings from Last 3 Encounters:  06/24/17 (!) 378 lb (171.5 kg)  12/17/16 (!) 381 lb (172.8 kg)  09/15/16 (!) 384 lb (174.2 kg)     Studies/Labs Reviewed:   EKG:  EKG is not ordered today.   Recent Labs: No results found for requested labs within last 8760 hours.   Lipid Panel No results found for: CHOL, TRIG, HDL, CHOLHDL, VLDL, LDLCALC, LDLDIRECT  Additional studies/ records that were reviewed today include:   Echocardiogram: 02/2016 Study Conclusions  - Left ventricle: The cavity size was normal. There was moderate   concentric hypertrophy. Systolic function was normal. The   estimated ejection fraction was in the range of  55% to 60%. Wall   motion was normal; there were no regional wall motion   abnormalities. The transmitral flow pattern was normal. Left   ventricular diastolic function parameters were normal. - Aortic valve: Trileaflet; mildly thickened, mildly calcified   leaflets. - Right ventricle: The cavity size was moderately dilated. Wall   thickness was normal. Systolic function was moderately reduced.  NST: 03/2016  Nuclear stress EF: 46%.  There was no ST segment deviation noted during stress.  The study is normal.  This is a low risk study.  The left ventricular ejection fraction is mildly decreased (45-54%).  Assessment:    1. Dizziness   2. Paroxysmal atrial fibrillation (HCC)   3. Current use of long term anticoagulation   4. Essential hypertension   5. Mixed hyperlipidemia   6. OSA (obstructive sleep apnea)      Plan:   In order of problems listed above:  1. Dizziness - the patient reports having episodes of dizziness occurring sporadically over the past several months and denies any associated palpitations, chest discomfort, dyspnea, or presyncope when this occurs. Not associated with positional changes. He checks his BP when this occurs and says systolic readings are soft in the 80's  to 110's. BP at 86/58 during today's visit. Symptoms likely secondary to hypotension.  - will reduce Lisinopril from 40mg  daily to 20mg  daily. He will check his BP and HR daily over the next week and call back with results. If HR remains well-controlled but BP still soft, would reduce Chlorthalidone dosing.   2. Paroxysmal Atrial Fibrillation/ Long-term Anticoagulation - he denies any recent palpitations or dyspnea on exertion. He is maintaining NSR by physical examination. Continue Lopressor 12.5mg  BID and Verapamil 240mg  daily.  - he denies any evidence of active bleeding. Continue Eliquis 5mg  BID for anticoagulation.   3. HTN - BP is soft today at 86/58. - He is currently on Lisinopril 40 mg daily, Lopressor 12.5 mg BID, Verapamil 240 mg daily, and Chlorthalidone 25 mg daily. Will reduce Lisinopril to 20mg  daily in the setting of hypotension and dizziness as above. Will keep AV nodal blocking agents at current dosing for now as he has experienced episodes of atrial fibrillation with RVR in the past.   4. HLD - followed by PCP. He remains on Crestor 40mg  daily.   5. OSA - continued compliance with CPAP encouraged.    Medication Adjustments/Labs and Tests Ordered: Current medicines are reviewed at length with the patient today.  Concerns regarding medicines are outlined above.  Medication changes, Labs and Tests ordered today are listed in the Patient Instructions below. Patient Instructions  Medication Instructions: DECREASE the Lisinopril to 20 mg tablet daily.   If you need a refill on your cardiac medications before your next appointment, please call your pharmacy.    Follow-Up: Your physician wants you to keep your follow-up in March with Dr. Sallyanne Kuster.  Special Instructions: Please call next week with your blood pressure readings. You may call [phone] and ask for triage.   Thank you for choosing Heartcare at [facility]!!      Signed, Erma Heritage, PA-C    06/24/2017 2:20 PM    [address], [place], [place]  [address] Phone: [phone]; Fax: [phone]  [address] [place], [place] [address] Phone: [phone]

## 2017-06-24 ENCOUNTER — Encounter: Payer: Self-pay | Admitting: Student

## 2017-06-24 ENCOUNTER — Ambulatory Visit (INDEPENDENT_AMBULATORY_CARE_PROVIDER_SITE_OTHER): Payer: Medicare Other | Admitting: Student

## 2017-06-24 VITALS — BP 86/58 | HR 54 | Ht 76.0 in | Wt 378.0 lb

## 2017-06-24 DIAGNOSIS — R42 Dizziness and giddiness: Secondary | ICD-10-CM

## 2017-06-24 DIAGNOSIS — I480 Paroxysmal atrial fibrillation: Secondary | ICD-10-CM | POA: Diagnosis not present

## 2017-06-24 DIAGNOSIS — Z7901 Long term (current) use of anticoagulants: Secondary | ICD-10-CM

## 2017-06-24 DIAGNOSIS — I10 Essential (primary) hypertension: Secondary | ICD-10-CM

## 2017-06-24 DIAGNOSIS — G4733 Obstructive sleep apnea (adult) (pediatric): Secondary | ICD-10-CM | POA: Diagnosis not present

## 2017-06-24 DIAGNOSIS — E782 Mixed hyperlipidemia: Secondary | ICD-10-CM

## 2017-06-24 MED ORDER — LISINOPRIL 20 MG PO TABS: 20.0000 mg | ORAL_TABLET | Freq: Every day | ORAL | Status: DC

## 2017-06-24 NOTE — Patient Instructions (Signed)
Medication Instructions: DECREASE the Lisinopril to 20 mg tablet daily.   If you need a refill on your cardiac medications before your next appointment, please call your pharmacy.    Follow-Up: Your physician wants you to keep your follow-up in March with Dr. Sallyanne Kuster.  Special Instructions: Please call next week with your blood pressure readings. You may call [phone] and ask for triage.   Thank you for choosing Heartcare at [facility]!!

## 2017-07-07 ENCOUNTER — Telehealth: Payer: Self-pay | Admitting: Student

## 2017-07-07 NOTE — Telephone Encounter (Signed)
Please call,he needs to give you an update on his blood pressure and his readings.

## 2017-07-07 NOTE — Telephone Encounter (Signed)
Returned call to patient.He was going to report B/P readings to [place] PA.B/P in mornings-109/65,122/84,108/60,106/70.PM readings-98/63,96/54,102/65,84/59.PPIRJ-18,84,16,60,63,01,60,10.Stated he has been feeling good,no complaints.Advised I will send message to [place] PA.

## 2017-07-08 NOTE — Telephone Encounter (Signed)
Please thank the patient for calling in to report his values. I am glad he is feeling better. If his dizziness has improved, would continue current medication regimen. If still having significant dizziness, reduce Chlorthalidone from 25mg  daily to 12.5mg  daily as his BP remains soft but overall significantly improved when compared to his last office visit. Thank you.   Signed, Erma Heritage, PA-C 07/08/2017, 11:21 AM

## 2017-07-09 NOTE — Telephone Encounter (Signed)
Spoke to patient Robert Warren's recommendations given.Stated his dizziness is better.He will decrease Chlorthalidone if dizziness gets worse.Advised to keep appointment as planned and call sooner if needed.

## 2017-07-20 DIAGNOSIS — Z1211 Encounter for screening for malignant neoplasm of colon: Secondary | ICD-10-CM | POA: Diagnosis not present

## 2017-07-20 DIAGNOSIS — Z299 Encounter for prophylactic measures, unspecified: Secondary | ICD-10-CM | POA: Diagnosis not present

## 2017-07-20 DIAGNOSIS — I10 Essential (primary) hypertension: Secondary | ICD-10-CM | POA: Diagnosis not present

## 2017-07-20 DIAGNOSIS — Z79899 Other long term (current) drug therapy: Secondary | ICD-10-CM | POA: Diagnosis not present

## 2017-07-20 DIAGNOSIS — Z0000 Encounter for general adult medical examination without abnormal findings: Secondary | ICD-10-CM | POA: Diagnosis not present

## 2017-07-20 DIAGNOSIS — Z87891 Personal history of nicotine dependence: Secondary | ICD-10-CM | POA: Diagnosis not present

## 2017-07-20 DIAGNOSIS — Z6841 Body Mass Index (BMI) 40.0 and over, adult: Secondary | ICD-10-CM | POA: Diagnosis not present

## 2017-07-20 DIAGNOSIS — E7800 Pure hypercholesterolemia, unspecified: Secondary | ICD-10-CM | POA: Diagnosis not present

## 2017-07-20 DIAGNOSIS — R5383 Other fatigue: Secondary | ICD-10-CM | POA: Diagnosis not present

## 2017-07-20 DIAGNOSIS — Z7189 Other specified counseling: Secondary | ICD-10-CM | POA: Diagnosis not present

## 2017-07-20 DIAGNOSIS — Z1331 Encounter for screening for depression: Secondary | ICD-10-CM | POA: Diagnosis not present

## 2017-07-20 DIAGNOSIS — Z1339 Encounter for screening examination for other mental health and behavioral disorders: Secondary | ICD-10-CM | POA: Diagnosis not present

## 2017-07-20 DIAGNOSIS — Z125 Encounter for screening for malignant neoplasm of prostate: Secondary | ICD-10-CM | POA: Diagnosis not present

## 2017-08-02 IMAGING — CT CT RENAL STONE PROTOCOL
2 of 5 series · 16 of 46 positions shown, 18 images · non-contrast
Comparison: None.

CLINICAL DATA: Left flank pain and hematuria

EXAM:
CT ABDOMEN AND PELVIS WITHOUT CONTRAST
TECHNIQUE: Multidetector CT imaging of the abdomen and pelvis was performed
following the standard protocol without IV contrast.

[Series 6: thins · axial · 0.98mm/px · z∈[-370,+84]mm · 13 of 107 slices shown, 15 images]
[im 8/107  soft-tissue]
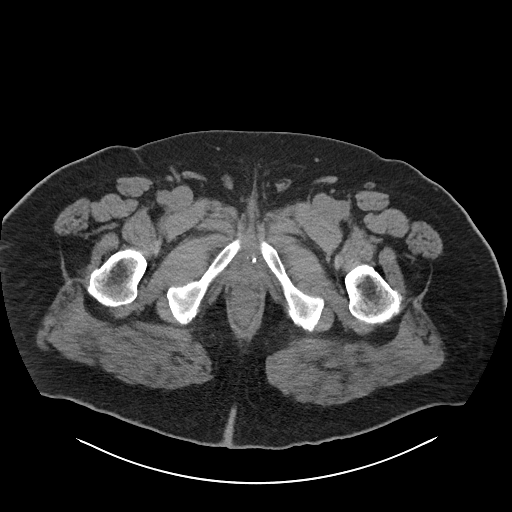
[im 8/107  bone]
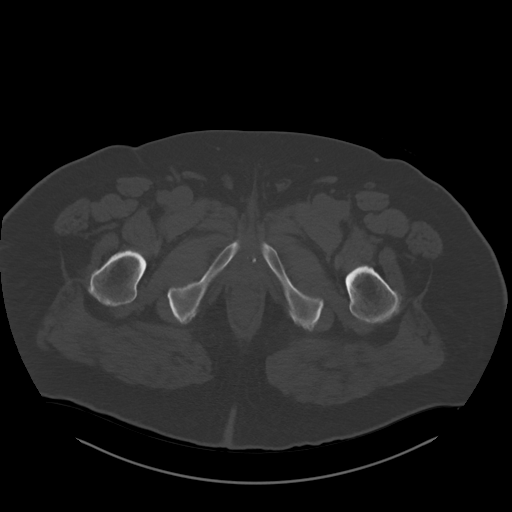
[im 16/107  soft-tissue]
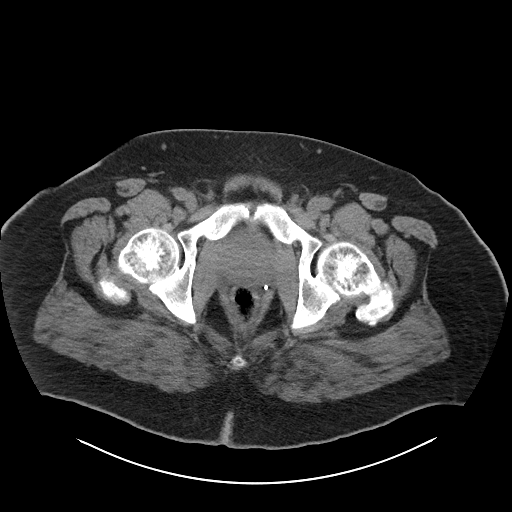
[im 23/107  soft-tissue]
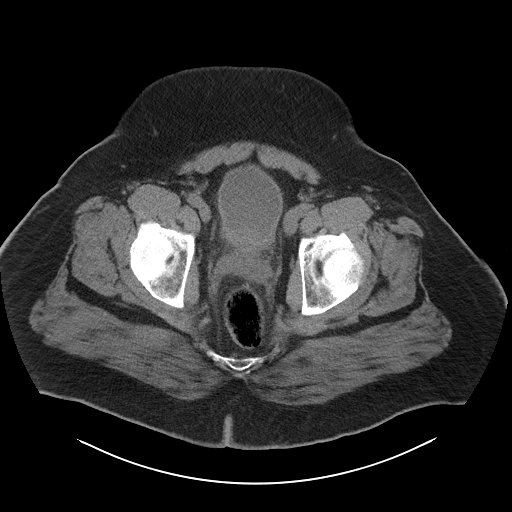
[im 31/107  soft-tissue]
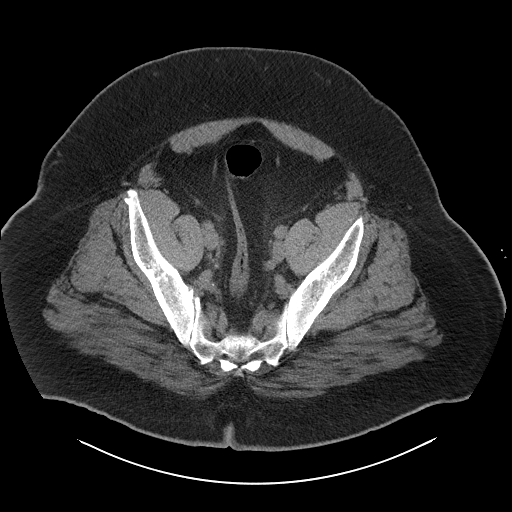
[im 38/107  soft-tissue]
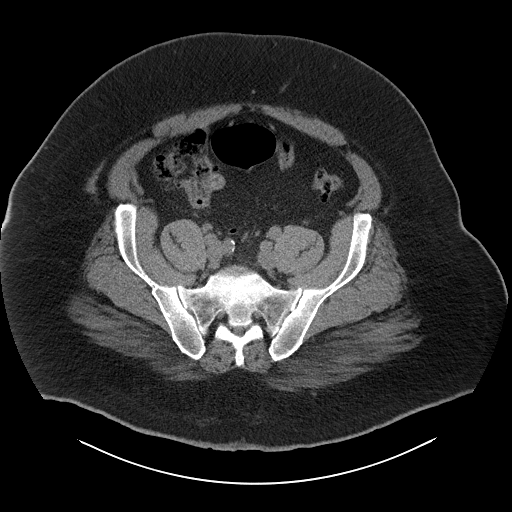
[im 46/107  soft-tissue]
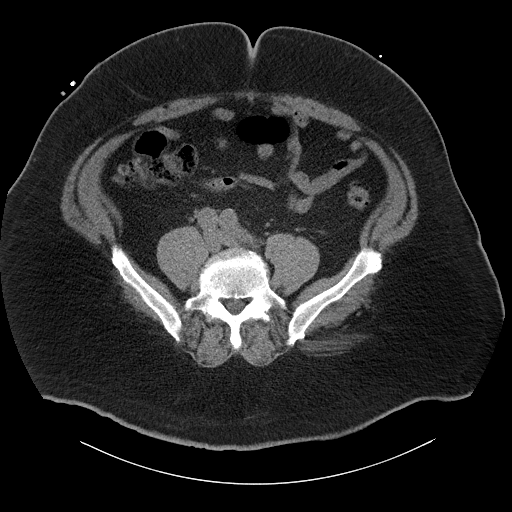
[im 54/107  soft-tissue]
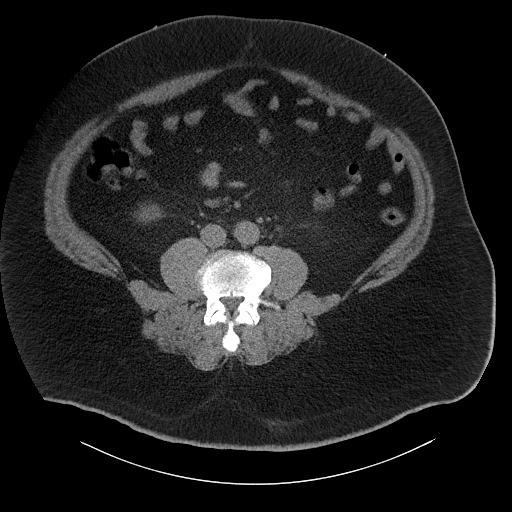
[im 61/107  soft-tissue]
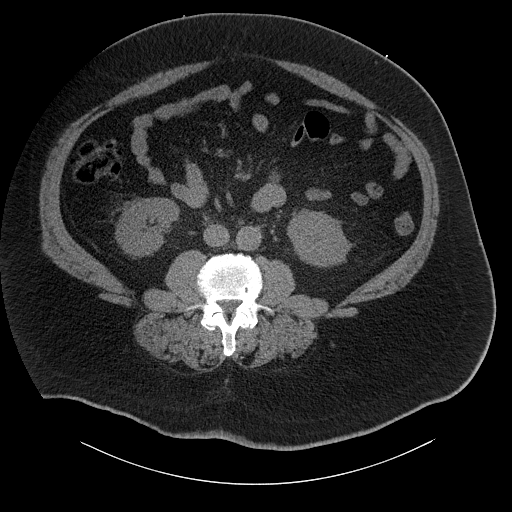
[im 69/107  soft-tissue]
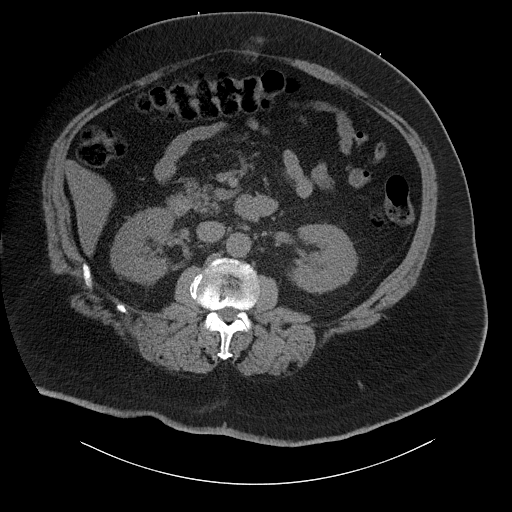
[im 69/107  bone]
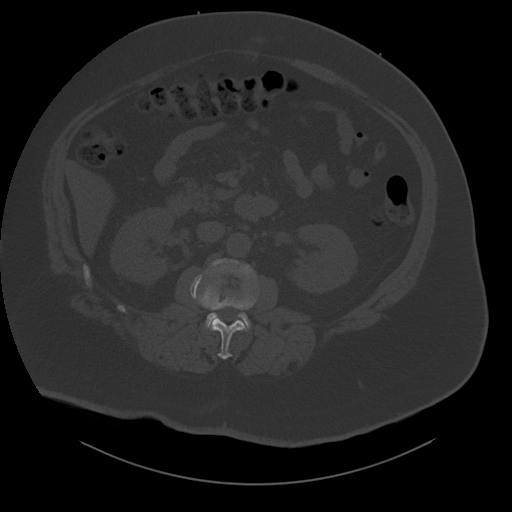
[im 76/107  soft-tissue]
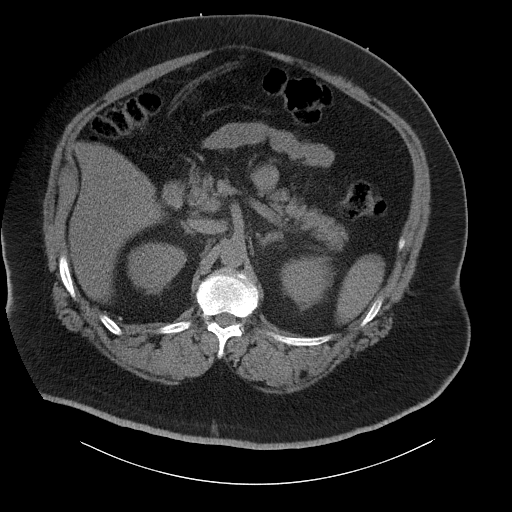
[im 84/107  soft-tissue]
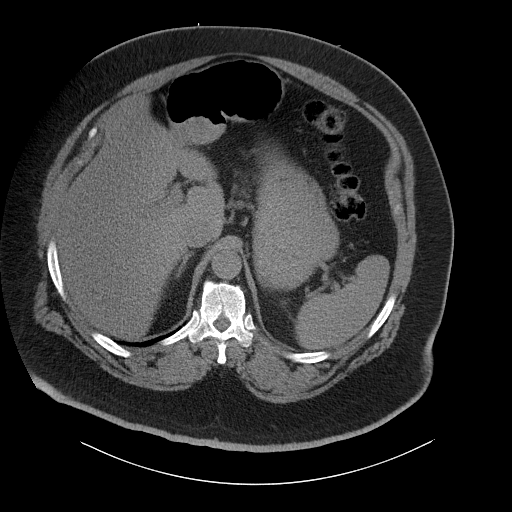
[im 91/107  soft-tissue]
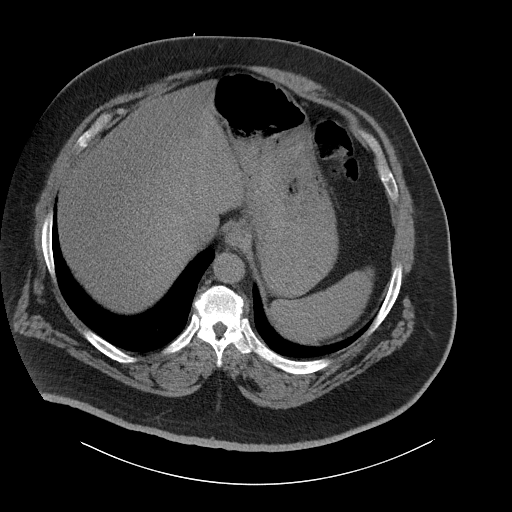
[im 99/107  soft-tissue]
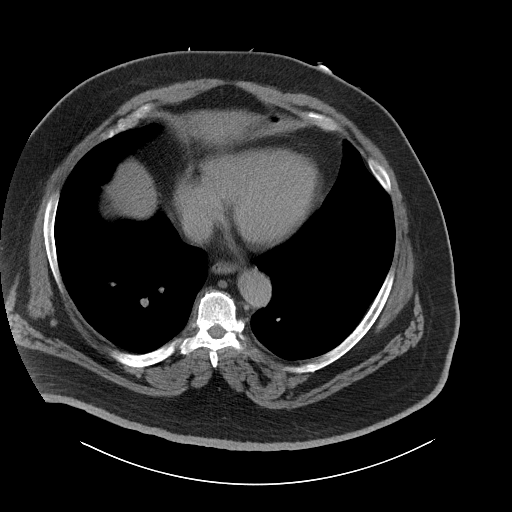

[Series 14: a/p · coronal · 1.02mm/px · 3 of 155 slices shown]
[im 52/155  soft-tissue]
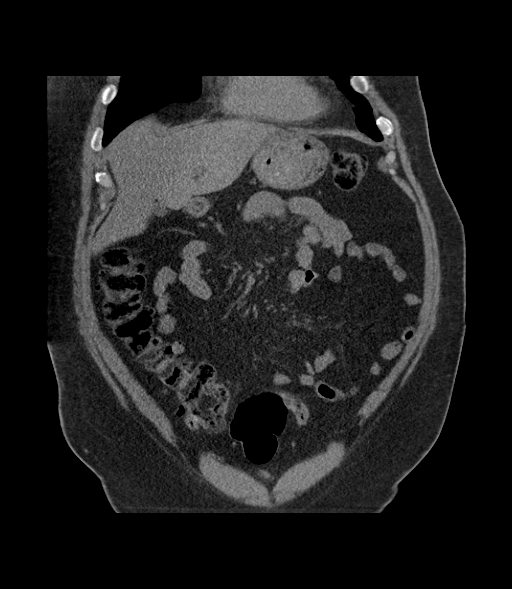
[im 69/155  soft-tissue]
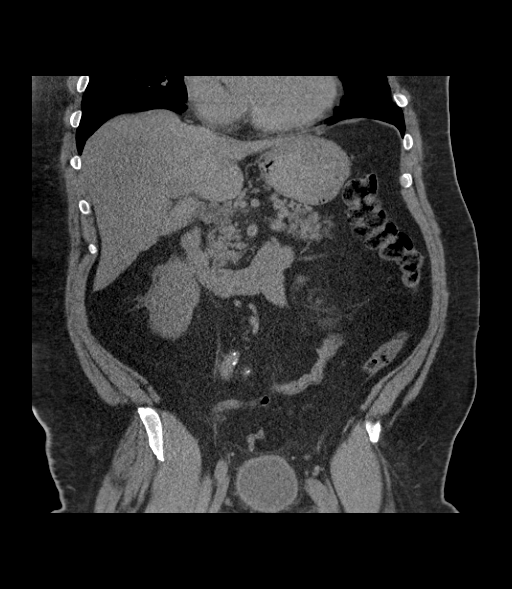
[im 86/155  soft-tissue]
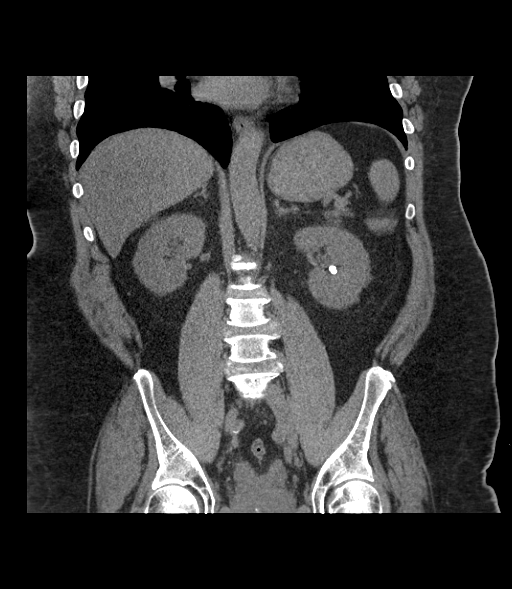

[16 of 46 positions shown; findings below may reference images not displayed]

FINDINGS: Lower chest: There is no pleural fluid identified. The lung bases
are clear.

Hepatobiliary: Hepatic steatosis noted. No focal the liver
abnormality. The gallbladder is normal. No biliary dilatation.

Pancreas: No mass or inflammatory process identified on this
un-enhanced exam.

Spleen: Within normal limits in size.

Adrenals/Urinary Tract: Normal adrenal glands. Normal appearance of
the right kidney. Stone within the mid left kidney measures 8 mm. No
hydronephrosis or hydroureter. The urinary bladder appears normal.

Stomach/Bowel: The stomach is normal. No pathologic dilatation of
the large or small bowel loops. Normal appearance of the proximal
colon. Numerous distal colonic diverticula noted without acute
inflammation.

Vascular/Lymphatic: Normal appearance of the abdominal aorta. No
enlarged retroperitoneal or mesenteric adenopathy. No enlarged
pelvic or inguinal lymph nodes.

Reproductive: There is prostate gland enlargement.

Other: There is no ascites or focal fluid collections within the
abdomen or pelvis. Supraumbilical ventral abdominal wall hernia
contains fat only, image 55 of series 6.

Musculoskeletal: No suspicious bone lesions identified. There is
degenerative disc disease noted within the lumbar spine.
IMPRESSION: 1. No hydronephrosis, hydroureter or ureteral lithiasis noted.
2. Mid left renal calculus measures 8 mm.
3. Prostate gland enlargement.

## 2017-08-05 DIAGNOSIS — M170 Bilateral primary osteoarthritis of knee: Secondary | ICD-10-CM | POA: Diagnosis not present

## 2017-08-05 DIAGNOSIS — M21162 Varus deformity, not elsewhere classified, left knee: Secondary | ICD-10-CM | POA: Diagnosis not present

## 2017-08-05 DIAGNOSIS — M21161 Varus deformity, not elsewhere classified, right knee: Secondary | ICD-10-CM | POA: Diagnosis not present

## 2017-08-11 DIAGNOSIS — M159 Polyosteoarthritis, unspecified: Secondary | ICD-10-CM | POA: Diagnosis not present

## 2017-08-11 DIAGNOSIS — I4891 Unspecified atrial fibrillation: Secondary | ICD-10-CM | POA: Diagnosis not present

## 2017-08-11 DIAGNOSIS — I10 Essential (primary) hypertension: Secondary | ICD-10-CM | POA: Diagnosis not present

## 2017-09-08 DIAGNOSIS — K760 Fatty (change of) liver, not elsewhere classified: Secondary | ICD-10-CM | POA: Diagnosis not present

## 2017-09-08 DIAGNOSIS — N400 Enlarged prostate without lower urinary tract symptoms: Secondary | ICD-10-CM | POA: Diagnosis not present

## 2017-09-08 DIAGNOSIS — N281 Cyst of kidney, acquired: Secondary | ICD-10-CM | POA: Diagnosis not present

## 2017-09-08 DIAGNOSIS — N200 Calculus of kidney: Secondary | ICD-10-CM | POA: Diagnosis not present

## 2017-09-16 DIAGNOSIS — M159 Polyosteoarthritis, unspecified: Secondary | ICD-10-CM | POA: Diagnosis not present

## 2017-09-16 DIAGNOSIS — I4891 Unspecified atrial fibrillation: Secondary | ICD-10-CM | POA: Diagnosis not present

## 2017-09-16 DIAGNOSIS — I10 Essential (primary) hypertension: Secondary | ICD-10-CM | POA: Diagnosis not present

## 2017-10-20 DIAGNOSIS — Z6841 Body Mass Index (BMI) 40.0 and over, adult: Secondary | ICD-10-CM | POA: Diagnosis not present

## 2017-10-20 DIAGNOSIS — E7800 Pure hypercholesterolemia, unspecified: Secondary | ICD-10-CM | POA: Diagnosis not present

## 2017-10-20 DIAGNOSIS — I10 Essential (primary) hypertension: Secondary | ICD-10-CM | POA: Diagnosis not present

## 2017-10-20 DIAGNOSIS — Z299 Encounter for prophylactic measures, unspecified: Secondary | ICD-10-CM | POA: Diagnosis not present

## 2017-10-20 DIAGNOSIS — G4730 Sleep apnea, unspecified: Secondary | ICD-10-CM | POA: Diagnosis not present

## 2017-10-20 DIAGNOSIS — I4891 Unspecified atrial fibrillation: Secondary | ICD-10-CM | POA: Diagnosis not present

## 2017-12-02 DIAGNOSIS — M159 Polyosteoarthritis, unspecified: Secondary | ICD-10-CM | POA: Diagnosis not present

## 2017-12-02 DIAGNOSIS — I10 Essential (primary) hypertension: Secondary | ICD-10-CM | POA: Diagnosis not present

## 2017-12-02 DIAGNOSIS — I4891 Unspecified atrial fibrillation: Secondary | ICD-10-CM | POA: Diagnosis not present

## 2017-12-28 DIAGNOSIS — I4891 Unspecified atrial fibrillation: Secondary | ICD-10-CM | POA: Diagnosis not present

## 2017-12-28 DIAGNOSIS — I10 Essential (primary) hypertension: Secondary | ICD-10-CM | POA: Diagnosis not present

## 2017-12-28 DIAGNOSIS — M159 Polyosteoarthritis, unspecified: Secondary | ICD-10-CM | POA: Diagnosis not present

## 2018-01-19 DIAGNOSIS — I10 Essential (primary) hypertension: Secondary | ICD-10-CM | POA: Diagnosis not present

## 2018-01-19 DIAGNOSIS — Z299 Encounter for prophylactic measures, unspecified: Secondary | ICD-10-CM | POA: Diagnosis not present

## 2018-01-19 DIAGNOSIS — Z6841 Body Mass Index (BMI) 40.0 and over, adult: Secondary | ICD-10-CM | POA: Diagnosis not present

## 2018-01-19 DIAGNOSIS — Z87891 Personal history of nicotine dependence: Secondary | ICD-10-CM | POA: Diagnosis not present

## 2018-01-19 DIAGNOSIS — I4891 Unspecified atrial fibrillation: Secondary | ICD-10-CM | POA: Diagnosis not present

## 2018-01-19 DIAGNOSIS — R42 Dizziness and giddiness: Secondary | ICD-10-CM | POA: Diagnosis not present

## 2018-01-25 DIAGNOSIS — M159 Polyosteoarthritis, unspecified: Secondary | ICD-10-CM | POA: Diagnosis not present

## 2018-01-25 DIAGNOSIS — I4891 Unspecified atrial fibrillation: Secondary | ICD-10-CM | POA: Diagnosis not present

## 2018-01-25 DIAGNOSIS — I10 Essential (primary) hypertension: Secondary | ICD-10-CM | POA: Diagnosis not present

## 2018-02-07 DIAGNOSIS — M170 Bilateral primary osteoarthritis of knee: Secondary | ICD-10-CM | POA: Diagnosis not present

## 2018-02-07 DIAGNOSIS — M25561 Pain in right knee: Secondary | ICD-10-CM | POA: Diagnosis not present

## 2018-02-07 DIAGNOSIS — M1712 Unilateral primary osteoarthritis, left knee: Secondary | ICD-10-CM | POA: Diagnosis not present

## 2018-02-07 DIAGNOSIS — M25562 Pain in left knee: Secondary | ICD-10-CM | POA: Diagnosis not present

## 2018-02-09 DIAGNOSIS — M25561 Pain in right knee: Secondary | ICD-10-CM | POA: Diagnosis not present

## 2018-02-09 DIAGNOSIS — M1711 Unilateral primary osteoarthritis, right knee: Secondary | ICD-10-CM | POA: Diagnosis not present

## 2018-02-15 DIAGNOSIS — M25562 Pain in left knee: Secondary | ICD-10-CM | POA: Diagnosis not present

## 2018-02-15 DIAGNOSIS — M1712 Unilateral primary osteoarthritis, left knee: Secondary | ICD-10-CM | POA: Diagnosis not present

## 2018-02-16 DIAGNOSIS — M25561 Pain in right knee: Secondary | ICD-10-CM | POA: Diagnosis not present

## 2018-02-16 DIAGNOSIS — M1711 Unilateral primary osteoarthritis, right knee: Secondary | ICD-10-CM | POA: Diagnosis not present

## 2018-02-21 DIAGNOSIS — M1712 Unilateral primary osteoarthritis, left knee: Secondary | ICD-10-CM | POA: Diagnosis not present

## 2018-02-21 DIAGNOSIS — M25562 Pain in left knee: Secondary | ICD-10-CM | POA: Diagnosis not present

## 2018-02-23 DIAGNOSIS — M25561 Pain in right knee: Secondary | ICD-10-CM | POA: Diagnosis not present

## 2018-02-23 DIAGNOSIS — M1711 Unilateral primary osteoarthritis, right knee: Secondary | ICD-10-CM | POA: Diagnosis not present

## 2018-03-07 DIAGNOSIS — I10 Essential (primary) hypertension: Secondary | ICD-10-CM | POA: Diagnosis not present

## 2018-03-07 DIAGNOSIS — M159 Polyosteoarthritis, unspecified: Secondary | ICD-10-CM | POA: Diagnosis not present

## 2018-03-07 DIAGNOSIS — I4891 Unspecified atrial fibrillation: Secondary | ICD-10-CM | POA: Diagnosis not present

## 2018-04-13 DIAGNOSIS — N401 Enlarged prostate with lower urinary tract symptoms: Secondary | ICD-10-CM | POA: Diagnosis not present

## 2018-04-13 DIAGNOSIS — N200 Calculus of kidney: Secondary | ICD-10-CM | POA: Diagnosis not present

## 2018-04-13 DIAGNOSIS — Z7901 Long term (current) use of anticoagulants: Secondary | ICD-10-CM | POA: Diagnosis not present

## 2018-04-13 DIAGNOSIS — Z6841 Body Mass Index (BMI) 40.0 and over, adult: Secondary | ICD-10-CM | POA: Diagnosis not present

## 2018-04-13 DIAGNOSIS — I4891 Unspecified atrial fibrillation: Secondary | ICD-10-CM | POA: Diagnosis not present

## 2018-04-13 DIAGNOSIS — Z9889 Other specified postprocedural states: Secondary | ICD-10-CM | POA: Diagnosis not present

## 2018-04-13 DIAGNOSIS — N281 Cyst of kidney, acquired: Secondary | ICD-10-CM | POA: Diagnosis not present

## 2018-04-13 DIAGNOSIS — R972 Elevated prostate specific antigen [PSA]: Secondary | ICD-10-CM | POA: Diagnosis not present

## 2018-04-13 DIAGNOSIS — Z87442 Personal history of urinary calculi: Secondary | ICD-10-CM | POA: Diagnosis not present

## 2018-04-22 DIAGNOSIS — I10 Essential (primary) hypertension: Secondary | ICD-10-CM | POA: Diagnosis not present

## 2018-04-22 DIAGNOSIS — I4891 Unspecified atrial fibrillation: Secondary | ICD-10-CM | POA: Diagnosis not present

## 2018-04-22 DIAGNOSIS — R42 Dizziness and giddiness: Secondary | ICD-10-CM | POA: Diagnosis not present

## 2018-04-22 DIAGNOSIS — Z299 Encounter for prophylactic measures, unspecified: Secondary | ICD-10-CM | POA: Diagnosis not present

## 2018-04-22 DIAGNOSIS — Z6841 Body Mass Index (BMI) 40.0 and over, adult: Secondary | ICD-10-CM | POA: Diagnosis not present

## 2018-04-22 DIAGNOSIS — E7800 Pure hypercholesterolemia, unspecified: Secondary | ICD-10-CM | POA: Diagnosis not present

## 2018-04-27 DIAGNOSIS — I4891 Unspecified atrial fibrillation: Secondary | ICD-10-CM | POA: Diagnosis not present

## 2018-04-27 DIAGNOSIS — I10 Essential (primary) hypertension: Secondary | ICD-10-CM | POA: Diagnosis not present

## 2018-04-27 DIAGNOSIS — M159 Polyosteoarthritis, unspecified: Secondary | ICD-10-CM | POA: Diagnosis not present

## 2018-05-09 DIAGNOSIS — H81393 Other peripheral vertigo, bilateral: Secondary | ICD-10-CM | POA: Diagnosis not present

## 2018-05-09 DIAGNOSIS — G9009 Other idiopathic peripheral autonomic neuropathy: Secondary | ICD-10-CM | POA: Diagnosis not present

## 2018-05-09 DIAGNOSIS — I7389 Other specified peripheral vascular diseases: Secondary | ICD-10-CM | POA: Diagnosis not present

## 2018-05-27 DIAGNOSIS — M159 Polyosteoarthritis, unspecified: Secondary | ICD-10-CM | POA: Diagnosis not present

## 2018-05-27 DIAGNOSIS — I10 Essential (primary) hypertension: Secondary | ICD-10-CM | POA: Diagnosis not present

## 2018-05-27 DIAGNOSIS — I4891 Unspecified atrial fibrillation: Secondary | ICD-10-CM | POA: Diagnosis not present

## 2018-05-31 DIAGNOSIS — M170 Bilateral primary osteoarthritis of knee: Secondary | ICD-10-CM | POA: Diagnosis not present

## 2018-05-31 DIAGNOSIS — M25562 Pain in left knee: Secondary | ICD-10-CM | POA: Diagnosis not present

## 2018-05-31 DIAGNOSIS — M25561 Pain in right knee: Secondary | ICD-10-CM | POA: Diagnosis not present

## 2018-06-08 DIAGNOSIS — I4891 Unspecified atrial fibrillation: Secondary | ICD-10-CM | POA: Diagnosis not present

## 2018-06-08 DIAGNOSIS — Z299 Encounter for prophylactic measures, unspecified: Secondary | ICD-10-CM | POA: Diagnosis not present

## 2018-06-08 DIAGNOSIS — R42 Dizziness and giddiness: Secondary | ICD-10-CM | POA: Diagnosis not present

## 2018-06-08 DIAGNOSIS — Z23 Encounter for immunization: Secondary | ICD-10-CM | POA: Diagnosis not present

## 2018-06-08 DIAGNOSIS — I10 Essential (primary) hypertension: Secondary | ICD-10-CM | POA: Diagnosis not present

## 2018-06-08 DIAGNOSIS — Z6841 Body Mass Index (BMI) 40.0 and over, adult: Secondary | ICD-10-CM | POA: Diagnosis not present

## 2018-08-01 DIAGNOSIS — Z299 Encounter for prophylactic measures, unspecified: Secondary | ICD-10-CM | POA: Diagnosis not present

## 2018-08-01 DIAGNOSIS — R5383 Other fatigue: Secondary | ICD-10-CM | POA: Diagnosis not present

## 2018-08-01 DIAGNOSIS — I10 Essential (primary) hypertension: Secondary | ICD-10-CM | POA: Diagnosis not present

## 2018-08-01 DIAGNOSIS — Z0000 Encounter for general adult medical examination without abnormal findings: Secondary | ICD-10-CM | POA: Diagnosis not present

## 2018-08-01 DIAGNOSIS — Z79899 Other long term (current) drug therapy: Secondary | ICD-10-CM | POA: Diagnosis not present

## 2018-08-01 DIAGNOSIS — Z6841 Body Mass Index (BMI) 40.0 and over, adult: Secondary | ICD-10-CM | POA: Diagnosis not present

## 2018-08-01 DIAGNOSIS — Z1339 Encounter for screening examination for other mental health and behavioral disorders: Secondary | ICD-10-CM | POA: Diagnosis not present

## 2018-08-01 DIAGNOSIS — Z1211 Encounter for screening for malignant neoplasm of colon: Secondary | ICD-10-CM | POA: Diagnosis not present

## 2018-08-01 DIAGNOSIS — Z7189 Other specified counseling: Secondary | ICD-10-CM | POA: Diagnosis not present

## 2018-08-01 DIAGNOSIS — Z1331 Encounter for screening for depression: Secondary | ICD-10-CM | POA: Diagnosis not present

## 2018-08-01 DIAGNOSIS — Z125 Encounter for screening for malignant neoplasm of prostate: Secondary | ICD-10-CM | POA: Diagnosis not present

## 2018-08-01 DIAGNOSIS — E7800 Pure hypercholesterolemia, unspecified: Secondary | ICD-10-CM | POA: Diagnosis not present

## 2018-08-10 DIAGNOSIS — I10 Essential (primary) hypertension: Secondary | ICD-10-CM | POA: Diagnosis not present

## 2018-08-10 DIAGNOSIS — M159 Polyosteoarthritis, unspecified: Secondary | ICD-10-CM | POA: Diagnosis not present

## 2018-08-10 DIAGNOSIS — I4891 Unspecified atrial fibrillation: Secondary | ICD-10-CM | POA: Diagnosis not present

## 2018-08-29 DIAGNOSIS — M25561 Pain in right knee: Secondary | ICD-10-CM | POA: Diagnosis not present

## 2018-08-29 DIAGNOSIS — M25562 Pain in left knee: Secondary | ICD-10-CM | POA: Diagnosis not present

## 2018-08-29 DIAGNOSIS — M1712 Unilateral primary osteoarthritis, left knee: Secondary | ICD-10-CM | POA: Diagnosis not present

## 2018-08-29 DIAGNOSIS — M170 Bilateral primary osteoarthritis of knee: Secondary | ICD-10-CM | POA: Diagnosis not present

## 2018-08-30 DIAGNOSIS — Z6841 Body Mass Index (BMI) 40.0 and over, adult: Secondary | ICD-10-CM | POA: Diagnosis not present

## 2018-08-30 DIAGNOSIS — Z87891 Personal history of nicotine dependence: Secondary | ICD-10-CM | POA: Diagnosis not present

## 2018-08-30 DIAGNOSIS — Z299 Encounter for prophylactic measures, unspecified: Secondary | ICD-10-CM | POA: Diagnosis not present

## 2018-08-30 DIAGNOSIS — Z0000 Encounter for general adult medical examination without abnormal findings: Secondary | ICD-10-CM | POA: Diagnosis not present

## 2018-08-31 DIAGNOSIS — M25561 Pain in right knee: Secondary | ICD-10-CM | POA: Diagnosis not present

## 2018-08-31 DIAGNOSIS — M1711 Unilateral primary osteoarthritis, right knee: Secondary | ICD-10-CM | POA: Diagnosis not present

## 2018-09-05 DIAGNOSIS — M25562 Pain in left knee: Secondary | ICD-10-CM | POA: Diagnosis not present

## 2018-09-05 DIAGNOSIS — M1712 Unilateral primary osteoarthritis, left knee: Secondary | ICD-10-CM | POA: Diagnosis not present

## 2018-09-07 DIAGNOSIS — I10 Essential (primary) hypertension: Secondary | ICD-10-CM | POA: Diagnosis not present

## 2018-09-07 DIAGNOSIS — I4891 Unspecified atrial fibrillation: Secondary | ICD-10-CM | POA: Diagnosis not present

## 2018-09-07 DIAGNOSIS — M159 Polyosteoarthritis, unspecified: Secondary | ICD-10-CM | POA: Diagnosis not present

## 2018-09-08 DIAGNOSIS — M1711 Unilateral primary osteoarthritis, right knee: Secondary | ICD-10-CM | POA: Diagnosis not present

## 2018-09-08 DIAGNOSIS — M25561 Pain in right knee: Secondary | ICD-10-CM | POA: Diagnosis not present

## 2018-09-12 DIAGNOSIS — M1712 Unilateral primary osteoarthritis, left knee: Secondary | ICD-10-CM | POA: Diagnosis not present

## 2018-09-12 DIAGNOSIS — M25562 Pain in left knee: Secondary | ICD-10-CM | POA: Diagnosis not present

## 2018-09-13 DIAGNOSIS — M25561 Pain in right knee: Secondary | ICD-10-CM | POA: Diagnosis not present

## 2018-09-13 DIAGNOSIS — M1711 Unilateral primary osteoarthritis, right knee: Secondary | ICD-10-CM | POA: Diagnosis not present

## 2018-09-19 DIAGNOSIS — M25562 Pain in left knee: Secondary | ICD-10-CM | POA: Diagnosis not present

## 2018-09-19 DIAGNOSIS — M1712 Unilateral primary osteoarthritis, left knee: Secondary | ICD-10-CM | POA: Diagnosis not present

## 2018-09-21 DIAGNOSIS — M1711 Unilateral primary osteoarthritis, right knee: Secondary | ICD-10-CM | POA: Diagnosis not present

## 2018-09-21 DIAGNOSIS — M25561 Pain in right knee: Secondary | ICD-10-CM | POA: Diagnosis not present

## 2018-09-27 DIAGNOSIS — M25562 Pain in left knee: Secondary | ICD-10-CM | POA: Diagnosis not present

## 2018-09-27 DIAGNOSIS — M1712 Unilateral primary osteoarthritis, left knee: Secondary | ICD-10-CM | POA: Diagnosis not present

## 2018-09-28 DIAGNOSIS — M25561 Pain in right knee: Secondary | ICD-10-CM | POA: Diagnosis not present

## 2018-09-28 DIAGNOSIS — M1711 Unilateral primary osteoarthritis, right knee: Secondary | ICD-10-CM | POA: Diagnosis not present

## 2018-10-05 DIAGNOSIS — I4891 Unspecified atrial fibrillation: Secondary | ICD-10-CM | POA: Diagnosis not present

## 2018-10-05 DIAGNOSIS — I10 Essential (primary) hypertension: Secondary | ICD-10-CM | POA: Diagnosis not present

## 2018-10-05 DIAGNOSIS — M159 Polyosteoarthritis, unspecified: Secondary | ICD-10-CM | POA: Diagnosis not present

## 2018-11-02 DIAGNOSIS — F4312 Post-traumatic stress disorder, chronic: Secondary | ICD-10-CM | POA: Diagnosis not present

## 2018-11-02 DIAGNOSIS — F341 Dysthymic disorder: Secondary | ICD-10-CM | POA: Diagnosis not present

## 2018-11-03 DIAGNOSIS — I10 Essential (primary) hypertension: Secondary | ICD-10-CM | POA: Diagnosis not present

## 2018-11-03 DIAGNOSIS — Z6841 Body Mass Index (BMI) 40.0 and over, adult: Secondary | ICD-10-CM | POA: Diagnosis not present

## 2018-11-03 DIAGNOSIS — Z299 Encounter for prophylactic measures, unspecified: Secondary | ICD-10-CM | POA: Diagnosis not present

## 2018-11-03 DIAGNOSIS — G4730 Sleep apnea, unspecified: Secondary | ICD-10-CM | POA: Diagnosis not present

## 2018-11-03 DIAGNOSIS — I4891 Unspecified atrial fibrillation: Secondary | ICD-10-CM | POA: Diagnosis not present

## 2018-11-04 DIAGNOSIS — I10 Essential (primary) hypertension: Secondary | ICD-10-CM | POA: Diagnosis not present

## 2018-11-04 DIAGNOSIS — I4891 Unspecified atrial fibrillation: Secondary | ICD-10-CM | POA: Diagnosis not present

## 2018-11-04 DIAGNOSIS — M159 Polyosteoarthritis, unspecified: Secondary | ICD-10-CM | POA: Diagnosis not present

## 2018-12-06 DIAGNOSIS — M159 Polyosteoarthritis, unspecified: Secondary | ICD-10-CM | POA: Diagnosis not present

## 2018-12-06 DIAGNOSIS — I4891 Unspecified atrial fibrillation: Secondary | ICD-10-CM | POA: Diagnosis not present

## 2018-12-06 DIAGNOSIS — I10 Essential (primary) hypertension: Secondary | ICD-10-CM | POA: Diagnosis not present

## 2019-01-04 DIAGNOSIS — M170 Bilateral primary osteoarthritis of knee: Secondary | ICD-10-CM | POA: Diagnosis not present

## 2019-01-04 DIAGNOSIS — M1712 Unilateral primary osteoarthritis, left knee: Secondary | ICD-10-CM | POA: Diagnosis not present

## 2019-01-04 DIAGNOSIS — M25562 Pain in left knee: Secondary | ICD-10-CM | POA: Diagnosis not present

## 2019-01-04 DIAGNOSIS — M25561 Pain in right knee: Secondary | ICD-10-CM | POA: Diagnosis not present

## 2019-01-10 DIAGNOSIS — I4891 Unspecified atrial fibrillation: Secondary | ICD-10-CM | POA: Diagnosis not present

## 2019-01-10 DIAGNOSIS — I10 Essential (primary) hypertension: Secondary | ICD-10-CM | POA: Diagnosis not present

## 2019-01-10 DIAGNOSIS — M159 Polyosteoarthritis, unspecified: Secondary | ICD-10-CM | POA: Diagnosis not present

## 2019-01-11 DIAGNOSIS — M1711 Unilateral primary osteoarthritis, right knee: Secondary | ICD-10-CM | POA: Diagnosis not present

## 2019-01-11 DIAGNOSIS — M25561 Pain in right knee: Secondary | ICD-10-CM | POA: Diagnosis not present

## 2019-01-19 DIAGNOSIS — M25562 Pain in left knee: Secondary | ICD-10-CM | POA: Diagnosis not present

## 2019-01-19 DIAGNOSIS — M1712 Unilateral primary osteoarthritis, left knee: Secondary | ICD-10-CM | POA: Diagnosis not present

## 2019-01-20 DIAGNOSIS — M1711 Unilateral primary osteoarthritis, right knee: Secondary | ICD-10-CM | POA: Diagnosis not present

## 2019-01-20 DIAGNOSIS — M25561 Pain in right knee: Secondary | ICD-10-CM | POA: Diagnosis not present

## 2019-01-25 DIAGNOSIS — M25562 Pain in left knee: Secondary | ICD-10-CM | POA: Diagnosis not present

## 2019-01-25 DIAGNOSIS — M1712 Unilateral primary osteoarthritis, left knee: Secondary | ICD-10-CM | POA: Diagnosis not present

## 2019-01-26 DIAGNOSIS — M25561 Pain in right knee: Secondary | ICD-10-CM | POA: Diagnosis not present

## 2019-01-26 DIAGNOSIS — M1711 Unilateral primary osteoarthritis, right knee: Secondary | ICD-10-CM | POA: Diagnosis not present

## 2019-02-01 DIAGNOSIS — M1712 Unilateral primary osteoarthritis, left knee: Secondary | ICD-10-CM | POA: Diagnosis not present

## 2019-02-01 DIAGNOSIS — M25562 Pain in left knee: Secondary | ICD-10-CM | POA: Diagnosis not present

## 2019-02-02 DIAGNOSIS — M25561 Pain in right knee: Secondary | ICD-10-CM | POA: Diagnosis not present

## 2019-02-02 DIAGNOSIS — M1711 Unilateral primary osteoarthritis, right knee: Secondary | ICD-10-CM | POA: Diagnosis not present

## 2019-02-03 DIAGNOSIS — I10 Essential (primary) hypertension: Secondary | ICD-10-CM | POA: Diagnosis not present

## 2019-02-03 DIAGNOSIS — I4891 Unspecified atrial fibrillation: Secondary | ICD-10-CM | POA: Diagnosis not present

## 2019-02-03 DIAGNOSIS — Z6841 Body Mass Index (BMI) 40.0 and over, adult: Secondary | ICD-10-CM | POA: Diagnosis not present

## 2019-02-03 DIAGNOSIS — Z299 Encounter for prophylactic measures, unspecified: Secondary | ICD-10-CM | POA: Diagnosis not present

## 2019-02-08 DIAGNOSIS — M1712 Unilateral primary osteoarthritis, left knee: Secondary | ICD-10-CM | POA: Diagnosis not present

## 2019-02-08 DIAGNOSIS — M25562 Pain in left knee: Secondary | ICD-10-CM | POA: Diagnosis not present

## 2019-02-09 DIAGNOSIS — M25561 Pain in right knee: Secondary | ICD-10-CM | POA: Diagnosis not present

## 2019-02-09 DIAGNOSIS — M1711 Unilateral primary osteoarthritis, right knee: Secondary | ICD-10-CM | POA: Diagnosis not present

## 2019-02-10 DIAGNOSIS — M159 Polyosteoarthritis, unspecified: Secondary | ICD-10-CM | POA: Diagnosis not present

## 2019-02-10 DIAGNOSIS — I4891 Unspecified atrial fibrillation: Secondary | ICD-10-CM | POA: Diagnosis not present

## 2019-02-10 DIAGNOSIS — I10 Essential (primary) hypertension: Secondary | ICD-10-CM | POA: Diagnosis not present

## 2019-02-28 ENCOUNTER — Other Ambulatory Visit: Payer: Medicare Other

## 2019-02-28 ENCOUNTER — Other Ambulatory Visit: Payer: Self-pay

## 2019-02-28 DIAGNOSIS — Z20822 Contact with and (suspected) exposure to covid-19: Secondary | ICD-10-CM

## 2019-02-28 DIAGNOSIS — R6889 Other general symptoms and signs: Secondary | ICD-10-CM | POA: Diagnosis not present

## 2019-03-04 LAB — NOVEL CORONAVIRUS, NAA: SARS-CoV-2, NAA: NOT DETECTED

## 2019-03-09 DIAGNOSIS — I10 Essential (primary) hypertension: Secondary | ICD-10-CM | POA: Diagnosis not present

## 2019-03-09 DIAGNOSIS — I4891 Unspecified atrial fibrillation: Secondary | ICD-10-CM | POA: Diagnosis not present

## 2019-03-09 DIAGNOSIS — M159 Polyosteoarthritis, unspecified: Secondary | ICD-10-CM | POA: Diagnosis not present

## 2019-04-14 DIAGNOSIS — N200 Calculus of kidney: Secondary | ICD-10-CM | POA: Diagnosis not present

## 2019-04-14 DIAGNOSIS — I10 Essential (primary) hypertension: Secondary | ICD-10-CM | POA: Diagnosis not present

## 2019-04-14 DIAGNOSIS — M159 Polyosteoarthritis, unspecified: Secondary | ICD-10-CM | POA: Diagnosis not present

## 2019-04-14 DIAGNOSIS — N400 Enlarged prostate without lower urinary tract symptoms: Secondary | ICD-10-CM | POA: Diagnosis not present

## 2019-04-14 DIAGNOSIS — I4891 Unspecified atrial fibrillation: Secondary | ICD-10-CM | POA: Diagnosis not present

## 2019-05-05 DIAGNOSIS — Z299 Encounter for prophylactic measures, unspecified: Secondary | ICD-10-CM | POA: Diagnosis not present

## 2019-05-05 DIAGNOSIS — Z6841 Body Mass Index (BMI) 40.0 and over, adult: Secondary | ICD-10-CM | POA: Diagnosis not present

## 2019-05-05 DIAGNOSIS — I10 Essential (primary) hypertension: Secondary | ICD-10-CM | POA: Diagnosis not present

## 2019-05-05 DIAGNOSIS — I4891 Unspecified atrial fibrillation: Secondary | ICD-10-CM | POA: Diagnosis not present

## 2019-05-16 DIAGNOSIS — I10 Essential (primary) hypertension: Secondary | ICD-10-CM | POA: Diagnosis not present

## 2019-05-16 DIAGNOSIS — M159 Polyosteoarthritis, unspecified: Secondary | ICD-10-CM | POA: Diagnosis not present

## 2019-05-16 DIAGNOSIS — I4891 Unspecified atrial fibrillation: Secondary | ICD-10-CM | POA: Diagnosis not present

## 2019-05-19 DIAGNOSIS — Z23 Encounter for immunization: Secondary | ICD-10-CM | POA: Diagnosis not present

## 2019-05-25 DIAGNOSIS — H40013 Open angle with borderline findings, low risk, bilateral: Secondary | ICD-10-CM | POA: Diagnosis not present

## 2019-06-07 DIAGNOSIS — H40023 Open angle with borderline findings, high risk, bilateral: Secondary | ICD-10-CM | POA: Diagnosis not present

## 2019-06-13 DIAGNOSIS — M159 Polyosteoarthritis, unspecified: Secondary | ICD-10-CM | POA: Diagnosis not present

## 2019-06-13 DIAGNOSIS — I4891 Unspecified atrial fibrillation: Secondary | ICD-10-CM | POA: Diagnosis not present

## 2019-06-13 DIAGNOSIS — I10 Essential (primary) hypertension: Secondary | ICD-10-CM | POA: Diagnosis not present

## 2019-06-14 DIAGNOSIS — M25562 Pain in left knee: Secondary | ICD-10-CM | POA: Diagnosis not present

## 2019-06-14 DIAGNOSIS — M1712 Unilateral primary osteoarthritis, left knee: Secondary | ICD-10-CM | POA: Diagnosis not present

## 2019-06-14 DIAGNOSIS — M25561 Pain in right knee: Secondary | ICD-10-CM | POA: Diagnosis not present

## 2019-06-14 DIAGNOSIS — M170 Bilateral primary osteoarthritis of knee: Secondary | ICD-10-CM | POA: Diagnosis not present

## 2019-06-21 DIAGNOSIS — M25561 Pain in right knee: Secondary | ICD-10-CM | POA: Diagnosis not present

## 2019-06-21 DIAGNOSIS — M1711 Unilateral primary osteoarthritis, right knee: Secondary | ICD-10-CM | POA: Diagnosis not present

## 2019-06-28 DIAGNOSIS — M25562 Pain in left knee: Secondary | ICD-10-CM | POA: Diagnosis not present

## 2019-06-28 DIAGNOSIS — M170 Bilateral primary osteoarthritis of knee: Secondary | ICD-10-CM | POA: Diagnosis not present

## 2019-06-29 DIAGNOSIS — M1711 Unilateral primary osteoarthritis, right knee: Secondary | ICD-10-CM | POA: Diagnosis not present

## 2019-06-29 DIAGNOSIS — M25561 Pain in right knee: Secondary | ICD-10-CM | POA: Diagnosis not present

## 2019-07-11 DIAGNOSIS — H43811 Vitreous degeneration, right eye: Secondary | ICD-10-CM | POA: Diagnosis not present

## 2019-08-07 DIAGNOSIS — E7800 Pure hypercholesterolemia, unspecified: Secondary | ICD-10-CM | POA: Diagnosis not present

## 2019-08-07 DIAGNOSIS — Z6841 Body Mass Index (BMI) 40.0 and over, adult: Secondary | ICD-10-CM | POA: Diagnosis not present

## 2019-08-07 DIAGNOSIS — R5383 Other fatigue: Secondary | ICD-10-CM | POA: Diagnosis not present

## 2019-08-07 DIAGNOSIS — Z7189 Other specified counseling: Secondary | ICD-10-CM | POA: Diagnosis not present

## 2019-08-07 DIAGNOSIS — F322 Major depressive disorder, single episode, severe without psychotic features: Secondary | ICD-10-CM | POA: Diagnosis not present

## 2019-08-07 DIAGNOSIS — Z1339 Encounter for screening examination for other mental health and behavioral disorders: Secondary | ICD-10-CM | POA: Diagnosis not present

## 2019-08-07 DIAGNOSIS — Z0000 Encounter for general adult medical examination without abnormal findings: Secondary | ICD-10-CM | POA: Diagnosis not present

## 2019-08-07 DIAGNOSIS — I10 Essential (primary) hypertension: Secondary | ICD-10-CM | POA: Diagnosis not present

## 2019-08-07 DIAGNOSIS — Z1331 Encounter for screening for depression: Secondary | ICD-10-CM | POA: Diagnosis not present

## 2019-08-07 DIAGNOSIS — Z299 Encounter for prophylactic measures, unspecified: Secondary | ICD-10-CM | POA: Diagnosis not present

## 2019-08-07 DIAGNOSIS — Z1211 Encounter for screening for malignant neoplasm of colon: Secondary | ICD-10-CM | POA: Diagnosis not present

## 2019-08-07 DIAGNOSIS — Z79899 Other long term (current) drug therapy: Secondary | ICD-10-CM | POA: Diagnosis not present

## 2019-08-07 DIAGNOSIS — Z125 Encounter for screening for malignant neoplasm of prostate: Secondary | ICD-10-CM | POA: Diagnosis not present

## 2019-08-22 DIAGNOSIS — I10 Essential (primary) hypertension: Secondary | ICD-10-CM | POA: Diagnosis not present

## 2019-08-22 DIAGNOSIS — M159 Polyosteoarthritis, unspecified: Secondary | ICD-10-CM | POA: Diagnosis not present

## 2019-08-22 DIAGNOSIS — I4891 Unspecified atrial fibrillation: Secondary | ICD-10-CM | POA: Diagnosis not present

## 2019-08-31 DIAGNOSIS — H43811 Vitreous degeneration, right eye: Secondary | ICD-10-CM | POA: Diagnosis not present

## 2019-09-21 DIAGNOSIS — I10 Essential (primary) hypertension: Secondary | ICD-10-CM | POA: Diagnosis not present

## 2019-09-21 DIAGNOSIS — I4891 Unspecified atrial fibrillation: Secondary | ICD-10-CM | POA: Diagnosis not present

## 2019-09-21 DIAGNOSIS — M159 Polyosteoarthritis, unspecified: Secondary | ICD-10-CM | POA: Diagnosis not present

## 2019-10-04 DIAGNOSIS — Z23 Encounter for immunization: Secondary | ICD-10-CM | POA: Diagnosis not present

## 2019-10-05 ENCOUNTER — Ambulatory Visit: Payer: Medicare Other

## 2019-10-11 DIAGNOSIS — M1712 Unilateral primary osteoarthritis, left knee: Secondary | ICD-10-CM | POA: Diagnosis not present

## 2019-10-11 DIAGNOSIS — M25561 Pain in right knee: Secondary | ICD-10-CM | POA: Diagnosis not present

## 2019-10-11 DIAGNOSIS — M170 Bilateral primary osteoarthritis of knee: Secondary | ICD-10-CM | POA: Diagnosis not present

## 2019-10-11 DIAGNOSIS — M25562 Pain in left knee: Secondary | ICD-10-CM | POA: Diagnosis not present

## 2019-10-13 DIAGNOSIS — M25561 Pain in right knee: Secondary | ICD-10-CM | POA: Diagnosis not present

## 2019-10-13 DIAGNOSIS — M170 Bilateral primary osteoarthritis of knee: Secondary | ICD-10-CM | POA: Diagnosis not present

## 2019-10-18 DIAGNOSIS — M1712 Unilateral primary osteoarthritis, left knee: Secondary | ICD-10-CM | POA: Diagnosis not present

## 2019-10-18 DIAGNOSIS — M25562 Pain in left knee: Secondary | ICD-10-CM | POA: Diagnosis not present

## 2019-10-19 DIAGNOSIS — M25561 Pain in right knee: Secondary | ICD-10-CM | POA: Diagnosis not present

## 2019-10-19 DIAGNOSIS — M1711 Unilateral primary osteoarthritis, right knee: Secondary | ICD-10-CM | POA: Diagnosis not present

## 2019-10-26 DIAGNOSIS — M170 Bilateral primary osteoarthritis of knee: Secondary | ICD-10-CM | POA: Diagnosis not present

## 2019-10-26 DIAGNOSIS — M25562 Pain in left knee: Secondary | ICD-10-CM | POA: Diagnosis not present

## 2019-11-01 DIAGNOSIS — Z23 Encounter for immunization: Secondary | ICD-10-CM | POA: Diagnosis not present

## 2019-11-02 DIAGNOSIS — M25561 Pain in right knee: Secondary | ICD-10-CM | POA: Diagnosis not present

## 2019-11-02 DIAGNOSIS — M170 Bilateral primary osteoarthritis of knee: Secondary | ICD-10-CM | POA: Diagnosis not present

## 2019-11-08 DIAGNOSIS — I10 Essential (primary) hypertension: Secondary | ICD-10-CM | POA: Diagnosis not present

## 2019-11-08 DIAGNOSIS — M25562 Pain in left knee: Secondary | ICD-10-CM | POA: Diagnosis not present

## 2019-11-08 DIAGNOSIS — Z299 Encounter for prophylactic measures, unspecified: Secondary | ICD-10-CM | POA: Diagnosis not present

## 2019-11-08 DIAGNOSIS — Z6841 Body Mass Index (BMI) 40.0 and over, adult: Secondary | ICD-10-CM | POA: Diagnosis not present

## 2019-11-08 DIAGNOSIS — Z713 Dietary counseling and surveillance: Secondary | ICD-10-CM | POA: Diagnosis not present

## 2019-11-08 DIAGNOSIS — M170 Bilateral primary osteoarthritis of knee: Secondary | ICD-10-CM | POA: Diagnosis not present

## 2019-11-08 DIAGNOSIS — M1710 Unilateral primary osteoarthritis, unspecified knee: Secondary | ICD-10-CM | POA: Diagnosis not present

## 2019-11-09 DIAGNOSIS — M170 Bilateral primary osteoarthritis of knee: Secondary | ICD-10-CM | POA: Diagnosis not present

## 2019-11-09 DIAGNOSIS — M25561 Pain in right knee: Secondary | ICD-10-CM | POA: Diagnosis not present

## 2019-11-14 DIAGNOSIS — I10 Essential (primary) hypertension: Secondary | ICD-10-CM | POA: Diagnosis not present

## 2019-11-14 DIAGNOSIS — I4891 Unspecified atrial fibrillation: Secondary | ICD-10-CM | POA: Diagnosis not present

## 2019-11-14 DIAGNOSIS — M159 Polyosteoarthritis, unspecified: Secondary | ICD-10-CM | POA: Diagnosis not present

## 2019-11-15 DIAGNOSIS — M170 Bilateral primary osteoarthritis of knee: Secondary | ICD-10-CM | POA: Diagnosis not present

## 2019-11-15 DIAGNOSIS — M25561 Pain in right knee: Secondary | ICD-10-CM | POA: Diagnosis not present

## 2019-11-16 DIAGNOSIS — M1711 Unilateral primary osteoarthritis, right knee: Secondary | ICD-10-CM | POA: Diagnosis not present

## 2019-11-16 DIAGNOSIS — M25561 Pain in right knee: Secondary | ICD-10-CM | POA: Diagnosis not present

## 2019-12-12 DIAGNOSIS — M159 Polyosteoarthritis, unspecified: Secondary | ICD-10-CM | POA: Diagnosis not present

## 2019-12-12 DIAGNOSIS — I4891 Unspecified atrial fibrillation: Secondary | ICD-10-CM | POA: Diagnosis not present

## 2019-12-12 DIAGNOSIS — I10 Essential (primary) hypertension: Secondary | ICD-10-CM | POA: Diagnosis not present

## 2019-12-22 DIAGNOSIS — I10 Essential (primary) hypertension: Secondary | ICD-10-CM | POA: Diagnosis not present

## 2020-01-02 ENCOUNTER — Telehealth: Payer: Self-pay | Admitting: Cardiovascular Disease

## 2020-01-02 NOTE — Telephone Encounter (Signed)
   1. What dental office are you calling from? Dr. Maretta [facility]   2. What is your office phone number? [phone]   3. What is your fax number? [phone]  4. What type of procedure is the patient having performed? Section of bridge and tooth extraction  5. What date is procedure scheduled or is the patient there now? 01/03/20  (if the patient is at the dentist's office question goes to their cardiologist if he/she is in the office.  If not, question should go to the DOD).   6. What is your question (ex. Antibiotics prior to procedure, holding medication-we need to know how long dentist wants pt to hold med)? They need to know if pt needs to hold blood thinner

## 2020-01-02 NOTE — Telephone Encounter (Signed)
Pt has appt with Cherlynn Polo, [facility] for pre op clearance. I will send clearance notes to [facility] for upcoming appt for pre op clearance. I will remove from the pre op call back pool. I will send a message to requesting office pt will need to be seen by cardiologist before clearance can be given.

## 2020-01-02 NOTE — Telephone Encounter (Signed)
Left message to please call the office to schedule pre op clearance appt with Dr. Sallyanne Kuster or APP. Pt last seen 06/2017.

## 2020-01-02 NOTE — Telephone Encounter (Signed)
   Primary Cardiologist:Mihai Croitoru, MD  Chart reviewed as part of pre-operative protocol coverage. Because of Izzy Bickhart past medical history and time since last visit, he/she will require a follow-up visit in order to better assess preoperative cardiovascular risk. Last OV was in 06/2017 so we cannot give updated recommendations at this time about holding anticoagulation without having seen the patient recently and knowing updated past medical history to ensure no other reasons for ongoing anticoagulation. Is he seeing a different cardiology practice now? It appears that his Eliquis was last filled by primary care in 2017 so would request the surgeon touch base with whoever has been filling blood thinner in the meantime.  Pre-op covering staff: - Please schedule appointment and call patient to inform them, if he is not following elsewhere now - Please contact requesting surgeon's office via preferred method (i.e, phone, fax) to inform them of need for appointment prior to surgery, and recommendations above  Charlie Pitter, PA-C  01/02/2020, 9:47 AM

## 2020-01-16 NOTE — Progress Notes (Signed)
Cardiology Office Note   Date:  01/17/2020   ID:  Robert Warren, DOB 23-Mar-1946, MRN [id number]  PCP:  Glenda Chroman, MD  Cardiologist:  Sanda Klein, MD EP: None  Chief Complaint  Patient presents with  . Pre-op Exam      History of Present Illness: Robert Warren is a 74 y.o. male with a PMH of paroxysmal atrial fibrillation, HTN, HLD, OSA on CPAP, and morbid obesity, who presents for preoperative assessment.  He was last evaluated by cardiology at an outpatient visit with Bernerd Pho, PA-C 06/2017 at which time he had complaints of intermittent dizziness. It was felt his symptoms may be related to hypotension and his lisinopril was reduced to 20mg  daily with consideration to reduce chlorthalidone if BP continued to be low on home monitoring. He was recommended to follow-up in 4 months though was lost to follow-up. His last ischemic evaluation was a NST 03/2016 which was without ischemia. Echo 02/2016 showed EF 55-60%, normal LV diastolic function, no RWMA, moderately reduced RV systolic function, and no significant valvular abnormalities  We received a request from his dental office for preoperative assessment, for which this appointment was scheduled. He has been doing okay from a cardiac standpoint since his last visit. Weight has fluctuated over the past couple years though overall has been stable. He is limited in activity by knee pain though denies any chest pain. He has chronic DOE which he attributes to deconditioning and weight. We discussed using a pool to increase his aerobic activity and he plans to look into joining the [facility] again. No complaints of palpitations. He does have some anxiety which is relieved with prn clonazepam. He has a log of his blood pressures which typically ranges in the 130s-150s/70s-80s with HR's in the 50s-60s. Occasionally noted to have irregular rhythms. He asks about the cardia mobile app to help determine if he's in atrial fibrillation.     Past Medical History:  Diagnosis Date  . Atrial fibrillation ([place])   . Hypertension   . Shortness of breath     Past Surgical History:  Procedure Laterality Date  . Anal cyst Removal    . COLONOSCOPY N/A 06/20/2014   Procedure: COLONOSCOPY;  Surgeon: Rogene Houston, MD;  [facility];  Service: Endoscopy;  Laterality: N/A;  1030     Current Outpatient Medications  Medication Sig Dispense Refill  . apixaban (ELIQUIS) 5 MG TABS tablet Take 1 tablet (5 mg total) by mouth 2 (two) times daily. 180 tablet 3  . buPROPion (WELLBUTRIN SR) 200 MG 12 hr tablet Take 200 mg by mouth 2 (two) times daily.     . chlorthalidone (HYGROTON) 25 MG tablet Take 1 tablet by mouth daily.    . clonazePAM (KLONOPIN) 0.5 MG tablet Take 0.5 mg by mouth 2 (two) times daily as needed for anxiety.     . hydrOXYzine (ATARAX/VISTARIL) 25 MG tablet Take 25-50 mg by mouth at bedtime.    Marland Kitchen lisinopril (PRINIVIL,ZESTRIL) 20 MG tablet Take 1 tablet (20 mg total) by mouth daily.    . metoprolol tartrate (LOPRESSOR) 25 MG tablet Take 1/2 tablet 12.5 mg twice a day 180 tablet 3  . QUEtiapine (SEROQUEL) 200 MG tablet Take 1 tablet by mouth at bedtime.    . rosuvastatin (CRESTOR) 40 MG tablet Take 40 mg by mouth daily.    . traZODone (DESYREL) 50 MG tablet Take 50 mg by mouth at bedtime.    . verapamil (CALAN-SR) 240 MG  CR tablet Take 120 mg by mouth at bedtime.      No current facility-administered medications for this visit.    Allergies:   Patient has no known allergies.    Social History:  The patient  reports that he quit smoking about 23 years ago. His smoking use included cigarettes. He has a 30.00 pack-year smoking history. He has never used smokeless tobacco. He reports that he does not drink alcohol or use drugs.   Family History:  The patient's family history includes Cancer - Lung in his father; Heart attack in his father; Heart disease in his mother.    ROS:  Please see the history of  present illness.   Otherwise, review of systems are positive for none.   All other systems are reviewed and negative.    PHYSICAL EXAM: VS:  BP (!) 144/96   Pulse 67   Ht 6\' 4"  (1.93 m)   Wt (!) 384 lb 6.4 oz (174.4 kg)   SpO2 97%   BMI 46.79 kg/m  , BMI Body mass index is 46.79 kg/m. GEN: Morbidly obese gentleman sitting on the exam table in no acute distress HEENT: sclera anicteric Neck: no JVD, carotid bruits, or masses Cardiac: RRR; no murmurs, rubs, or gallops,no edema  Respiratory:  clear to auscultation bilaterally, normal work of breathing GI: soft, obese, nontender, nondistended, + BS MS: no deformity or atrophy Skin: warm and dry, no rash Neuro:  Strength and sensation are intact Psych: euthymic mood, full affect   EKG:  EKG is ordered today. The ekg ordered today demonstrates poor quality EKG though appears to be NSR, rate 61 bpm, no obvious STE/D or TWI.    Recent Labs: No results found for requested labs within last 8760 hours.    Lipid Panel No results found for: CHOL, TRIG, HDL, CHOLHDL, VLDL, LDLCALC, LDLDIRECT    Wt Readings from Last 3 Encounters:  01/17/20 (!) 384 lb 6.4 oz (174.4 kg)  06/24/17 (!) 378 lb (171.5 kg)  12/17/16 (!) 381 lb (172.8 kg)      Other studies Reviewed: Additional studies/ records that were reviewed today include:   NST 2017:  Nuclear stress EF: 46%.  There was no ST segment deviation noted during stress.  The study is normal.  This is a low risk study.  The left ventricular ejection fraction is mildly decreased (45-54%).  Echocardiogram 2017: - Left ventricle: The cavity size was normal. There was moderate  concentric hypertrophy. Systolic function was normal. The  estimated ejection fraction was in the range of 55% to 60%. Wall  motion was normal; there were no regional wall motion  abnormalities. The transmitral flow pattern was normal. Left  ventricular diastolic function parameters were normal.   - Aortic valve: Trileaflet; mildly thickened, mildly calcified  leaflets.  - Right ventricle: The cavity size was moderately dilated. Wall  thickness was normal. Systolic function was moderately reduced.    ASSESSMENT AND PLAN:  1. Preoperative assessment: planned for a section bridge and single tooth extraction. Low risk procedure. No anginal complaints.  - Based on ACC/AHA guidelines, Robert Warren would be at acceptable risk for the planned procedure without further cardiovascular testing.  - Given only a single tooth extraction, no need to hold eliquis prior to his procedure.  - I will route this recommendation to the requesting party via Epic fax function  2. Paroxysmal atrial fibrillation: EKG with sinus rhythm today. No clear recurrence. No complaints of bleeding - Continue metoprolol tartrate and  verapamil for rate control - Continue eliquis for stroke ppx  3. HTN: BP 144/96 today; Cr 1.08 07/2019. - Will increase lisinopril - he is unclear what does he has been taking at home but reports he is taking 1/2 tab. He will verify whether he has 20mg  or 40mg  tablets. Plan will be to increase the dose to a full tablet and update his prescription to reflect this.  - Continue metoprolol tartrate and verapamil  4. HLD: no recent lipids on file; monitored by PCP - Continue rosuvastatin  5. OSA: on CPAP - Continue CPAP  6. Morbid obesity: limited in activity by knee pain.  - Continue to encourage dietary/lifestyle modifications to promote weight loss - recommended swimming as a means to increase his activity level.    Current medicines are reviewed at length with the patient today.  The patient does not have concerns regarding medicines.  The following changes have been made:  As above  Labs/ tests ordered today include:   Orders Placed This Encounter  Procedures  . EKG 12-Lead     Disposition:   FU with Dr. Sallyanne Kuster in 1 year  Signed, Abigail Butts, PA-C   01/17/2020 11:33 AM

## 2020-01-17 ENCOUNTER — Other Ambulatory Visit: Payer: Self-pay

## 2020-01-17 ENCOUNTER — Encounter: Payer: Self-pay | Admitting: Medical

## 2020-01-17 ENCOUNTER — Telehealth: Payer: Self-pay | Admitting: Medical

## 2020-01-17 ENCOUNTER — Ambulatory Visit (INDEPENDENT_AMBULATORY_CARE_PROVIDER_SITE_OTHER): Payer: Medicare Other | Admitting: Medical

## 2020-01-17 VITALS — BP 144/96 | HR 67 | Ht 76.0 in | Wt 384.4 lb

## 2020-01-17 DIAGNOSIS — E782 Mixed hyperlipidemia: Secondary | ICD-10-CM | POA: Diagnosis not present

## 2020-01-17 DIAGNOSIS — Z01810 Encounter for preprocedural cardiovascular examination: Secondary | ICD-10-CM | POA: Diagnosis not present

## 2020-01-17 DIAGNOSIS — I480 Paroxysmal atrial fibrillation: Secondary | ICD-10-CM

## 2020-01-17 DIAGNOSIS — G4733 Obstructive sleep apnea (adult) (pediatric): Secondary | ICD-10-CM

## 2020-01-17 DIAGNOSIS — I10 Essential (primary) hypertension: Secondary | ICD-10-CM | POA: Diagnosis not present

## 2020-01-17 DIAGNOSIS — Z5181 Encounter for therapeutic drug level monitoring: Secondary | ICD-10-CM

## 2020-01-17 MED ORDER — APIXABAN 5 MG PO TABS: 5.0000 mg | ORAL_TABLET | Freq: Two times a day (BID) | ORAL | 3 refills | Status: DC

## 2020-01-17 NOTE — Telephone Encounter (Signed)
This was the information needed from the appointment today.   Thanks!

## 2020-01-17 NOTE — Patient Instructions (Signed)
Medication Instructions:  SEND MYCHART MESSAGE OR CALL WITH CORRECT DOSE OF LISINOPRIL   *If you need a refill on your cardiac medications before your next appointment, please call your pharmacy*  Lab Work: NONE If you have labs (blood work) drawn today and your tests are completely normal, you will receive your results only by: Marland Kitchen MyChart Message (if you have MyChart) OR . A paper copy in the mail If you have any lab test that is abnormal or we need to change your treatment, we will call you to review the results.  Testing/Procedures: NONE  Follow-Up: At [facility], you and your health needs are our priority.  As part of our continuing mission to provide you with exceptional heart care, we have created designated Provider Care Teams.  These Care Teams include your primary Cardiologist (physician) and Advanced Practice Providers (APPs -  Physician Assistants and Nurse Practitioners) who all work together to provide you with the care you need, when you need it.  We recommend signing up for the patient portal called "MyChart".  Sign up information is provided on this After Visit Summary.  MyChart is used to connect with patients for Virtual Visits (Telemedicine).  Patients are able to view lab/test results, encounter notes, upcoming appointments, etc.  Non-urgent messages can be sent to your provider as well.   To learn more about what you can do with MyChart, go to [email or web address].    Your next appointment:   12 month(s) DR Sallyanne Kuster You will receive a reminder letter in the mail two months in advance. If you don't receive a letter, please call our office to schedule the follow-up appointment.  The format for your next appointment:   In Person  Provider:   You may see Sanda Klein, MD or one of the following Advanced Practice Providers on your designated Care Team:    Almyra Deforest, PA-C  Fabian Sharp, PA-C or   Roby Lofts, PA-C  Other Instructions NO NEED TO HOLD  [place]

## 2020-01-17 NOTE — Telephone Encounter (Signed)
Per patient taking Lisinopril 40 mg 1/2 tablet daily  Advised to increase to full tablet daily  Will forward to [place] PA for review

## 2020-01-17 NOTE — Telephone Encounter (Signed)
New Message   Pt is calling to leave information with Roby Lofts and her nurse  Pt says he was taking 40 mg Lisinopril and then another Doctor had him split the pill.    Please call back

## 2020-01-18 NOTE — Telephone Encounter (Signed)
Thanks for the update! Lets repeat a BMET in 2 weeks for close monitoring. Thanks!

## 2020-01-19 NOTE — Telephone Encounter (Signed)
Advised patient, verbalized understanding  

## 2020-01-21 DIAGNOSIS — M159 Polyosteoarthritis, unspecified: Secondary | ICD-10-CM | POA: Diagnosis not present

## 2020-01-21 DIAGNOSIS — I4891 Unspecified atrial fibrillation: Secondary | ICD-10-CM | POA: Diagnosis not present

## 2020-01-21 DIAGNOSIS — I10 Essential (primary) hypertension: Secondary | ICD-10-CM | POA: Diagnosis not present

## 2020-02-02 LAB — BASIC METABOLIC PANEL
BUN/Creatinine Ratio: 9 — ABNORMAL LOW (ref 10–24)
BUN: 9 mg/dL (ref 8–27)
CO2: 22 mmol/L (ref 20–29)
Calcium: 9.6 mg/dL (ref 8.6–10.2)
Chloride: 103 mmol/L (ref 96–106)
Creatinine, Ser: 1.03 mg/dL (ref 0.76–1.27)
GFR calc Af Amer: 84 mL/min/{1.73_m2} (ref >59)
GFR calc non Af Amer: 72 mL/min/{1.73_m2} (ref >59)
Glucose: 87 mg/dL (ref 65–99)
Potassium: 4.6 mmol/L (ref 3.5–5.2)
Sodium: 141 mmol/L (ref 134–144)

## 2020-02-21 DIAGNOSIS — M159 Polyosteoarthritis, unspecified: Secondary | ICD-10-CM | POA: Diagnosis not present

## 2020-02-21 DIAGNOSIS — M1711 Unilateral primary osteoarthritis, right knee: Secondary | ICD-10-CM | POA: Diagnosis not present

## 2020-02-21 DIAGNOSIS — M170 Bilateral primary osteoarthritis of knee: Secondary | ICD-10-CM | POA: Diagnosis not present

## 2020-02-21 DIAGNOSIS — M25561 Pain in right knee: Secondary | ICD-10-CM | POA: Diagnosis not present

## 2020-02-21 DIAGNOSIS — I4891 Unspecified atrial fibrillation: Secondary | ICD-10-CM | POA: Diagnosis not present

## 2020-02-21 DIAGNOSIS — I10 Essential (primary) hypertension: Secondary | ICD-10-CM | POA: Diagnosis not present

## 2020-02-22 DIAGNOSIS — M25562 Pain in left knee: Secondary | ICD-10-CM | POA: Diagnosis not present

## 2020-02-22 DIAGNOSIS — M1712 Unilateral primary osteoarthritis, left knee: Secondary | ICD-10-CM | POA: Diagnosis not present

## 2020-02-29 DIAGNOSIS — M1711 Unilateral primary osteoarthritis, right knee: Secondary | ICD-10-CM | POA: Diagnosis not present

## 2020-02-29 DIAGNOSIS — M25561 Pain in right knee: Secondary | ICD-10-CM | POA: Diagnosis not present

## 2020-03-01 DIAGNOSIS — M1712 Unilateral primary osteoarthritis, left knee: Secondary | ICD-10-CM | POA: Diagnosis not present

## 2020-03-01 DIAGNOSIS — M25562 Pain in left knee: Secondary | ICD-10-CM | POA: Diagnosis not present

## 2020-03-22 DIAGNOSIS — I4891 Unspecified atrial fibrillation: Secondary | ICD-10-CM | POA: Diagnosis not present

## 2020-03-22 DIAGNOSIS — I10 Essential (primary) hypertension: Secondary | ICD-10-CM | POA: Diagnosis not present

## 2020-03-22 DIAGNOSIS — M159 Polyosteoarthritis, unspecified: Secondary | ICD-10-CM | POA: Diagnosis not present

## 2020-04-03 DIAGNOSIS — I4891 Unspecified atrial fibrillation: Secondary | ICD-10-CM | POA: Diagnosis not present

## 2020-04-03 DIAGNOSIS — M159 Polyosteoarthritis, unspecified: Secondary | ICD-10-CM | POA: Diagnosis not present

## 2020-04-03 DIAGNOSIS — I10 Essential (primary) hypertension: Secondary | ICD-10-CM | POA: Diagnosis not present

## 2020-04-23 DIAGNOSIS — I10 Essential (primary) hypertension: Secondary | ICD-10-CM | POA: Diagnosis not present

## 2020-05-22 DIAGNOSIS — I10 Essential (primary) hypertension: Secondary | ICD-10-CM | POA: Diagnosis not present

## 2020-05-23 DIAGNOSIS — M159 Polyosteoarthritis, unspecified: Secondary | ICD-10-CM | POA: Diagnosis not present

## 2020-05-23 DIAGNOSIS — I77810 Thoracic aortic ectasia: Secondary | ICD-10-CM | POA: Diagnosis not present

## 2020-05-23 DIAGNOSIS — I10 Essential (primary) hypertension: Secondary | ICD-10-CM | POA: Diagnosis not present

## 2020-05-23 DIAGNOSIS — Z299 Encounter for prophylactic measures, unspecified: Secondary | ICD-10-CM | POA: Diagnosis not present

## 2020-05-23 DIAGNOSIS — Z6841 Body Mass Index (BMI) 40.0 and over, adult: Secondary | ICD-10-CM | POA: Diagnosis not present

## 2020-05-23 DIAGNOSIS — I429 Cardiomyopathy, unspecified: Secondary | ICD-10-CM | POA: Diagnosis not present

## 2020-05-23 DIAGNOSIS — I4891 Unspecified atrial fibrillation: Secondary | ICD-10-CM | POA: Diagnosis not present

## 2020-05-28 DIAGNOSIS — Z23 Encounter for immunization: Secondary | ICD-10-CM | POA: Diagnosis not present

## 2020-06-06 DIAGNOSIS — M1711 Unilateral primary osteoarthritis, right knee: Secondary | ICD-10-CM | POA: Diagnosis not present

## 2020-06-06 DIAGNOSIS — M25562 Pain in left knee: Secondary | ICD-10-CM | POA: Diagnosis not present

## 2020-06-06 DIAGNOSIS — M25561 Pain in right knee: Secondary | ICD-10-CM | POA: Diagnosis not present

## 2020-06-06 DIAGNOSIS — M170 Bilateral primary osteoarthritis of knee: Secondary | ICD-10-CM | POA: Diagnosis not present

## 2020-06-12 DIAGNOSIS — M1712 Unilateral primary osteoarthritis, left knee: Secondary | ICD-10-CM | POA: Diagnosis not present

## 2020-06-12 DIAGNOSIS — M25562 Pain in left knee: Secondary | ICD-10-CM | POA: Diagnosis not present

## 2020-06-13 DIAGNOSIS — M25561 Pain in right knee: Secondary | ICD-10-CM | POA: Diagnosis not present

## 2020-06-13 DIAGNOSIS — M1711 Unilateral primary osteoarthritis, right knee: Secondary | ICD-10-CM | POA: Diagnosis not present

## 2020-06-20 DIAGNOSIS — M1712 Unilateral primary osteoarthritis, left knee: Secondary | ICD-10-CM | POA: Diagnosis not present

## 2020-06-20 DIAGNOSIS — M25562 Pain in left knee: Secondary | ICD-10-CM | POA: Diagnosis not present

## 2020-06-21 DIAGNOSIS — M159 Polyosteoarthritis, unspecified: Secondary | ICD-10-CM | POA: Diagnosis not present

## 2020-06-21 DIAGNOSIS — I4891 Unspecified atrial fibrillation: Secondary | ICD-10-CM | POA: Diagnosis not present

## 2020-06-21 DIAGNOSIS — I10 Essential (primary) hypertension: Secondary | ICD-10-CM | POA: Diagnosis not present

## 2020-06-22 DIAGNOSIS — I10 Essential (primary) hypertension: Secondary | ICD-10-CM | POA: Diagnosis not present

## 2020-06-27 DIAGNOSIS — M25561 Pain in right knee: Secondary | ICD-10-CM | POA: Diagnosis not present

## 2020-06-27 DIAGNOSIS — M1711 Unilateral primary osteoarthritis, right knee: Secondary | ICD-10-CM | POA: Diagnosis not present

## 2020-06-28 DIAGNOSIS — M1712 Unilateral primary osteoarthritis, left knee: Secondary | ICD-10-CM | POA: Diagnosis not present

## 2020-06-28 DIAGNOSIS — M25562 Pain in left knee: Secondary | ICD-10-CM | POA: Diagnosis not present

## 2020-07-23 DIAGNOSIS — M159 Polyosteoarthritis, unspecified: Secondary | ICD-10-CM | POA: Diagnosis not present

## 2020-07-23 DIAGNOSIS — I10 Essential (primary) hypertension: Secondary | ICD-10-CM | POA: Diagnosis not present

## 2020-07-23 DIAGNOSIS — I4891 Unspecified atrial fibrillation: Secondary | ICD-10-CM | POA: Diagnosis not present

## 2020-08-02 DIAGNOSIS — H40023 Open angle with borderline findings, high risk, bilateral: Secondary | ICD-10-CM | POA: Diagnosis not present

## 2020-08-02 DIAGNOSIS — H43811 Vitreous degeneration, right eye: Secondary | ICD-10-CM | POA: Diagnosis not present

## 2020-08-02 DIAGNOSIS — H2513 Age-related nuclear cataract, bilateral: Secondary | ICD-10-CM | POA: Diagnosis not present

## 2020-08-02 DIAGNOSIS — H524 Presbyopia: Secondary | ICD-10-CM | POA: Diagnosis not present

## 2020-08-02 DIAGNOSIS — H18419 Arcus senilis, unspecified eye: Secondary | ICD-10-CM | POA: Diagnosis not present

## 2020-08-22 DIAGNOSIS — Z299 Encounter for prophylactic measures, unspecified: Secondary | ICD-10-CM | POA: Diagnosis not present

## 2020-08-22 DIAGNOSIS — I4891 Unspecified atrial fibrillation: Secondary | ICD-10-CM | POA: Diagnosis not present

## 2020-08-22 DIAGNOSIS — Z79899 Other long term (current) drug therapy: Secondary | ICD-10-CM | POA: Diagnosis not present

## 2020-08-22 DIAGNOSIS — Z125 Encounter for screening for malignant neoplasm of prostate: Secondary | ICD-10-CM | POA: Diagnosis not present

## 2020-08-22 DIAGNOSIS — I10 Essential (primary) hypertension: Secondary | ICD-10-CM | POA: Diagnosis not present

## 2020-08-22 DIAGNOSIS — M159 Polyosteoarthritis, unspecified: Secondary | ICD-10-CM | POA: Diagnosis not present

## 2020-08-22 DIAGNOSIS — Z1331 Encounter for screening for depression: Secondary | ICD-10-CM | POA: Diagnosis not present

## 2020-08-22 DIAGNOSIS — R5383 Other fatigue: Secondary | ICD-10-CM | POA: Diagnosis not present

## 2020-08-22 DIAGNOSIS — Z6841 Body Mass Index (BMI) 40.0 and over, adult: Secondary | ICD-10-CM | POA: Diagnosis not present

## 2020-08-22 DIAGNOSIS — Z0000 Encounter for general adult medical examination without abnormal findings: Secondary | ICD-10-CM | POA: Diagnosis not present

## 2020-08-22 DIAGNOSIS — E785 Hyperlipidemia, unspecified: Secondary | ICD-10-CM | POA: Diagnosis not present

## 2020-08-22 DIAGNOSIS — Z7189 Other specified counseling: Secondary | ICD-10-CM | POA: Diagnosis not present

## 2020-08-22 DIAGNOSIS — Z1339 Encounter for screening examination for other mental health and behavioral disorders: Secondary | ICD-10-CM | POA: Diagnosis not present

## 2020-08-22 DIAGNOSIS — Z1211 Encounter for screening for malignant neoplasm of colon: Secondary | ICD-10-CM | POA: Diagnosis not present

## 2020-09-06 DIAGNOSIS — I10 Essential (primary) hypertension: Secondary | ICD-10-CM | POA: Diagnosis not present

## 2020-10-02 ENCOUNTER — Encounter (INDEPENDENT_AMBULATORY_CARE_PROVIDER_SITE_OTHER): Payer: Self-pay | Admitting: *Deleted

## 2020-10-09 DIAGNOSIS — M1712 Unilateral primary osteoarthritis, left knee: Secondary | ICD-10-CM | POA: Diagnosis not present

## 2020-10-09 DIAGNOSIS — M170 Bilateral primary osteoarthritis of knee: Secondary | ICD-10-CM | POA: Diagnosis not present

## 2020-10-09 DIAGNOSIS — M25562 Pain in left knee: Secondary | ICD-10-CM | POA: Diagnosis not present

## 2020-10-10 DIAGNOSIS — M25561 Pain in right knee: Secondary | ICD-10-CM | POA: Diagnosis not present

## 2020-10-10 DIAGNOSIS — M1711 Unilateral primary osteoarthritis, right knee: Secondary | ICD-10-CM | POA: Diagnosis not present

## 2020-10-16 DIAGNOSIS — M25562 Pain in left knee: Secondary | ICD-10-CM | POA: Diagnosis not present

## 2020-10-16 DIAGNOSIS — M1712 Unilateral primary osteoarthritis, left knee: Secondary | ICD-10-CM | POA: Diagnosis not present

## 2020-10-17 DIAGNOSIS — M1711 Unilateral primary osteoarthritis, right knee: Secondary | ICD-10-CM | POA: Diagnosis not present

## 2020-10-17 DIAGNOSIS — M25561 Pain in right knee: Secondary | ICD-10-CM | POA: Diagnosis not present

## 2020-10-21 DIAGNOSIS — I10 Essential (primary) hypertension: Secondary | ICD-10-CM | POA: Diagnosis not present

## 2020-10-23 DIAGNOSIS — M25562 Pain in left knee: Secondary | ICD-10-CM | POA: Diagnosis not present

## 2020-10-23 DIAGNOSIS — M1712 Unilateral primary osteoarthritis, left knee: Secondary | ICD-10-CM | POA: Diagnosis not present

## 2020-10-24 DIAGNOSIS — M25561 Pain in right knee: Secondary | ICD-10-CM | POA: Diagnosis not present

## 2020-10-24 DIAGNOSIS — M1711 Unilateral primary osteoarthritis, right knee: Secondary | ICD-10-CM | POA: Diagnosis not present

## 2020-10-30 DIAGNOSIS — M25562 Pain in left knee: Secondary | ICD-10-CM | POA: Diagnosis not present

## 2020-10-30 DIAGNOSIS — M1712 Unilateral primary osteoarthritis, left knee: Secondary | ICD-10-CM | POA: Diagnosis not present

## 2020-10-31 DIAGNOSIS — M1711 Unilateral primary osteoarthritis, right knee: Secondary | ICD-10-CM | POA: Diagnosis not present

## 2020-10-31 DIAGNOSIS — M25561 Pain in right knee: Secondary | ICD-10-CM | POA: Diagnosis not present

## 2020-11-06 DIAGNOSIS — M1712 Unilateral primary osteoarthritis, left knee: Secondary | ICD-10-CM | POA: Diagnosis not present

## 2020-11-06 DIAGNOSIS — M25562 Pain in left knee: Secondary | ICD-10-CM | POA: Diagnosis not present

## 2020-11-07 DIAGNOSIS — M25561 Pain in right knee: Secondary | ICD-10-CM | POA: Diagnosis not present

## 2020-11-07 DIAGNOSIS — M1711 Unilateral primary osteoarthritis, right knee: Secondary | ICD-10-CM | POA: Diagnosis not present

## 2020-11-21 DIAGNOSIS — I10 Essential (primary) hypertension: Secondary | ICD-10-CM | POA: Diagnosis not present

## 2020-11-26 DIAGNOSIS — I4891 Unspecified atrial fibrillation: Secondary | ICD-10-CM | POA: Diagnosis not present

## 2020-11-26 DIAGNOSIS — I429 Cardiomyopathy, unspecified: Secondary | ICD-10-CM | POA: Diagnosis not present

## 2020-11-26 DIAGNOSIS — I10 Essential (primary) hypertension: Secondary | ICD-10-CM | POA: Diagnosis not present

## 2020-11-26 DIAGNOSIS — Z299 Encounter for prophylactic measures, unspecified: Secondary | ICD-10-CM | POA: Diagnosis not present

## 2020-12-18 DIAGNOSIS — Z23 Encounter for immunization: Secondary | ICD-10-CM | POA: Diagnosis not present

## 2020-12-20 DIAGNOSIS — I10 Essential (primary) hypertension: Secondary | ICD-10-CM | POA: Diagnosis not present

## 2020-12-23 DIAGNOSIS — L918 Other hypertrophic disorders of the skin: Secondary | ICD-10-CM | POA: Diagnosis not present

## 2020-12-23 DIAGNOSIS — Z6841 Body Mass Index (BMI) 40.0 and over, adult: Secondary | ICD-10-CM | POA: Diagnosis not present

## 2020-12-23 DIAGNOSIS — Z299 Encounter for prophylactic measures, unspecified: Secondary | ICD-10-CM | POA: Diagnosis not present

## 2020-12-23 DIAGNOSIS — I10 Essential (primary) hypertension: Secondary | ICD-10-CM | POA: Diagnosis not present

## 2021-01-02 ENCOUNTER — Ambulatory Visit (INDEPENDENT_AMBULATORY_CARE_PROVIDER_SITE_OTHER): Payer: Medicare Other | Admitting: Gastroenterology

## 2021-01-21 DIAGNOSIS — I10 Essential (primary) hypertension: Secondary | ICD-10-CM | POA: Diagnosis not present

## 2021-02-19 DIAGNOSIS — M25561 Pain in right knee: Secondary | ICD-10-CM | POA: Diagnosis not present

## 2021-02-19 DIAGNOSIS — M1712 Unilateral primary osteoarthritis, left knee: Secondary | ICD-10-CM | POA: Diagnosis not present

## 2021-02-19 DIAGNOSIS — M170 Bilateral primary osteoarthritis of knee: Secondary | ICD-10-CM | POA: Diagnosis not present

## 2021-02-19 DIAGNOSIS — M25562 Pain in left knee: Secondary | ICD-10-CM | POA: Diagnosis not present

## 2021-02-20 ENCOUNTER — Other Ambulatory Visit: Payer: Self-pay | Admitting: Physical Medicine and Rehabilitation

## 2021-02-20 DIAGNOSIS — M25561 Pain in right knee: Secondary | ICD-10-CM | POA: Diagnosis not present

## 2021-02-20 DIAGNOSIS — R936 Abnormal findings on diagnostic imaging of limbs: Secondary | ICD-10-CM

## 2021-02-20 DIAGNOSIS — M1711 Unilateral primary osteoarthritis, right knee: Secondary | ICD-10-CM | POA: Diagnosis not present

## 2021-02-20 DIAGNOSIS — M25562 Pain in left knee: Secondary | ICD-10-CM | POA: Diagnosis not present

## 2021-02-20 DIAGNOSIS — I10 Essential (primary) hypertension: Secondary | ICD-10-CM | POA: Diagnosis not present

## 2021-02-27 ENCOUNTER — Other Ambulatory Visit (INDEPENDENT_AMBULATORY_CARE_PROVIDER_SITE_OTHER): Payer: Self-pay

## 2021-02-27 ENCOUNTER — Encounter (INDEPENDENT_AMBULATORY_CARE_PROVIDER_SITE_OTHER): Payer: Self-pay | Admitting: Gastroenterology

## 2021-02-27 ENCOUNTER — Other Ambulatory Visit: Payer: Self-pay

## 2021-02-27 ENCOUNTER — Encounter (INDEPENDENT_AMBULATORY_CARE_PROVIDER_SITE_OTHER): Payer: Self-pay

## 2021-02-27 ENCOUNTER — Telehealth: Payer: Self-pay

## 2021-02-27 ENCOUNTER — Telehealth (INDEPENDENT_AMBULATORY_CARE_PROVIDER_SITE_OTHER): Payer: Self-pay

## 2021-02-27 ENCOUNTER — Ambulatory Visit (INDEPENDENT_AMBULATORY_CARE_PROVIDER_SITE_OTHER): Payer: Medicare Other | Admitting: Gastroenterology

## 2021-02-27 ENCOUNTER — Encounter (INDEPENDENT_AMBULATORY_CARE_PROVIDER_SITE_OTHER): Payer: Self-pay | Admitting: *Deleted

## 2021-02-27 DIAGNOSIS — R195 Other fecal abnormalities: Secondary | ICD-10-CM

## 2021-02-27 MED ORDER — PEG 3350-KCL-NA BICARB-NACL 420 G PO SOLR: 4000.0000 mL | ORAL | 0 refills | Status: DC

## 2021-02-27 NOTE — Patient Instructions (Signed)
Schedule colonoscopy

## 2021-02-27 NOTE — Telephone Encounter (Signed)
Robert Warren, CMA  

## 2021-02-27 NOTE — Progress Notes (Signed)
Maylon Peppers, M.D. Gastroenterology & Hepatology [facility] [address] [place], [place] [address] Primary Care Physician: Glenda Chroman, MD [address] [place] [address]  Referring MD: PCP  Chief Complaint: Positive fecal occult blood test  History of Present Illness: Robert Warren is a 75 y.o. male with Pmh afib and HTN, who presents for evaluation of positive FOBT.  Patient denies having any complaint at the moment.  He states that he was referred to our office as he had a positive FOBT at his PCPs office.  The patient denies having any nausea, vomiting, fever, chills, hematochezia, melena, hematemesis, abdominal distention, abdominal pain, diarrhea, jaundice, pruritus. Has lost 16 lb ob the last motnhs after changing his diet.  States he saw some scant blood in his stool around 9 months ago once but it has not recurred. He states that about a year ago he has felt a stinging sensation in his rectum - has not felt any masses in the rectal area.   He usually has 1 BM every day but every 4 months or so he takes a laxative "to clean his GI system".  Last IPJ:ASNKN Last Colonoscopy:2015 - Examination performed to cecum. No evidence of recurrent polyps. Data diverticula at cecum, ascending and sigmoid colon. External hemorrhoids.  FHx: neg for any gastrointestinal/liver disease, father lung cancer Social: quit  smoking 25 years ago, neg alcohol or illicit drug use  Past Medical History: Past Medical History:  Diagnosis Date   Atrial fibrillation ([place])    Hypertension    Shortness of breath     Past Surgical History: Past Surgical History:  Procedure Laterality Date   Anal cyst Removal     COLONOSCOPY N/A 06/20/2014   Procedure: COLONOSCOPY;  Surgeon: Rogene Houston, MD;  [facility];  Service: Endoscopy;  Laterality: N/A;  89    Family History: Family History  Problem Relation Age of Onset   Heart  disease Mother    Cancer - Lung Father    Heart attack Father     Social History: Social History   Tobacco Use  Smoking Status Former   Packs/day: 1.00   Years: 30.00   Pack years: 30.00   Types: Cigarettes   Quit date: 08/23/1996   Years since quitting: 24.5  Smokeless Tobacco Never   Social History   Substance and Sexual Activity  Alcohol Use Yes   Comment: occasional   Social History   Substance and Sexual Activity  Drug Use No    Allergies: No Known Allergies  Medications: Current Outpatient Medications  Medication Sig Dispense Refill   acetaminophen (TYLENOL) 650 MG CR tablet Take 650 mg by mouth every 8 (eight) hours as needed for pain.     apixaban (ELIQUIS) 5 MG TABS tablet Take 1 tablet (5 mg total) by mouth 2 (two) times daily. 180 tablet 3   buPROPion (WELLBUTRIN SR) 200 MG 12 hr tablet Take 200 mg by mouth 2 (two) times daily.     chlorthalidone (HYGROTON) 25 MG tablet Take 1 tablet by mouth daily.     clonazePAM (KLONOPIN) 0.5 MG tablet Take 0.5 mg by mouth 2 (two) times daily as needed for anxiety.      hydrOXYzine (ATARAX/VISTARIL) 25 MG tablet Take 25-50 mg by mouth at bedtime.     lisinopril (ZESTRIL) 40 MG tablet Take 40 mg by mouth daily.     QUEtiapine (SEROQUEL) 200 MG tablet Take 1 tablet by mouth at  bedtime.     rosuvastatin (CRESTOR) 40 MG tablet Take 40 mg by mouth daily.     traZODone (DESYREL) 50 MG tablet Take 50 mg by mouth at bedtime.     verapamil (CALAN-SR) 240 MG CR tablet Take 120 mg by mouth at bedtime.      No current facility-administered medications for this visit.    Review of Systems: GENERAL: negative for malaise, night sweats HEENT: No changes in hearing or vision, no nose bleeds or other nasal problems. NECK: Negative for lumps, goiter, pain and significant neck swelling RESPIRATORY: Negative for cough, wheezing CARDIOVASCULAR: Negative for chest pain, leg swelling, palpitations, orthopnea GI: SEE  HPI MUSCULOSKELETAL: Negative for joint pain or swelling, back pain, and muscle pain. SKIN: Negative for lesions, rash PSYCH: Negative for sleep disturbance, mood disorder and recent psychosocial stressors. HEMATOLOGY Negative for prolonged bleeding, bruising easily, and swollen nodes. ENDOCRINE: Negative for cold or heat intolerance, polyuria, polydipsia and goiter. NEURO: negative for tremor, gait imbalance, syncope and seizures. The remainder of the review of systems is noncontributory.   Physical Exam: BP (!) 155/96 (BP Location: Right Arm, Patient Position: Sitting, Cuff Size: Large)   Pulse 65   Temp 98.7 F (37.1 C) (Oral)   Ht 6\' 4"  (1.93 m)   Wt (!) 369 lb 12.8 oz (167.7 kg)   BMI 45.01 kg/m  GENERAL: The patient is AO x3, in no acute distress. Obese. HEENT: Head is normocephalic and atraumatic. EOMI are intact. Mouth is well hydrated and without lesions. NECK: Supple. No masses LUNGS: Clear to auscultation. No presence of rhonchi/wheezing/rales. Adequate chest expansion HEART: RRR, normal s1 and s2. ABDOMEN: Soft, nontender, no guarding, no peritoneal signs, and nondistended. BS +. No masses. RECTAL EXAM:deferred EXTREMITIES: Without any cyanosis, clubbing, rash, lesions or edema. NEUROLOGIC: AOx3, no focal motor deficit. SKIN: no jaundice, no rashes   Imaging/Labs: as above  I personally reviewed and interpreted the available labs, imaging and endoscopic files.  Impression and Plan: Robert Warren is a 75 y.o. male with Pmh afib and HTN, who presents for evaluation of positive FOBT.  Patient had positive FOBT as colorectal cancer screening testing.  He had an isolated episode of rectal bleeding several months ago, which was likely related to hemorrhoidal bleeding as he also has presented some rectal discomfort.  We will proceed with a colonoscopy to explore this further.  Patient understood and agreed.  - Schedule colonoscopy  All questions were answered.       Maylon Peppers, MD Gastroenterology and Hepatology [facility]

## 2021-02-27 NOTE — H&P (View-Only) (Signed)
Maylon Peppers, M.D. Gastroenterology & Hepatology [facility] [address] [place], [place] [address] Primary Care Physician: Glenda Chroman, MD [address] [place] [address]  Referring MD: PCP  Chief Complaint: Positive fecal occult blood test  History of Present Illness: Robert Warren is a 75 y.o. male with Pmh afib and HTN, who presents for evaluation of positive FOBT.  Patient denies having any complaint at the moment.  He states that he was referred to our office as he had a positive FOBT at his PCPs office.  The patient denies having any nausea, vomiting, fever, chills, hematochezia, melena, hematemesis, abdominal distention, abdominal pain, diarrhea, jaundice, pruritus. Has lost 16 lb ob the last motnhs after changing his diet.  States he saw some scant blood in his stool around 9 months ago once but it has not recurred. He states that about a year ago he has felt a stinging sensation in his rectum - has not felt any masses in the rectal area.   He usually has 1 BM every day but every 4 months or so he takes a laxative "to clean his GI system".  Last XNT:ZGYFV Last Colonoscopy:2015 - Examination performed to cecum. No evidence of recurrent polyps. Data diverticula at cecum, ascending and sigmoid colon. External hemorrhoids.  FHx: neg for any gastrointestinal/liver disease, father lung cancer Social: quit  smoking 25 years ago, neg alcohol or illicit drug use  Past Medical History: Past Medical History:  Diagnosis Date   Atrial fibrillation ([place])    Hypertension    Shortness of breath     Past Surgical History: Past Surgical History:  Procedure Laterality Date   Anal cyst Removal     COLONOSCOPY N/A 06/20/2014   Procedure: COLONOSCOPY;  Surgeon: Rogene Houston, MD;  [facility];  Service: Endoscopy;  Laterality: N/A;  43    Family History: Family History  Problem Relation Age of Onset   Heart  disease Mother    Cancer - Lung Father    Heart attack Father     Social History: Social History   Tobacco Use  Smoking Status Former   Packs/day: 1.00   Years: 30.00   Pack years: 30.00   Types: Cigarettes   Quit date: 08/23/1996   Years since quitting: 24.5  Smokeless Tobacco Never   Social History   Substance and Sexual Activity  Alcohol Use Yes   Comment: occasional   Social History   Substance and Sexual Activity  Drug Use No    Allergies: No Known Allergies  Medications: Current Outpatient Medications  Medication Sig Dispense Refill   acetaminophen (TYLENOL) 650 MG CR tablet Take 650 mg by mouth every 8 (eight) hours as needed for pain.     apixaban (ELIQUIS) 5 MG TABS tablet Take 1 tablet (5 mg total) by mouth 2 (two) times daily. 180 tablet 3   buPROPion (WELLBUTRIN SR) 200 MG 12 hr tablet Take 200 mg by mouth 2 (two) times daily.     chlorthalidone (HYGROTON) 25 MG tablet Take 1 tablet by mouth daily.     clonazePAM (KLONOPIN) 0.5 MG tablet Take 0.5 mg by mouth 2 (two) times daily as needed for anxiety.      hydrOXYzine (ATARAX/VISTARIL) 25 MG tablet Take 25-50 mg by mouth at bedtime.     lisinopril (ZESTRIL) 40 MG tablet Take 40 mg by mouth daily.     QUEtiapine (SEROQUEL) 200 MG tablet Take 1 tablet by mouth at  bedtime.     rosuvastatin (CRESTOR) 40 MG tablet Take 40 mg by mouth daily.     traZODone (DESYREL) 50 MG tablet Take 50 mg by mouth at bedtime.     verapamil (CALAN-SR) 240 MG CR tablet Take 120 mg by mouth at bedtime.      No current facility-administered medications for this visit.    Review of Systems: GENERAL: negative for malaise, night sweats HEENT: No changes in hearing or vision, no nose bleeds or other nasal problems. NECK: Negative for lumps, goiter, pain and significant neck swelling RESPIRATORY: Negative for cough, wheezing CARDIOVASCULAR: Negative for chest pain, leg swelling, palpitations, orthopnea GI: SEE  HPI MUSCULOSKELETAL: Negative for joint pain or swelling, back pain, and muscle pain. SKIN: Negative for lesions, rash PSYCH: Negative for sleep disturbance, mood disorder and recent psychosocial stressors. HEMATOLOGY Negative for prolonged bleeding, bruising easily, and swollen nodes. ENDOCRINE: Negative for cold or heat intolerance, polyuria, polydipsia and goiter. NEURO: negative for tremor, gait imbalance, syncope and seizures. The remainder of the review of systems is noncontributory.   Physical Exam: BP (!) 155/96 (BP Location: Right Arm, Patient Position: Sitting, Cuff Size: Large)   Pulse 65   Temp 98.7 F (37.1 C) (Oral)   Ht 6\' 4"  (1.93 m)   Wt (!) 369 lb 12.8 oz (167.7 kg)   BMI 45.01 kg/m  GENERAL: The patient is AO x3, in no acute distress. Obese. HEENT: Head is normocephalic and atraumatic. EOMI are intact. Mouth is well hydrated and without lesions. NECK: Supple. No masses LUNGS: Clear to auscultation. No presence of rhonchi/wheezing/rales. Adequate chest expansion HEART: RRR, normal s1 and s2. ABDOMEN: Soft, nontender, no guarding, no peritoneal signs, and nondistended. BS +. No masses. RECTAL EXAM:deferred EXTREMITIES: Without any cyanosis, clubbing, rash, lesions or edema. NEUROLOGIC: AOx3, no focal motor deficit. SKIN: no jaundice, no rashes   Imaging/Labs: as above  I personally reviewed and interpreted the available labs, imaging and endoscopic files.  Impression and Plan: Robert Warren is a 75 y.o. male with Pmh afib and HTN, who presents for evaluation of positive FOBT.  Patient had positive FOBT as colorectal cancer screening testing.  He had an isolated episode of rectal bleeding several months ago, which was likely related to hemorrhoidal bleeding as he also has presented some rectal discomfort.  We will proceed with a colonoscopy to explore this further.  Patient understood and agreed.  - Schedule colonoscopy  All questions were answered.       Maylon Peppers, MD Gastroenterology and Hepatology [facility]

## 2021-02-27 NOTE — Telephone Encounter (Signed)
   [facility] Pre-operative Risk Assessment    Patient Name: Foch Rosenwald  DOB: 1946-05-22 MRN: [id number]  HEARTCARE STAFF:  - IMPORTANT!!!!!! Under Visit Info/Reason for Call, type in Other and utilize the format Clearance MM/DD/YY or Clearance TBD. Do not use dashes or single digits. - Please review there is not already an duplicate clearance open for this procedure. - If request is for dental extraction, please clarify the # of teeth to be extracted. - If the patient is currently at the dentist's office, call Pre-Op Callback Staff (MA/nurse) to input urgent request.  - If the patient is not currently in the dentist office, please route to the Pre-Op pool.  Request for surgical clearance:  What type of surgery is being performed? Colonoscopy   When is this surgery scheduled? 02/22/21   What type of clearance is required (medical clearance vs. Pharmacy clearance to hold med vs. Both)? Pharmacy  Are there any medications that need to be held prior to surgery and how long?Eliquis    Practice name and name of physician performing surgery? Dr Maylon Peppers   What is the office phone number? [phone]   7.   What is the office fax number? [phone]  8.   Anesthesia type (None, local, MAC, general) ? IV conscious sedation   Shella Maxim Meilani Edmundson 02/27/2021, 5:07 PM  _________________________________________________________________   (provider comments below)

## 2021-02-28 DIAGNOSIS — M170 Bilateral primary osteoarthritis of knee: Secondary | ICD-10-CM | POA: Diagnosis not present

## 2021-02-28 DIAGNOSIS — M25561 Pain in right knee: Secondary | ICD-10-CM | POA: Diagnosis not present

## 2021-02-28 DIAGNOSIS — M25562 Pain in left knee: Secondary | ICD-10-CM | POA: Diagnosis not present

## 2021-02-28 NOTE — Telephone Encounter (Signed)
Pt has appt 03/05/21 with Dr. Sallyanne Kuster for pre op clearance.

## 2021-02-28 NOTE — Telephone Encounter (Signed)
Patient with diagnosis of afib on Eliquis for anticoagulation.    Procedure: colonoscopy Date of procedure: 03/14/21  CHA2DS2-VASc Score = 2  This indicates a 2.2% annual risk of stroke. The patient's score is based upon: CHF History: No HTN History: Yes Diabetes History: No Stroke History: No Vascular Disease History: No Age Score: 1 Gender Score: 0    CrCl 110 ml/min Platelet count 213  Per office protocol, patient can hold Eliquis for 1-2 days prior to procedure.

## 2021-02-28 NOTE — Telephone Encounter (Signed)
As below, pt deemed needing preop appt. Provider can now reference anticoag recs below at time OV. Will remove from preop box.

## 2021-02-28 NOTE — Telephone Encounter (Signed)
I left a message for the pt on [phone] to call back to make an appt for pre op clearance. I then called the alternate # on file for the pt and s/w pt's wife (DPR). Pt wife made appt for the pt for his pre op clearance appt with Dr. Sallyanne Kuster 03/05/21 @ 9:20. Pt's wife thanked me for the help. Advised if the pt needs to change the appt to please call asap, though schedules are very full and not sure if we would be able to get him in before his procedure 03/14/21. I will send notes to MD for upcoming appt. Will send FYI to requesting office pt has appt 03/05/21.

## 2021-02-28 NOTE — Telephone Encounter (Signed)
Thanks, please keep [place] posted if he gets cleared in his OV. Thanks

## 2021-02-28 NOTE — Telephone Encounter (Addendum)
   Name: Onesimo Lingard  DOB: 07/21/1946  MRN: [id number]  Primary Cardiologist: Sanda Klein, MD  Chart reviewed as part of pre-operative protocol coverage for colonoscopy 03/14/21. Mr. Weckerly has history of PAF on Eliquis, HTN, HLD, OSA on CPAP, and morbid obesity. Last seen on 01/17/2020 by Roby Lofts, PA-C.    Because of Bladimir Auman past medical history and time since last visit, he will require a follow-up visit in order to better assess preoperative cardiovascular risk.  Pre-op covering staff: - Please schedule appointment and call patient to inform them. Once scheduled, please add "pre-op clearance" to the appointment notes so provider is aware. - Please contact requesting surgeon's office via preferred method (i.e, phone, fax) to inform them of need for appointment prior to surgery and if the colonoscopy is able to be pushed from its current date, given our current office availabilities. Please route back to the preop pool if this is the case.   I will also route this message to the pharmacy pool for input on holding Eliquis as requested below so that this information is available to the clearing provider at time of patient's appointment.   I will remove from the pre-op pool for now and pending the surgeon's response or rescheduled appointment.   Arvil Chaco, PA-C  02/28/2021, 9:24 AM

## 2021-03-03 ENCOUNTER — Ambulatory Visit
Admission: RE | Admit: 2021-03-03 | Discharge: 2021-03-03 | Disposition: A | Discharge status: Home / Self Care | Payer: Medicare Other | Source: Ambulatory Visit | Attending: Physical Medicine and Rehabilitation | Admitting: Physical Medicine and Rehabilitation

## 2021-03-03 ENCOUNTER — Other Ambulatory Visit: Payer: Self-pay

## 2021-03-03 ENCOUNTER — Ambulatory Visit: Payer: Medicare Other | Admitting: Cardiovascular Disease

## 2021-03-03 DIAGNOSIS — M25561 Pain in right knee: Secondary | ICD-10-CM

## 2021-03-03 DIAGNOSIS — R936 Abnormal findings on diagnostic imaging of limbs: Secondary | ICD-10-CM

## 2021-03-05 ENCOUNTER — Encounter: Payer: Self-pay | Admitting: Cardiovascular Disease

## 2021-03-05 ENCOUNTER — Ambulatory Visit (INDEPENDENT_AMBULATORY_CARE_PROVIDER_SITE_OTHER): Payer: Medicare Other | Admitting: Cardiovascular Disease

## 2021-03-05 ENCOUNTER — Other Ambulatory Visit: Payer: Self-pay

## 2021-03-05 VITALS — BP 149/87 | HR 53 | Ht 73.0 in | Wt 371.2 lb

## 2021-03-05 DIAGNOSIS — Z01810 Encounter for preprocedural cardiovascular examination: Secondary | ICD-10-CM

## 2021-03-05 DIAGNOSIS — R001 Bradycardia, unspecified: Secondary | ICD-10-CM

## 2021-03-05 DIAGNOSIS — I10 Essential (primary) hypertension: Secondary | ICD-10-CM

## 2021-03-05 DIAGNOSIS — E782 Mixed hyperlipidemia: Secondary | ICD-10-CM | POA: Diagnosis not present

## 2021-03-05 DIAGNOSIS — I480 Paroxysmal atrial fibrillation: Secondary | ICD-10-CM | POA: Diagnosis not present

## 2021-03-05 DIAGNOSIS — G4733 Obstructive sleep apnea (adult) (pediatric): Secondary | ICD-10-CM

## 2021-03-05 DIAGNOSIS — G4730 Sleep apnea, unspecified: Secondary | ICD-10-CM

## 2021-03-05 NOTE — Patient Instructions (Signed)
Medication Instructions:  No changes *If you need a refill on your cardiac medications before your next appointment, please call your pharmacy*   Lab Work: None ordered If you have labs (blood work) drawn today and your tests are completely normal, you will receive your results only by: [place] (if you have MyChart) OR A paper copy in the mail If you have any lab test that is abnormal or we need to change your treatment, we will call you to review the results.   Testing/Procedures: None ordered   Follow-Up: At [facility], you and your health needs are our priority.  As part of our continuing mission to provide you with exceptional heart care, we have created designated Provider Care Teams.  These Care Teams include your primary Cardiologist (physician) and Advanced Practice Providers (APPs -  Physician Assistants and Nurse Practitioners) who all work together to provide you with the care you need, when you need it.  We recommend signing up for the patient portal called "MyChart".  Sign up information is provided on this After Visit Summary.  MyChart is used to connect with patients for Virtual Visits (Telemedicine).  Patients are able to view lab/test results, encounter notes, upcoming appointments, etc.  Non-urgent messages can be sent to your provider as well.   To learn more about what you can do with MyChart, go to [email or web address].    Your next appointment:   12 month(s)  The format for your next appointment:   In Person  Provider:   You may see Sanda Klein, MD or one of the following Advanced Practice Providers on your designated Care Team:   Almyra Deforest, PA-C Fabian Sharp, [place] or  Roby Lofts, [place]  A referral has been placed to Dr. Evette Georges sleep clinic

## 2021-03-05 NOTE — Progress Notes (Signed)
Cardiology Office Note    Date:  03/09/2021   ID:  Robert Warren, DOB Jan 25, 1946, MRN [id number]  PCP:  Glenda Chroman, MD  Cardiologist:   Sanda Klein, MD   Chief Complaint  Patient presents with   Atrial Fibrillation   Pre-op Exam    History of Present Illness:  Robert Warren is a 75 y.o. male , Veteran of the [place] [employer], with a history of atrial fibrillation rapid ventricular response actually diagnosed during an episode of urosepsis in 2017. His echo showed evidence of cor pulmonale, but left ventricular systolic function is normal. He did not have any evidence of ischemia on a nuclear stress test in 2017. He takes a statin for hyperlipidemia and has a history of hypertension which is well compensated. He is morbidly obese. He has OSA and uses CPAP.   He has not had clinically detected episodes of atrial fibrillation but he describes occasional brief episodes of dyspnea at rest that resolved spontaneously.  It is possible that the spells are due to arrhythmia.    He denies orthopnea, PND, lower extremity edema, dizziness or syncope, falls or bleeding complications, focal neurological events.  He has not had recent problems with hematuria or dysuria.  Although he reports compliance with CPAP since 2015, his sleep is very poor.  He wakes up feeling tired and takes naps during the day.  He has not had a sleep clinic appointment in a while.  He is scheduled to undergo colonoscopy on 07 22 with Dr. Montez Morita.  On statin his most recent LDL cholesterol was 95.  He does not have diabetes mellitus.  He has normal renal function.  His blood pressure is a little high today, but checked at home yesterday his blood pressure is 132/70.  He developed junctional bradycardia on higher doses of AV nodal blocking agents.  Past Medical History:  Diagnosis Date   Atrial fibrillation ([place])    Hypertension    Shortness of breath     Past Surgical History:  Procedure Laterality Date    Anal cyst Removal     COLONOSCOPY N/A 06/20/2014   Procedure: COLONOSCOPY;  Surgeon: Rogene Houston, MD;  Location: [facility];  Service: Endoscopy;  Laterality: N/A;  1030    Current Medications: Outpatient Medications Prior to Visit  Medication Sig Dispense Refill   acetaminophen (TYLENOL) 650 MG CR tablet Take 650 mg by mouth every 8 (eight) hours as needed for pain.     apixaban (ELIQUIS) 5 MG TABS tablet Take 1 tablet (5 mg total) by mouth 2 (two) times daily. 180 tablet 3   buPROPion (WELLBUTRIN SR) 200 MG 12 hr tablet Take 200 mg by mouth daily as needed (Depression).     diclofenac Sodium (VOLTAREN) 1 % GEL Apply 2 g topically as needed (pain).     hydrOXYzine (ATARAX/VISTARIL) 25 MG tablet Take 25-50 mg by mouth daily as needed (Night sweats).     lisinopril (ZESTRIL) 40 MG tablet Take 40 mg by mouth daily.     QUEtiapine (SEROQUEL) 200 MG tablet Take 200 mg by mouth daily as needed (anxiety).     rosuvastatin (CRESTOR) 40 MG tablet Take 40 mg by mouth daily.     traZODone (DESYREL) 100 MG tablet Take 50 mg by mouth at bedtime as needed for sleep.     verapamil (VERELAN PM) 120 MG 24 hr capsule Take 120 mg by mouth at bedtime.      chlorthalidone (HYGROTON) 25 MG tablet  Take 1 tablet by mouth daily.     clonazePAM (KLONOPIN) 0.5 MG tablet Take 0.5 mg by mouth 2 (two) times daily as needed for anxiety.      oxybutynin (DITROPAN) 5 MG tablet Take 5 mg by mouth as needed.     polyethylene glycol-electrolytes (TRILYTE) 420 g solution Take 4,000 mLs by mouth as directed. (Patient not taking: No sig reported) 4000 mL 0   No facility-administered medications prior to visit.     Allergies:   Patient has no known allergies.   Social History   Socioeconomic History   Marital status: Married    Spouse name: Not on file   Number of children: Not on file   Years of education: Not on file   Highest education level: Not on file  Occupational History   Not on file  Tobacco Use    Smoking status: Former    Packs/day: 1.00    Years: 30.00    Pack years: 30.00    Types: Cigarettes    Quit date: 08/23/1996    Years since quitting: 24.5   Smokeless tobacco: Never  Vaping Use   Vaping Use: Never used  Substance and Sexual Activity   Alcohol use: Yes    Comment: occasional   Drug use: No   Sexual activity: Not on file  Other Topics Concern   Not on file  Social History Narrative   Not on file   Social Determinants of Health   Financial Resource Strain: Not on file  Food Insecurity: Not on file  Transportation Needs: Not on file  Physical Activity: Not on file  Stress: Not on file  Social Connections: Not on file     Family History:  The patient's family history includes Cancer - Lung in his father; Heart attack in his father; Heart disease in his mother.   ROS:   Please see the history of present illness.    ROS All other systems reviewed and are negative.   PHYSICAL EXAM:   VS:  BP (!) 149/87 (BP Location: Left Arm, Patient Position: Sitting, Cuff Size: Large)   Pulse (!) 53   Ht 6\' 1"  (1.854 m)   Wt (!) 371 lb 3.2 oz (168.4 kg)   SpO2 93%   BMI 48.97 kg/m     General: Alert, oriented x3, no distress, morbidly obese Head: no evidence of trauma, PERRL, EOMI, no exophtalmos or lid lag, no myxedema, no xanthelasma; normal ears, nose and oropharynx Neck: normal jugular venous pulsations and no hepatojugular reflux; brisk carotid pulses without delay and no carotid bruits Chest: clear to auscultation, no signs of consolidation by percussion or palpation, normal fremitus, symmetrical and full respiratory excursions Cardiovascular: normal position and quality of the apical impulse, regular rhythm, normal first and second heart sounds, no murmurs, rubs or gallops Abdomen: no tenderness or distention, no masses by palpation, no abnormal pulsatility or arterial bruits, normal bowel sounds, no hepatosplenomegaly Extremities: no clubbing, cyanosis or edema;  2+ radial, ulnar and brachial pulses bilaterally; 2+ right femoral, posterior tibial and dorsalis pedis pulses; 2+ left femoral, posterior tibial and dorsalis pedis pulses; no subclavian or femoral bruits Neurological: grossly nonfocal Psych: Normal mood and affect   Wt Readings from Last 3 Encounters:  03/05/21 (!) 371 lb 3.2 oz (168.4 kg)  02/27/21 (!) 369 lb 12.8 oz (167.7 kg)  01/17/20 (!) 384 lb 6.4 oz (174.4 kg)      Studies/Labs Reviewed:   EKG:  EKG is not ordered today.  Recent Labs: No results found for requested labs within last 8760 hours.     ASSESSMENT:    1. Paroxysmal atrial fibrillation (HCC)   2. Obstructive sleep apnea syndrome   3. Junctional bradycardia   4. Morbid obesity ([place])   5. Mixed hyperlipidemia   6. Essential hypertension   7. Preoperative cardiovascular examination      PLAN:  In order of problems listed above:  Afib: Some of his occasional episodes of mild dyspnea at rest may represent bursts of paroxysmal atrial fibrillation.  Overall he is minimally symptomatic.  He is compliant with anticoagulation and has not had any bleeding events.   CHADSVasc 2 (age, HTN). Bradycardia: Asymptomatic.  Avoid high doses of negative chronotropic agents since he has a history of junctional bradycardia.  At some point in the future it is possible that he will need a pacemaker. OSA: Congratulated him for his compliance with CPAP.  He may benefit from a follow-up sleep clinic evaluation. Morbid obesity: At risk for multiple complications due to his weight.  Strongly encouraged him to try to lose more weight. HLP: Acceptable lipid panel on current statin. HTN: Although his blood pressure is a little high today, at home it is much better controlled.  No changes were made to his medications today. Preop CV eval: At low risk for major complications with colonoscopy, but care needs to be taken with anesthesia due to his history of bradycardia and sleep apnea.   Embolic risk is relatively low and burden of atrial fibrillation appears to be very low.  Okay to temporarily stop the Eliquis for the procedure.     Medication Adjustments/Labs and Tests Ordered: Current medicines are reviewed at length with the patient today.  Concerns regarding medicines are outlined above.  Medication changes, Labs and Tests ordered today are listed in the Patient Instructions below. Patient Instructions  Medication Instructions:  No changes *If you need a refill on your cardiac medications before your next appointment, please call your pharmacy*   Lab Work: None ordered If you have labs (blood work) drawn today and your tests are completely normal, you will receive your results only by: [place] (if you have MyChart) OR A paper copy in the mail If you have any lab test that is abnormal or we need to change your treatment, we will call you to review the results.   Testing/Procedures: None ordered   Follow-Up: At [facility], you and your health needs are our priority.  As part of our continuing mission to provide you with exceptional heart care, we have created designated Provider Care Teams.  These Care Teams include your primary Cardiologist (physician) and Advanced Practice Providers (APPs -  Physician Assistants and Nurse Practitioners) who all work together to provide you with the care you need, when you need it.  We recommend signing up for the patient portal called "MyChart".  Sign up information is provided on this After Visit Summary.  MyChart is used to connect with patients for Virtual Visits (Telemedicine).  Patients are able to view lab/test results, encounter notes, upcoming appointments, etc.  Non-urgent messages can be sent to your provider as well.   To learn more about what you can do with MyChart, go to [email or web address].    Your next appointment:   12 month(s)  The format for your next appointment:   In Person  Provider:    You may see Sanda Klein, MD or one of the following Advanced Practice Providers on your designated  Care Team:   Almyra Deforest, PA-C Fabian Sharp, PA-C or  Roby Lofts, [place]  A referral has been placed to Dr. Evette Georges sleep clinic    Signed, Sanda Klein, MD  03/09/2021 11:11 AM    [address], [place], [place]  [address] Phone: [phone]; Fax: [phone]

## 2021-03-09 ENCOUNTER — Encounter: Payer: Self-pay | Admitting: Cardiovascular Disease

## 2021-03-10 NOTE — Patient Instructions (Addendum)
Your procedure is scheduled on: 03/14/2021  Report to [facility] at     9:30 AM.  Call this number if you have problems the morning of surgery: [phone]   Remember:              Follow Directions on the letter you received from Your Physician's office regarding the Bowel Prep              Hold Eliquis as 2 days prior to procedure as instructed in the letter               No Smoking the day of Procedure :   Take these medicines the morning of surgery with A SIP OF WATER: Bupropion, and Quetiapine( seroquel)   Do not wear jewelry, make-up or nail polish.    Do not bring valuables to the hospital.  Contacts, dentures or bridgework may not be worn into surgery.  .   Patients discharged the day of surgery will not be allowed to drive home.     Colonoscopy, Adult, Care After This sheet gives you information about how to care for yourself after your procedure. Your health care provider may also give you more specific instructions. If you have problems or questions, contact your health care provider. What can I expect after the procedure? After the procedure, it is common to have: A small amount of blood in your stool for 24 hours after the procedure. Some gas. Mild abdominal cramping or bloating.  Follow these instructions at home: General instructions  For the first 24 hours after the procedure: Do not drive or use machinery. Do not sign important documents. Do not drink alcohol. Do your regular daily activities at a slower pace than normal. Eat soft, easy-to-digest foods. Rest often. Take over-the-counter or prescription medicines only as told by your health care provider. It is up to you to get the results of your procedure. Ask your health care provider, or the department performing the procedure, when your results will be ready. Relieving cramping and bloating Try walking around when you have cramps or feel bloated. Apply heat to your abdomen as told by your health  care provider. Use a heat source that your health care provider recommends, such as a moist heat pack or a heating pad. Place a towel between your skin and the heat source. Leave the heat on for 20-30 minutes. Remove the heat if your skin turns bright red. This is especially important if you are unable to feel pain, heat, or cold. You may have a greater risk of getting burned. Eating and drinking Drink enough fluid to keep your urine clear or pale yellow. Resume your normal diet as instructed by your health care provider. Avoid heavy or fried foods that are hard to digest. Avoid drinking alcohol for as long as instructed by your health care provider. Contact a health care provider if: You have blood in your stool 2-3 days after the procedure. Get help right away if: You have more than a small spotting of blood in your stool. You pass large blood clots in your stool. Your abdomen is swollen. You have nausea or vomiting. You have a fever. You have increasing abdominal pain that is not relieved with medicine. This information is not intended to replace advice given to you by your health care provider. Make sure you discuss any questions you have with your health care provider. Document Released: 03/24/2004 Document Revised: 05/04/2016 Document Reviewed: 10/22/2015 Elsevier Interactive Patient Education  2018 [place].

## 2021-03-12 ENCOUNTER — Encounter (HOSPITAL_COMMUNITY)
Admission: RE | Admit: 2021-03-12 | Discharge: 2021-03-12 | Disposition: A | Discharge status: Home / Self Care | Payer: Medicare Other | Source: Ambulatory Visit | Attending: Gastroenterology | Admitting: Gastroenterology

## 2021-03-12 ENCOUNTER — Other Ambulatory Visit: Payer: Self-pay

## 2021-03-12 ENCOUNTER — Encounter (HOSPITAL_COMMUNITY): Payer: Self-pay

## 2021-03-12 HISTORY — DX: Sleep apnea, unspecified: G47.30

## 2021-03-12 HISTORY — DX: Anxiety disorder, unspecified: F41.9

## 2021-03-14 ENCOUNTER — Encounter (HOSPITAL_COMMUNITY)
Admission: RE | Disposition: A | Discharge status: Home / Self Care | Payer: Self-pay | Source: Home / Self Care | Attending: Gastroenterology

## 2021-03-14 ENCOUNTER — Ambulatory Visit (HOSPITAL_COMMUNITY): Payer: Medicare Other | Admitting: Anesthesiology

## 2021-03-14 ENCOUNTER — Other Ambulatory Visit: Payer: Self-pay

## 2021-03-14 ENCOUNTER — Encounter (HOSPITAL_COMMUNITY): Payer: Self-pay | Admitting: Gastroenterology

## 2021-03-14 ENCOUNTER — Ambulatory Visit (HOSPITAL_COMMUNITY)
Admission: RE | Admit: 2021-03-14 | Discharge: 2021-03-14 | Disposition: A | Discharge status: Home / Self Care | Payer: Medicare Other | Attending: Gastroenterology | Admitting: Gastroenterology

## 2021-03-14 DIAGNOSIS — K635 Polyp of colon: Secondary | ICD-10-CM | POA: Diagnosis not present

## 2021-03-14 DIAGNOSIS — D124 Benign neoplasm of descending colon: Secondary | ICD-10-CM | POA: Diagnosis not present

## 2021-03-14 DIAGNOSIS — K5730 Diverticulosis of large intestine without perforation or abscess without bleeding: Secondary | ICD-10-CM

## 2021-03-14 DIAGNOSIS — Z87891 Personal history of nicotine dependence: Secondary | ICD-10-CM | POA: Insufficient documentation

## 2021-03-14 DIAGNOSIS — I4891 Unspecified atrial fibrillation: Secondary | ICD-10-CM | POA: Diagnosis not present

## 2021-03-14 DIAGNOSIS — I10 Essential (primary) hypertension: Secondary | ICD-10-CM | POA: Diagnosis not present

## 2021-03-14 DIAGNOSIS — K648 Other hemorrhoids: Secondary | ICD-10-CM | POA: Insufficient documentation

## 2021-03-14 DIAGNOSIS — I480 Paroxysmal atrial fibrillation: Secondary | ICD-10-CM | POA: Diagnosis not present

## 2021-03-14 DIAGNOSIS — Z7901 Long term (current) use of anticoagulants: Secondary | ICD-10-CM | POA: Diagnosis not present

## 2021-03-14 DIAGNOSIS — D125 Benign neoplasm of sigmoid colon: Secondary | ICD-10-CM | POA: Diagnosis not present

## 2021-03-14 DIAGNOSIS — R195 Other fecal abnormalities: Secondary | ICD-10-CM

## 2021-03-14 DIAGNOSIS — Z79899 Other long term (current) drug therapy: Secondary | ICD-10-CM | POA: Diagnosis not present

## 2021-03-14 HISTORY — PX: POLYPECTOMY: SHX5525

## 2021-03-14 HISTORY — PX: COLONOSCOPY WITH PROPOFOL: SHX5780

## 2021-03-14 LAB — HM COLONOSCOPY

## 2021-03-14 SURGERY — COLONOSCOPY WITH PROPOFOL
Anesthesia: General

## 2021-03-14 MED ORDER — STERILE WATER FOR IRRIGATION IR SOLN
Status: DC | PRN
  Administered 2021-03-14: 200 mL

## 2021-03-14 MED ORDER — LACTATED RINGERS IV SOLN: INTRAVENOUS | Status: DC

## 2021-03-14 MED ORDER — PROPOFOL 10 MG/ML IV BOLUS
INTRAVENOUS | Status: DC | PRN
  Administered 2021-03-14: 50 mg via INTRAVENOUS
  Administered 2021-03-14: 100 mg via INTRAVENOUS
  Administered 2021-03-14 (×3): 50 mg via INTRAVENOUS

## 2021-03-14 NOTE — Transfer of Care (Signed)
Immediate Anesthesia Transfer of Care Note  Patient: Robert Warren  Procedure(s) Performed: COLONOSCOPY WITH PROPOFOL POLYPECTOMY  Patient Location: Short Stay  Anesthesia Type:General  Level of Consciousness: awake, alert  and oriented  Airway & Oxygen Therapy: Patient Spontanous Breathing  Post-op Assessment: Report given to RN and Post -op Vital signs reviewed and stable  Post vital signs: Reviewed and stable  Last Vitals:  Vitals Value Taken Time  BP    Temp    Pulse 71 03/14/21  1117  Resp    SpO2 97  03/14/21  1117    Last Pain:  Vitals:   03/14/21 1044  TempSrc:   PainSc: 0-No pain      Patients Stated Pain Goal: 6 ([id number])  Complications: No notable events documented.

## 2021-03-14 NOTE — Op Note (Signed)
[facility] Patient Name: Robert Warren Procedure Date: 03/14/2021 10:40 AM MRN: [id number] Date of Birth: 1946/05/14 Attending MD: Maylon Peppers ,  CSN: [id number] Age: 75 Admit Type: Outpatient Procedure:                Colonoscopy Indications:              Positive fecal FOB test Providers:                Maylon Peppers, Lambert Mody, Casimer Bilis, Technician, Kristine L. Risa Grill, Technician Referring MD:              Medicines:                Monitored Anesthesia Care Complications:            No immediate complications. Estimated Blood Loss:     Estimated blood loss: none. Procedure:                Pre-Anesthesia Assessment:                           - Prior to the procedure, a History and Physical                            was performed, and patient medications, allergies                            and sensitivities were reviewed. The patient's                            tolerance of previous anesthesia was reviewed.                           - The risks and benefits of the procedure and the                            sedation options and risks were discussed with the                            patient. All questions were answered and informed                            consent was obtained.                           - ASA Grade Assessment: III - A patient with severe                            systemic disease.                           After obtaining informed consent, the colonoscope  was passed under direct vision. Throughout the                            procedure, the patient's blood pressure, pulse, and                            oxygen saturations were monitored continuously. The                            [id number] scope was introduced through                            the anus and advanced to the the cecum, identified                            by  appendiceal orifice and ileocecal valve. The                            colonoscopy was performed without difficulty. The                            patient tolerated the procedure well. Scope In: 10:49:15 AM Scope Out: 11:12:26 AM Scope Withdrawal Time: 0 hours 17 minutes 7 seconds  Total Procedure Duration: 0 hours 23 minutes 11 seconds  Findings:      The perianal and digital rectal examinations were normal.      Multiple small and large-mouthed diverticula were found in the       descending colon, ascending colon and cecum.      A 5 mm polyp was found in the sigmoid colon. The polyp was sessile. The       polyp was removed with a cold snare. Resection and retrieval were       complete.      Non-bleeding internal hemorrhoids were found during retroflexion. The       hemorrhoids were small. Impression:               - Diverticulosis in the descending colon, in the                            ascending colon and in the cecum.                           - One 5 mm polyp in the sigmoid colon, removed with                            a cold snare. Resected and retrieved.                           - Non-bleeding internal hemorrhoids. Moderate Sedation:      Per Anesthesia Care Recommendation:           - Discharge patient to home (ambulatory).                           - Resume previous diet.                           -  Await pathology results.                           - Repeat colonoscopy for surveillance based on                            pathology results.                           Restart Eliquis tonight Procedure Code(s):        --- Professional ---                           [phone], Colonoscopy, flexible; with removal of                            tumor(s), polyp(s), or other lesion(s) by snare                            technique Diagnosis Code(s):        --- Professional ---                           K64.8, Other hemorrhoids                           K63.5, Polyp of colon                            R19.5, Other fecal abnormalities                           K57.30, Diverticulosis of large intestine without                            perforation or abscess without bleeding CPT copyright 2019 American Medical Association. All rights reserved. The codes documented in this report are preliminary and upon coder review may  be revised to meet current compliance requirements. Maylon Peppers, MD Maylon Peppers,  03/14/2021 11:17:45 AM This report has been signed electronically. Number of Addenda: 0

## 2021-03-14 NOTE — Anesthesia Postprocedure Evaluation (Signed)
Anesthesia Post Note  Patient: Robert Warren  Procedure(s) Performed: COLONOSCOPY WITH PROPOFOL POLYPECTOMY  Patient location during evaluation: Phase II Anesthesia Type: General Level of consciousness: awake and alert and oriented Pain management: pain level controlled Vital Signs Assessment: post-procedure vital signs reviewed and stable Respiratory status: spontaneous breathing and respiratory function stable Cardiovascular status: blood pressure returned to baseline and stable Postop Assessment: no apparent nausea or vomiting Anesthetic complications: no   No notable events documented.   Last Vitals:  Vitals:   03/14/21 0942 03/14/21 1117  BP: (!) 152/79 105/70  Pulse:  76  Resp: (!) 24 20  Temp: 37.1 C 36.6 C  SpO2: 95% 96%    Last Pain:  Vitals:   03/14/21 1118  TempSrc:   PainSc: 0-No pain                 Azaela Caracci C Reighlynn Swiney

## 2021-03-14 NOTE — Interval H&P Note (Signed)
History and Physical Interval Note:  03/14/2021 10:06 AM Robert Warren is a 75 y.o. male with Pmh afib and HTN, who presents for evaluation of positive FOBT.  Patient denies any new complaints, denies any nausea, vomiting, fever, chills, melena, hematochezia, abdominal pain or distention.   BP (!) 152/79   Temp 98.8 F (37.1 C) (Oral)   Resp (!) 24   Ht '6\' 4"'$  (1.93 m)   Wt (!) 166.9 kg   SpO2 95%   BMI 44.79 kg/m  GENERAL: The patient is AO x3, in no acute distress. Obese. HEENT: Head is normocephalic and atraumatic. EOMI are intact. Mouth is well hydrated and without lesions. NECK: Supple. No masses LUNGS: Clear to auscultation. No presence of rhonchi/wheezing/rales. Adequate chest expansion HEART: RRR, normal s1 and s2. ABDOMEN: Soft, nontender, no guarding, no peritoneal signs, and nondistended. BS +. No masses. EXTREMITIES: Without any cyanosis, clubbing, rash, lesions or edema. NEUROLOGIC: AOx3, no focal motor deficit. SKIN: no jaundice, no rashes    Robert Warren  has presented today for surgery, with the diagnosis of Positive FOBT.  The various methods of treatment have been discussed with the patient and family. After consideration of risks, benefits and other options for treatment, the patient has consented to  Procedure(s) with comments: COLONOSCOPY WITH PROPOFOL (N/A) - 10:50 as a surgical intervention.  The patient's history has been reviewed, patient examined, no change in status, stable for surgery.  I have reviewed the patient's chart and labs.  Questions were answered to the patient's satisfaction.     [name]

## 2021-03-14 NOTE — Anesthesia Preprocedure Evaluation (Signed)
Anesthesia Evaluation  Patient identified by MRN, date of birth, ID band Patient awake    Reviewed: Allergy & Precautions, NPO status , Patient's Chart, lab work & pertinent test results  Airway Mallampati: III  TM Distance: >3 FB Neck ROM: Full    Dental  (+) Dental Advisory Given, Missing, Caps,    Pulmonary shortness of breath and with exertion, sleep apnea and Continuous Positive Airway Pressure Ventilation , former smoker,    Pulmonary exam normal breath sounds clear to auscultation       Cardiovascular Exercise Tolerance: Poor hypertension, Pt. on medications Normal cardiovascular exam+ dysrhythmias Atrial Fibrillation  Rhythm:Regular Rate:Normal     Neuro/Psych Anxiety    GI/Hepatic negative GI ROS, (+)     substance abuse  alcohol use,   Endo/Other    Renal/GU Renal disease     Musculoskeletal   Abdominal   Peds  Hematology  (+) anemia ,   Anesthesia Other Findings   Reproductive/Obstetrics                            Anesthesia Physical Anesthesia Plan  ASA: 3  Anesthesia Plan: General   Post-op Pain Management:    Induction: Intravenous  PONV Risk Score and Plan: Propofol infusion  Airway Management Planned: Nasal Cannula and Natural Airway  Additional Equipment:   Intra-op Plan:   Post-operative Plan:   Informed Consent: I have reviewed the patients History and Physical, chart, labs and discussed the procedure including the risks, benefits and alternatives for the proposed anesthesia with the patient or authorized representative who has indicated his/her understanding and acceptance.     Dental advisory given  Plan Discussed with: CRNA and Surgeon  Anesthesia Plan Comments:         Anesthesia Quick Evaluation

## 2021-03-14 NOTE — Discharge Instructions (Addendum)
You are being discharged to home.  Resume your previous diet.  We are waiting for your pathology results.  Your physician has recommended a repeat colonoscopy for surveillance based on pathology results.  Restart Eliquis tonight 

## 2021-03-17 LAB — SURGICAL PATHOLOGY

## 2021-03-18 ENCOUNTER — Encounter (INDEPENDENT_AMBULATORY_CARE_PROVIDER_SITE_OTHER): Payer: Self-pay | Admitting: *Deleted

## 2021-03-19 ENCOUNTER — Encounter (HOSPITAL_COMMUNITY): Payer: Self-pay | Admitting: Gastroenterology

## 2021-03-21 DIAGNOSIS — I10 Essential (primary) hypertension: Secondary | ICD-10-CM | POA: Diagnosis not present

## 2021-03-31 DIAGNOSIS — Z6841 Body Mass Index (BMI) 40.0 and over, adult: Secondary | ICD-10-CM | POA: Diagnosis not present

## 2021-03-31 DIAGNOSIS — I10 Essential (primary) hypertension: Secondary | ICD-10-CM | POA: Diagnosis not present

## 2021-03-31 DIAGNOSIS — I4891 Unspecified atrial fibrillation: Secondary | ICD-10-CM | POA: Diagnosis not present

## 2021-03-31 DIAGNOSIS — Z299 Encounter for prophylactic measures, unspecified: Secondary | ICD-10-CM | POA: Diagnosis not present

## 2021-04-23 ENCOUNTER — Encounter: Payer: Self-pay | Admitting: Cardiovascular Disease

## 2021-04-23 ENCOUNTER — Ambulatory Visit (INDEPENDENT_AMBULATORY_CARE_PROVIDER_SITE_OTHER): Payer: Medicare Other | Admitting: Cardiovascular Disease

## 2021-04-23 ENCOUNTER — Other Ambulatory Visit: Payer: Self-pay

## 2021-04-23 DIAGNOSIS — I480 Paroxysmal atrial fibrillation: Secondary | ICD-10-CM

## 2021-04-23 DIAGNOSIS — I10 Essential (primary) hypertension: Secondary | ICD-10-CM | POA: Diagnosis not present

## 2021-04-23 DIAGNOSIS — G4733 Obstructive sleep apnea (adult) (pediatric): Secondary | ICD-10-CM

## 2021-04-23 DIAGNOSIS — Z7901 Long term (current) use of anticoagulants: Secondary | ICD-10-CM

## 2021-04-23 MED ORDER — HYDROCHLOROTHIAZIDE 12.5 MG PO CAPS: 12.5000 mg | ORAL_CAPSULE | Freq: Every day | ORAL | 3 refills | Status: DC

## 2021-04-23 NOTE — Patient Instructions (Addendum)
Medication Instructions:  START- Hydrochlorothiazide 12.5 mg by mouth daily  *If you need a refill on your cardiac medications before your next appointment, please call your pharmacy*   Lab Work: None Ordered   Testing/Procedures: None Ordered   Follow-Up: At [facility], you and your health needs are our priority.  As part of our continuing mission to provide you with exceptional heart care, we have created designated Provider Care Teams.  These Care Teams include your primary Cardiologist (physician) and Advanced Practice Providers (APPs -  Physician Assistants and Nurse Practitioners) who all work together to provide you with the care you need, when you need it.  We recommend signing up for the patient portal called "MyChart".  Sign up information is provided on this After Visit Summary.  MyChart is used to connect with patients for Virtual Visits (Telemedicine).  Patients are able to view lab/test results, encounter notes, upcoming appointments, etc.  Non-urgent messages can be sent to your provider as well.   To learn more about what you can do with MyChart, go to [email or web address].    Your next appointment:   6 month(s)  The format for your next appointment:   In Person  Provider:   You may see Shelva Majestic, MD or one of the following Advanced Practice Providers on your designated Care Team:   Almyra Deforest, PA-C Fabian Sharp, [place] or  Roby Lofts, [place]   Other Instructions Your physician recommends that you schedule a follow-up appointment in: with Dr Woody Seller

## 2021-04-23 NOTE — Progress Notes (Signed)
Cardiology Office Note    Date:  04/30/2021   ID:  Robert Warren, DOB 03/24/1946, MRN [id number]  PCP:  Glenda Chroman, MD  Cardiologist:  Shelva Majestic, MD (sleep); Dr. Sallyanne Kuster  Initial sleep evaluation with me.  History of Present Illness:  Robert Warren is a 75 y.o. male who is followed by Dr. Sallyanne Kuster for cardiology care.  He is a [place] [employer] veteran and has a history of atrial fibrillation.  A prior echo Doppler study had shown evidence for cor pulmonale but he had normal left ventricular systolic function.  He has a history of obstructive sleep apnea and has been on CPAP therapy since  2017 His DME company is [employer].  His initial evaluation was a home study with [facility] on March 20, 2016.  Reportedly on that study his apnea index was 58.0, RDI (AHI: RDI) was 74/h.  Central apnea index was 46.8/h.  He has a ResMed air sense 10 CPAP unit with a set up date on May 05, 2016.  When the patient was recently seen by Dr. Sallyanne Kuster on March 05, 2021, he admitted to poor sleep, nonrestorative sleep, and the need to take naps during the day.  Presently, Mr. Callow admits that he uses CPAP every night.  He typically goes to bed around 8 PM but does not go to sleep and usually watches television or reads.  He typically falls asleep around 1030 to 11 PM.  He does not put his CPAP mask until he is about to fall asleep.  He wakes up at 4 AM somewhat restless and usually for good at 5:45 AM.  He brought his machine with him to the office today.  Last night he had used his CPAP for 5 hours and 51 minutes and his AHI was 2.2.  We were subsequently able to get a download and over the past month he has used a 29 of 30 nights and 16 out of 30 nights had usage greater than 4 hours.  His average use was 4.6 hours.  He cannot sleep on his back.  His pressure averages 13.5 cm of water.  AHI was 6.1.  Central apnea index 3.0/h.  An Epworth Sleepiness Scale score was calculated in the office today and this  endorsed at 8.  He has a history of morbid obesity.  He is on anticoagulation with apixaban with his PAF.  He is on lisinopril 40 mg, verapamil 120 mg for hypertension.  He is on rosuvastatin 40 mg for hyperlipidemia.  He presents for his initial evaluation with me.   Past Medical History:  Diagnosis Date   Anxiety    Atrial fibrillation ([place])    Hypertension    Shortness of breath    Sleep apnea     Past Surgical History:  Procedure Laterality Date   Anal cyst Removal     COLONOSCOPY N/A 06/20/2014   Procedure: COLONOSCOPY;  Surgeon: Rogene Houston, MD;  Location: [facility];  Service: Endoscopy;  Laterality: N/A;  1030   COLONOSCOPY WITH PROPOFOL N/A 03/14/2021   Procedure: COLONOSCOPY WITH PROPOFOL;  Surgeon: Harvel Quale, MD;  Location: [facility];  Service: Gastroenterology;  Laterality: N/A;  10:50   POLYPECTOMY  03/14/2021   Procedure: POLYPECTOMY;  Surgeon: Harvel Quale, MD;  Location: [facility];  Service: Gastroenterology;;    Current Medications: Outpatient Medications Prior to Visit  Medication Sig Dispense Refill   acetaminophen (TYLENOL) 650 MG CR tablet Take 650 mg by  mouth every 8 (eight) hours as needed for pain.     apixaban (ELIQUIS) 5 MG TABS tablet Take 1 tablet (5 mg total) by mouth 2 (two) times daily. 180 tablet 3   buPROPion (WELLBUTRIN SR) 200 MG 12 hr tablet Take 200 mg by mouth daily as needed (Depression).     diclofenac Sodium (VOLTAREN) 1 % GEL Apply 2 g topically as needed (pain).     hydrOXYzine (ATARAX/VISTARIL) 25 MG tablet Take 25-50 mg by mouth daily as needed (Night sweats).     lisinopril (ZESTRIL) 40 MG tablet Take 40 mg by mouth daily.     QUEtiapine (SEROQUEL) 200 MG tablet Take 200 mg by mouth daily as needed (anxiety).     rosuvastatin (CRESTOR) 40 MG tablet Take 40 mg by mouth daily.     traZODone (DESYREL) 100 MG tablet Take 50 mg by mouth at bedtime as needed for sleep.     verapamil (VERELAN PM)  120 MG 24 hr capsule Take 120 mg by mouth at bedtime.      No facility-administered medications prior to visit.     Allergies:   Patient has no known allergies.   Social History   Socioeconomic History   Marital status: Married    Spouse name: Not on file   Number of children: Not on file   Years of education: Not on file   Highest education level: Not on file  Occupational History   Not on file  Tobacco Use   Smoking status: Former    Packs/day: 1.00    Years: 30.00    Pack years: 30.00    Types: Cigarettes    Quit date: 08/23/1996    Years since quitting: 24.7   Smokeless tobacco: Never  Vaping Use   Vaping Use: Never used  Substance and Sexual Activity   Alcohol use: Yes    Comment: occasional   Drug use: No   Sexual activity: Yes  Other Topics Concern   Not on file  Social History Narrative   Not on file   Social Determinants of Health   Financial Resource Strain: Not on file  Food Insecurity: Not on file  Transportation Needs: Not on file  Physical Activity: Not on file  Stress: Not on file  Social Connections: Not on file    Additional social history is notable that he is married for 48 years.  He has 2 children and 6 grandchildren.  He previously had worked as a Games developer at [employer].  He smoked from age 66 and quit smoking in 1999.  Family History:  The patient's family history includes Cancer - Lung in his father; Heart attack in his father; Heart disease in his mother.  His mother died at age 84 with heart disease and father died at age 44 with cancer.  ROS General: Negative; No fevers, chills, or night sweats; morbid obesity HEENT: Negative; No changes in vision or hearing, sinus congestion, difficulty swallowing Pulmonary: Negative; No cough, wheezing, shortness of breath, hemoptysis Cardiovascular: Positive for hypertension, PAF GI: Negative; No nausea, vomiting, diarrhea, or abdominal pain GU: Negative; No dysuria, hematuria, or  difficulty voiding Musculoskeletal: Negative; no myalgias, joint pain, or weakness Hematologic/Oncology: Negative; no easy bruising, bleeding Endocrine: Negative; no heat/cold intolerance; no diabetes Neuro: Negative; no changes in balance, headaches Skin: Negative; No rashes or skin lesions Psychiatric: Negative; No behavioral problems, depression Sleep: Positive for OSA, on therapy since September 2017.  Admits to fatigability, daytime sleepiness, cannot sleep on his  back; negative; No bruxism, restless legs, hypnogognic hallucinations, no cataplexy Other comprehensive 14 point system review is negative.   PHYSICAL EXAM:   VS:  BP (!) 144/90   Pulse (!) 58   Ht 6' 4"  (1.93 m)   Wt (!) 370 lb (167.8 kg)   SpO2 97%   BMI 45.04 kg/m     Repeat blood pressure by me was 158/90.  Wt Readings from Last 3 Encounters:  04/23/21 (!) 370 lb (167.8 kg)  03/14/21 (!) 368 lb (166.9 kg)  03/12/21 (!) 369 lb (167.4 kg)    General: Alert, oriented, no distress.  Skin: normal turgor, no rashes, warm and dry HEENT: Normocephalic, atraumatic. Pupils equal round and reactive to light; sclera anicteric; extraocular muscles intact;  Nose without nasal septal hypertrophy Mouth/Parynx benign; Mallinpatti scale Neck: Thick neck; no JVD, no carotid bruits; normal carotid upstroke Lungs: clear to ausculatation and percussion; no wheezing or rales Chest wall: without tenderness to palpitation Heart: PMI not displaced, RRR, s1 s2 normal, 1/6 systolic murmur, no diastolic murmur, no rubs, gallops, thrills, or heaves Abdomen: Central adiposity; soft, nontender; no hepatosplenomehaly, BS+; abdominal aorta nontender and not dilated by palpation. Back: no CVA tenderness Pulses 2+ Musculoskeletal: full range of motion, normal strength, no joint deformities Extremities: no clubbing cyanosis or edema, Homan's sign negative  Neurologic: grossly nonfocal; Cranial nerves grossly wnl Psychologic: Normal mood and  affect   Studies/Labs Reviewed:   EKG:  EKG is ordered today.  ECG (independently read by me): Sinus bradycardia at 58, 1st degree AV block  Recent Labs: BMP Latest Ref Rng & Units 02/02/2020 02/26/2016 02/25/2016  Glucose 65 - 99 mg/dL 87 96 123(H)  BUN 8 - 27 mg/dL 9 17 21(H)  Creatinine 0.76 - 1.27 mg/dL 1.03 1.35(H) 1.53(H)  BUN/Creat Ratio 10 - 24 9(L) - -  Sodium 134 - 144 mmol/L 141 140 137  Potassium 3.5 - 5.2 mmol/L 4.6 3.8 3.4(L)  Chloride 96 - 106 mmol/L 103 107 106  CO2 20 - 29 mmol/L 22 27 24   Calcium 8.6 - 10.2 mg/dL 9.6 8.4(L) 8.7(L)     Hepatic Function Latest Ref Rng & Units 02/26/2016 02/25/2016  Total Protein 6.5 - 8.1 g/dL 6.1(L) 7.2  Albumin 3.5 - 5.0 g/dL 2.5(L) 3.0(L)  AST 15 - 41 U/L 38 18  ALT 17 - 63 U/L 26 15(L)  Alk Phosphatase 38 - 126 U/L 60 68  Total Bilirubin 0.3 - 1.2 mg/dL 0.7 1.5(H)  Bilirubin, Direct 0.1 - 0.5 mg/dL - 0.4    CBC Latest Ref Rng & Units 02/26/2016 02/25/2016 02/24/2016  WBC 4.0 - 10.5 K/uL 5.7 12.7(H) 16.2(H)  Hemoglobin 13.0 - 17.0 g/dL 11.5(L) 12.8(L) 12.6(L)  Hematocrit 39.0 - 52.0 % 37.8(L) 41.2 40.0  Platelets 150 - 400 K/uL 185 200 207   Lab Results  Component Value Date   MCV 75.9 (L) 02/26/2016   MCV 75.7 (L) 02/25/2016   MCV 74.8 (L) 02/24/2016   Lab Results  Component Value Date   TSH 1.968 02/25/2016   No results found for: HGBA1C   BNP    Component Value Date/Time   BNP 173.7 (H) 02/24/2016 1950    ProBNP No results found for: PROBNP   Lipid Panel  No results found for: CHOL, TRIG, HDL, CHOLHDL, VLDL, LDLCALC, LDLDIRECT, LABVLDL   RADIOLOGY: No results found.   Additional studies/ records that were reviewed today include:  I was able to obtain the patient's prior sleep evaluation which was a home  study with [facility] on March 20, 2016 as noted above.  I reviewed the records of Dr. Sallyanne Kuster.  ASSESSMENT:    1. Obstructive sleep apnea syndrome   2. Paroxysmal atrial fibrillation (HCC)   3.  Anticoagulated   4. Essential hypertension   5. Morbid obesity [facility])     PLAN:  Mr. Randolf Sansoucie is a 75 year old African-American gentleman is a [place] [employer] veteran and has a history of atrial fibrillation with rapid ventricular response which was initially diagnosed in 2017 during an episode of urosepsis.  At that time, an echo Doppler study showed normal systolic function but there was evidence for cor pulmonale.  Nuclear stress test in 2017 did not show any evidence of ischemia.  He has a history of hyperlipidemia for which she has been on rosuvastatin.  He continues to be on anticoagulation with apixaban.  He was diagnosed with severe sleep apnea on initial home study in 2017 and apparently was set up by [facility] for CPAP therapy on May 05, 2016.  He has been on therapy ever since.  Presently, he has been going to bed at 8 PM but typically does not attempt to sleep until 1030 or 11.  He usually watches television in bed or reads but at times he does doze off during this..  I discussed with him optimal sleep hygiene.  I have recommended that he use bed predominantly for sleep and not to bend several hours in bed either reading or watching TV prior to attempting to sleep.  He often wakes up around 4 AM and may take his mask off to go to the bathroom and may not put it back on and he is up for good at 545.  I discussed with him optimal sleep duration for adult between 7 and 9 hours with a mean of 8.  He is compliant with 100% daily usage.  I had a lengthy discussion with him regarding sleep apnea and its effect on disruption of normal sleep architecture so its potential adverse cardiovascular consequences.  He has a history of hypertension as well as PAF.  I discussed its effect on blood pressure with arousals contributing to increased sympathetic rather than parasympathetic tone while sleeping, as well as its potential increased risk for nocturnal arrhythmias and recurrent atrial fibrillation.  In  addition with his significant paternal hypoxia I discussed implications with potential nocturnal ischemia both cardiac as well as cerebrovascular.  I also discussed its implications with reference to inflammation, GERD, and glucose.  Presently he cannot sleep on his back and I suspect this is due to significantly increased severity of his sleep apnea with supine sleep.  His CPAP has been set at a pressure range of 5 to 20 cm.  With his 95th percentile pressure at 13.5 with maximum average pressure at 14.5 cm of water I am changing his minimum pressure to 10 cm with maximum pressure continuing at 20 cm of water.  His ramp time is 5 minutes and I will increase his ramp start pressure to 6 cm of water.  He uses a Artist full face mask.  He sees Dr. Jerene Bears for his primary care.  Blood pressure today is elevated at 158/90 by me despite being on lisinopril 40 mg daily and verapamil 120 mg.  I have recommended the addition of HCTZ 12.5 mg to his medical regimen.  BMI is consistent with morbid obesity at 45.04.  I discussed the importance of weight loss as well as exercise  both for his cardiovascular health and also effects on his severe sleep apnea.  He will follow-up with Dr. Sallyanne Kuster for his cardiology care.  I will see him in 6 months for reevaluation.  Time spent: 40 minutes Medication Adjustments/Labs and Tests Ordered: Current medicines are reviewed at length with the patient today.  Concerns regarding medicines are outlined above.  Medication changes, Labs and Tests ordered today are listed in the Patient Instructions below. Patient Instructions  Medication Instructions:  START- Hydrochlorothiazide 12.5 mg by mouth daily  *If you need a refill on your cardiac medications before your next appointment, please call your pharmacy*   Lab Work: None Ordered   Testing/Procedures: None Ordered   Follow-Up: At [facility], you and your health needs are our priority.  As part of our  continuing mission to provide you with exceptional heart care, we have created designated Provider Care Teams.  These Care Teams include your primary Cardiologist (physician) and Advanced Practice Providers (APPs -  Physician Assistants and Nurse Practitioners) who all work together to provide you with the care you need, when you need it.  We recommend signing up for the patient portal called "MyChart".  Sign up information is provided on this After Visit Summary.  MyChart is used to connect with patients for Virtual Visits (Telemedicine).  Patients are able to view lab/test results, encounter notes, upcoming appointments, etc.  Non-urgent messages can be sent to your provider as well.   To learn more about what you can do with MyChart, go to [email or web address].    Your next appointment:   6 month(s)  The format for your next appointment:   In Person  Provider:   You may see Shelva Majestic, MD or one of the following Advanced Practice Providers on your designated Care Team:   Almyra Deforest, PA-C Fabian Sharp, [place] or  Roby Lofts, [place]   Other Instructions Your physician recommends that you schedule a follow-up appointment in: with Dr Woody Seller    Signed, Shelva Majestic, MD  04/30/2021 1:26 PM    [facility] [address] [place], [place], [place]  [address] Phone: [phone]

## 2021-04-30 ENCOUNTER — Encounter: Payer: Self-pay | Admitting: Cardiovascular Disease

## 2021-05-01 ENCOUNTER — Ambulatory Visit: Payer: Medicare Other | Admitting: Cardiovascular Disease

## 2021-05-23 DIAGNOSIS — I10 Essential (primary) hypertension: Secondary | ICD-10-CM | POA: Diagnosis not present

## 2021-05-23 DIAGNOSIS — E7800 Pure hypercholesterolemia, unspecified: Secondary | ICD-10-CM | POA: Diagnosis not present

## 2021-05-30 DIAGNOSIS — Z23 Encounter for immunization: Secondary | ICD-10-CM | POA: Diagnosis not present

## 2021-06-04 DIAGNOSIS — M25562 Pain in left knee: Secondary | ICD-10-CM | POA: Diagnosis not present

## 2021-06-04 DIAGNOSIS — M170 Bilateral primary osteoarthritis of knee: Secondary | ICD-10-CM | POA: Diagnosis not present

## 2021-06-04 DIAGNOSIS — M1712 Unilateral primary osteoarthritis, left knee: Secondary | ICD-10-CM | POA: Diagnosis not present

## 2021-06-04 DIAGNOSIS — M25561 Pain in right knee: Secondary | ICD-10-CM | POA: Diagnosis not present

## 2021-06-05 DIAGNOSIS — M7120 Synovial cyst of popliteal space [Baker], unspecified knee: Secondary | ICD-10-CM | POA: Diagnosis not present

## 2021-06-05 DIAGNOSIS — M25561 Pain in right knee: Secondary | ICD-10-CM | POA: Diagnosis not present

## 2021-06-05 DIAGNOSIS — M1711 Unilateral primary osteoarthritis, right knee: Secondary | ICD-10-CM | POA: Diagnosis not present

## 2021-06-11 DIAGNOSIS — M1712 Unilateral primary osteoarthritis, left knee: Secondary | ICD-10-CM | POA: Diagnosis not present

## 2021-06-11 DIAGNOSIS — M25562 Pain in left knee: Secondary | ICD-10-CM | POA: Diagnosis not present

## 2021-06-12 DIAGNOSIS — M25561 Pain in right knee: Secondary | ICD-10-CM | POA: Diagnosis not present

## 2021-06-12 DIAGNOSIS — M1711 Unilateral primary osteoarthritis, right knee: Secondary | ICD-10-CM | POA: Diagnosis not present

## 2021-06-18 DIAGNOSIS — M1712 Unilateral primary osteoarthritis, left knee: Secondary | ICD-10-CM | POA: Diagnosis not present

## 2021-06-18 DIAGNOSIS — M25562 Pain in left knee: Secondary | ICD-10-CM | POA: Diagnosis not present

## 2021-06-19 DIAGNOSIS — M1711 Unilateral primary osteoarthritis, right knee: Secondary | ICD-10-CM | POA: Diagnosis not present

## 2021-06-19 DIAGNOSIS — M25561 Pain in right knee: Secondary | ICD-10-CM | POA: Diagnosis not present

## 2021-06-23 DIAGNOSIS — I10 Essential (primary) hypertension: Secondary | ICD-10-CM | POA: Diagnosis not present

## 2021-06-25 DIAGNOSIS — M25562 Pain in left knee: Secondary | ICD-10-CM | POA: Diagnosis not present

## 2021-06-25 DIAGNOSIS — M1712 Unilateral primary osteoarthritis, left knee: Secondary | ICD-10-CM | POA: Diagnosis not present

## 2021-06-26 DIAGNOSIS — M1711 Unilateral primary osteoarthritis, right knee: Secondary | ICD-10-CM | POA: Diagnosis not present

## 2021-06-26 DIAGNOSIS — M25561 Pain in right knee: Secondary | ICD-10-CM | POA: Diagnosis not present

## 2021-07-02 DIAGNOSIS — M25562 Pain in left knee: Secondary | ICD-10-CM | POA: Diagnosis not present

## 2021-07-02 DIAGNOSIS — M1712 Unilateral primary osteoarthritis, left knee: Secondary | ICD-10-CM | POA: Diagnosis not present

## 2021-07-03 DIAGNOSIS — M1711 Unilateral primary osteoarthritis, right knee: Secondary | ICD-10-CM | POA: Diagnosis not present

## 2021-07-03 DIAGNOSIS — M25561 Pain in right knee: Secondary | ICD-10-CM | POA: Diagnosis not present

## 2021-07-23 DIAGNOSIS — I10 Essential (primary) hypertension: Secondary | ICD-10-CM | POA: Diagnosis not present

## 2021-07-30 DIAGNOSIS — Z79899 Other long term (current) drug therapy: Secondary | ICD-10-CM | POA: Diagnosis not present

## 2021-07-30 DIAGNOSIS — I10 Essential (primary) hypertension: Secondary | ICD-10-CM | POA: Diagnosis not present

## 2021-07-30 DIAGNOSIS — Z0000 Encounter for general adult medical examination without abnormal findings: Secondary | ICD-10-CM | POA: Diagnosis not present

## 2021-07-30 DIAGNOSIS — Z6841 Body Mass Index (BMI) 40.0 and over, adult: Secondary | ICD-10-CM | POA: Diagnosis not present

## 2021-07-30 DIAGNOSIS — R5383 Other fatigue: Secondary | ICD-10-CM | POA: Diagnosis not present

## 2021-07-30 DIAGNOSIS — Z125 Encounter for screening for malignant neoplasm of prostate: Secondary | ICD-10-CM | POA: Diagnosis not present

## 2021-07-30 DIAGNOSIS — Z299 Encounter for prophylactic measures, unspecified: Secondary | ICD-10-CM | POA: Diagnosis not present

## 2021-07-30 DIAGNOSIS — Z1331 Encounter for screening for depression: Secondary | ICD-10-CM | POA: Diagnosis not present

## 2021-07-30 DIAGNOSIS — E7800 Pure hypercholesterolemia, unspecified: Secondary | ICD-10-CM | POA: Diagnosis not present

## 2021-07-30 DIAGNOSIS — Z1339 Encounter for screening examination for other mental health and behavioral disorders: Secondary | ICD-10-CM | POA: Diagnosis not present

## 2021-07-30 DIAGNOSIS — Z7189 Other specified counseling: Secondary | ICD-10-CM | POA: Diagnosis not present

## 2021-08-04 DIAGNOSIS — H401111 Primary open-angle glaucoma, right eye, mild stage: Secondary | ICD-10-CM | POA: Diagnosis not present

## 2021-08-08 ENCOUNTER — Encounter: Payer: Self-pay | Admitting: Emergency Medicine

## 2021-08-08 ENCOUNTER — Other Ambulatory Visit: Payer: Self-pay

## 2021-08-08 ENCOUNTER — Ambulatory Visit
Admission: EM | Admit: 2021-08-08 | Discharge: 2021-08-08 | Disposition: A | Discharge status: Home / Self Care | Payer: Medicare Other | Attending: Family Medicine | Admitting: Family Medicine

## 2021-08-08 DIAGNOSIS — U071 COVID-19: Secondary | ICD-10-CM

## 2021-08-08 MED ORDER — PROMETHAZINE-DM 6.25-15 MG/5ML PO SYRP: 5.0000 mL | ORAL_SOLUTION | Freq: Four times a day (QID) | ORAL | 0 refills | Status: DC | PRN

## 2021-08-08 MED ORDER — MOLNUPIRAVIR EUA 200MG CAPSULE: 4.0000 | ORAL_CAPSULE | Freq: Two times a day (BID) | ORAL | 0 refills | Status: AC

## 2021-08-08 NOTE — ED Triage Notes (Signed)
Exposed to covid.  Home covid test today was positive.  Chills, sore throat, fever, cough.  Symptoms started on Tuesday.

## 2021-08-08 NOTE — ED Provider Notes (Signed)
[facility]    CSN: [id number] Arrival date & time: 08/08/21  1742      History   Chief Complaint No chief complaint on file.   HPI Robert Warren is a 75 y.o. male.   Patient presenting today with 3-day history of chills, sore throat, fever, cough, fatigue.  Denies chest pain, shortness of breath, abdominal pain, nausea vomiting or diarrhea.  Took a COVID test at home this afternoon that was positive.  So far taking Alka-Seltzer cold and flu, over-the-counter fever reducers with mild temporary benefit.  No known history of pulmonary disease, does have sleep apnea, atrial fibrillation on anticoagulation, chronic kidney disease, hyperlipidemia.   Past Medical History:  Diagnosis Date   Anxiety    Atrial fibrillation ([place])    Hypertension    Shortness of breath    Sleep apnea     Patient Active Problem List   Diagnosis Date Noted   Fecal occult blood test positive 02/27/2021   Current use of long term anticoagulation 06/24/2017   Paroxysmal atrial fibrillation ([place]) 08/27/2016   Junctional bradycardia 08/27/2016   OSA (obstructive sleep apnea) 08/27/2016   Hyperlipidemia 08/27/2016   UTI (lower urinary tract infection) 02/25/2016   BPH (benign prostatic hyperplasia) 02/25/2016   Morbid obesity ([place])    Atrial fibrillation with RVR ([place]) 02/24/2016   Anemia 02/24/2016   Hematuria of undiagnosed cause 02/24/2016   CKD (chronic kidney disease) 02/24/2016    Past Surgical History:  Procedure Laterality Date   Anal cyst Removal     COLONOSCOPY N/A 06/20/2014   Procedure: COLONOSCOPY;  Surgeon: Rogene Houston, MD;  [facility];  Service: Endoscopy;  Laterality: N/A;  1030   COLONOSCOPY WITH PROPOFOL N/A 03/14/2021   Procedure: COLONOSCOPY WITH PROPOFOL;  Surgeon: Harvel Quale, MD;  [facility];  Service: Gastroenterology;  Laterality: N/A;  10:50   POLYPECTOMY  03/14/2021   Procedure: POLYPECTOMY;  Surgeon: Harvel Quale, MD;  [facility];  Service: Gastroenterology;;       Home Medications    Prior to Admission medications   Medication Sig Start Date End Date Taking? Authorizing Provider  molnupiravir EUA (LAGEVRIO) 200 mg CAPS capsule Take 4 capsules (800 mg total) by mouth 2 (two) times daily for 5 days. 08/08/21 08/13/21 Yes Volney American, PA-C  promethazine-dextromethorphan (PROMETHAZINE-DM) 6.25-15 MG/5ML syrup Take 5 mLs by mouth 4 (four) times daily as needed. 08/08/21  Yes Volney American, PA-C  acetaminophen (TYLENOL) 650 MG CR tablet Take 650 mg by mouth every 8 (eight) hours as needed for pain.    [provider]  apixaban (ELIQUIS) 5 MG TABS tablet Take 1 tablet (5 mg total) by mouth 2 (two) times daily. 01/17/20   Kroeger, Lorelee Cover., PA-C  buPROPion (WELLBUTRIN SR) 200 MG 12 hr tablet Take 200 mg by mouth daily as needed (Depression). 12/10/15   [provider]  diclofenac Sodium (VOLTAREN) 1 % GEL Apply 2 g topically as needed (pain). 07/30/16   [provider]  hydrochlorothiazide (MICROZIDE) 12.5 MG capsule Take 1 capsule (12.5 mg total) by mouth daily. 04/23/21 07/22/21  Troy Sine, MD  hydrOXYzine (ATARAX/VISTARIL) 25 MG tablet Take 25-50 mg by mouth daily as needed (Night sweats). 11/03/19   [provider]  lisinopril (ZESTRIL) 40 MG tablet Take 40 mg by mouth daily.    [provider]  QUEtiapine (SEROQUEL) 200 MG tablet Take 200 mg by mouth daily as needed (anxiety).  07/06/16   [provider]  rosuvastatin (CRESTOR) 40 MG tablet Take 40 mg by mouth daily.    [provider]  traZODone (DESYREL) 100 MG tablet Take 50 mg by mouth at bedtime as needed for sleep. 01/09/16   [provider]  verapamil (VERELAN PM) 120 MG 24 hr capsule Take 120 mg by mouth at bedtime.     [provider]    Family History Family History  Problem Relation Age of Onset   Heart disease  Mother    Cancer - Lung Father    Heart attack Father     Social History Social History   Tobacco Use   Smoking status: Former    Packs/day: 1.00    Years: 30.00    Pack years: 30.00    Types: Cigarettes    Quit date: 08/23/1996    Years since quitting: 24.9   Smokeless tobacco: Never  Vaping Use   Vaping Use: Never used  Substance Use Topics   Alcohol use: Yes    Comment: occasional   Drug use: No     Allergies   Patient has no known allergies.   Review of Systems Review of Systems Per HPI  Physical Exam Triage Vital Signs ED Triage Vitals  Enc Vitals Group     BP 08/08/21 1856 (!) 151/84     Pulse Rate 08/08/21 1856 63     Resp 08/08/21 1856 18     Temp 08/08/21 1856 97.9 F (36.6 C)     Temp Source 08/08/21 1856 Temporal     SpO2 08/08/21 1856 97 %     Weight --      Height --      Head Circumference --      Peak Flow --      Pain Score 08/08/21 1855 4     Pain Loc --      Pain Edu? --      Excl. in [place]? --    No data found.  Updated Vital Signs BP (!) 151/84 (BP Location: Right Arm)    Pulse 63    Temp 97.9 F (36.6 C) (Temporal)    Resp 18    SpO2 97%   Visual Acuity Right Eye Distance:   Left Eye Distance:   Bilateral Distance:    Right Eye Near:   Left Eye Near:    Bilateral Near:     Physical Exam Vitals and nursing note reviewed.  Constitutional:      Appearance: He is well-developed.  HENT:     Head: Atraumatic.     Right Ear: External ear normal.     Left Ear: External ear normal.     Nose: Rhinorrhea present.     Mouth/Throat:     Pharynx: Posterior oropharyngeal erythema present. No oropharyngeal exudate.  Eyes:     Conjunctiva/sclera: Conjunctivae normal.     Pupils: Pupils are equal, round, and reactive to light.  Cardiovascular:     Rate and Rhythm: Normal rate and regular rhythm.  Pulmonary:     Effort: Pulmonary effort is normal. No respiratory distress.     Breath sounds: No wheezing or rales.  Musculoskeletal:         General: Normal range of motion.     Cervical back: Normal range of motion and neck supple.  Lymphadenopathy:     Cervical: No cervical adenopathy.  Skin:    General: Skin is warm and dry.  Neurological:     Mental  Status: He is alert and oriented to person, place, and time.  Psychiatric:        Behavior: Behavior normal.     UC Treatments / Results  Labs (all labs ordered are listed, but only abnormal results are displayed) Labs Reviewed - No data to display  EKG   Radiology No results found.  Procedures Procedures (including critical care time)  Medications Ordered in UC Medications - No data to display  Initial Impression / Assessment and Plan / UC Course  I have reviewed the triage vital signs and the nursing notes.  Pertinent labs & imaging results that were available during my care of the patient were reviewed by me and considered in my medical decision making (see chart for details).     Vital signs reassuring, exam overall reassuring and consistent with a viral upper respiratory infection.  We will treat with molnupiravir given high risk status, Phenergan DM, continued over-the-counter cold and congestion medications.  Supportive home care and return precautions reviewed.  Final Clinical Impressions(s) / UC Diagnoses   Final diagnoses:  [id number]   Discharge Instructions   None    ED Prescriptions     Medication Sig Dispense Auth. Provider   molnupiravir EUA (LAGEVRIO) 200 mg CAPS capsule Take 4 capsules (800 mg total) by mouth 2 (two) times daily for 5 days. 40 capsule Volney American, [place]   promethazine-dextromethorphan (PROMETHAZINE-DM) 6.25-15 MG/5ML syrup Take 5 mLs by mouth 4 (four) times daily as needed. 100 mL Volney American, [place]      PDMP not reviewed this encounter.   Volney American, [place] 08/08/21 1919

## 2021-08-13 DIAGNOSIS — I10 Essential (primary) hypertension: Secondary | ICD-10-CM | POA: Diagnosis not present

## 2021-08-13 DIAGNOSIS — U071 COVID-19: Secondary | ICD-10-CM | POA: Diagnosis not present

## 2021-08-13 DIAGNOSIS — Z299 Encounter for prophylactic measures, unspecified: Secondary | ICD-10-CM | POA: Diagnosis not present

## 2021-08-13 DIAGNOSIS — I4891 Unspecified atrial fibrillation: Secondary | ICD-10-CM | POA: Diagnosis not present

## 2021-08-22 DIAGNOSIS — E7800 Pure hypercholesterolemia, unspecified: Secondary | ICD-10-CM | POA: Diagnosis not present

## 2021-08-22 DIAGNOSIS — I4891 Unspecified atrial fibrillation: Secondary | ICD-10-CM | POA: Diagnosis not present

## 2021-08-22 DIAGNOSIS — I10 Essential (primary) hypertension: Secondary | ICD-10-CM | POA: Diagnosis not present

## 2021-09-03 DIAGNOSIS — I4891 Unspecified atrial fibrillation: Secondary | ICD-10-CM | POA: Diagnosis not present

## 2021-09-03 DIAGNOSIS — M170 Bilateral primary osteoarthritis of knee: Secondary | ICD-10-CM | POA: Diagnosis not present

## 2021-09-03 DIAGNOSIS — M25361 Other instability, right knee: Secondary | ICD-10-CM | POA: Diagnosis not present

## 2021-09-09 DIAGNOSIS — H25813 Combined forms of age-related cataract, bilateral: Secondary | ICD-10-CM | POA: Diagnosis not present

## 2021-09-09 DIAGNOSIS — H25013 Cortical age-related cataract, bilateral: Secondary | ICD-10-CM | POA: Diagnosis not present

## 2021-09-09 DIAGNOSIS — H40022 Open angle with borderline findings, high risk, left eye: Secondary | ICD-10-CM | POA: Diagnosis not present

## 2021-09-09 DIAGNOSIS — H401111 Primary open-angle glaucoma, right eye, mild stage: Secondary | ICD-10-CM | POA: Diagnosis not present

## 2021-09-09 DIAGNOSIS — H18419 Arcus senilis, unspecified eye: Secondary | ICD-10-CM | POA: Diagnosis not present

## 2021-09-09 DIAGNOSIS — H2513 Age-related nuclear cataract, bilateral: Secondary | ICD-10-CM | POA: Diagnosis not present

## 2021-09-21 DIAGNOSIS — I10 Essential (primary) hypertension: Secondary | ICD-10-CM | POA: Diagnosis not present

## 2021-09-23 DIAGNOSIS — I4891 Unspecified atrial fibrillation: Secondary | ICD-10-CM | POA: Diagnosis not present

## 2021-09-23 DIAGNOSIS — E7800 Pure hypercholesterolemia, unspecified: Secondary | ICD-10-CM | POA: Diagnosis not present

## 2021-09-23 NOTE — Progress Notes (Signed)
Office Visit    Patient Name: Robert Warren Date of Encounter: 09/24/2021  Primary Care Provider:  Glenda Chroman, MD Primary Cardiologist:  Sanda Klein, MD  Chief Complaint    76 year old male with a history of paroxysmal atrial fibrillation on Eliquis (CHA2DS2VASc =2) , bradycardia, hypertension, hyperlipidemia, OSA on CPAP, and morbid obesity who presents for follow-up related to atrial fibrillation.  Past Medical History    Past Medical History:  Diagnosis Date   Anxiety    Atrial fibrillation ([place])    Hypertension    Shortness of breath    Sleep apnea    Past Surgical History:  Procedure Laterality Date   Anal cyst Removal     COLONOSCOPY N/A 06/20/2014   Procedure: COLONOSCOPY;  Surgeon: Rogene Houston, MD;  [facility];  Service: Endoscopy;  Laterality: N/A;  1030   COLONOSCOPY WITH PROPOFOL N/A 03/14/2021   Procedure: COLONOSCOPY WITH PROPOFOL;  Surgeon: Harvel Quale, MD;  [facility];  Service: Gastroenterology;  Laterality: N/A;  10:50   POLYPECTOMY  03/14/2021   Procedure: POLYPECTOMY;  Surgeon: Montez Morita, Quillian Quince, MD;  [facility];  Service: Gastroenterology;;    Allergies  No Known Allergies  History of Present Illness    76 year old male with the above past medical history including paroxysmal atrial fibrillation on Eliquis (CHA2DS2VASc =2) bradycardia, hypertension, hyperlipidemia, OSA on CPAP, and morbid obesity.  He was initially diagnosed with atrial fibrillation during an episode of urosepsis in 2017.  He is on Eliquis for chronic anticoagulation.  Echocardiogram in 2017 showed EF 55 to 60%, moderately reduced RV systolic function with moderately dilated RV cavity.  Lexiscan Myoview in August 2017 showed no evidence of ischemia.  He has a history of junctional bradycardia, though he has not required a pacemaker to date.  Follows with Dr. Alvester Chou for OSA.  He last saw his primary cardiologist, Dr.  Sallyanne Kuster in the office in July 2022 and was stable overall from a cardiac standpoint. He did report occasional episodes of dyspnea at rest, likely in the setting of paroxysmal atrial fibrillation, overall, he was minimally symptomatic.  He was evaluated in the ED in December 2022 with complaints of fever, cough, and fatigue in the setting of COVID-19 infection.  He presents today follow-up.  Since his last visit he has been stable from a cardiac standpoint.  He does have occasional palpitations, however, they do not last long, no associated symptoms.  He has baseline stable mild dyspnea, this is unchanged, and he attributes this mostly to physical deconditioning. He thinks he is due for follow-up with Dr. Claiborne Billings for his sleep apnea and is asking about an updated CPAP machine. He stays busy and watches his 30-year-old grandchild most days of the week. Overall, he reports feeling well, and denies any specific concerns or complaints today.  Home Medications    Current Outpatient Medications  Medication Sig Dispense Refill   acetaminophen (TYLENOL) 650 MG CR tablet Take 650 mg by mouth every 8 (eight) hours as needed for pain.     apixaban (ELIQUIS) 5 MG TABS tablet Take 1 tablet (5 mg total) by mouth 2 (two) times daily. 180 tablet 3   buPROPion (WELLBUTRIN SR) 200 MG 12 hr tablet Take 200 mg by mouth daily as needed (Depression).     diclofenac Sodium (VOLTAREN) 1 % GEL Apply 2 g topically as needed (pain).     hydrochlorothiazide (MICROZIDE) 12.5 MG capsule Take 1 capsule (12.5 mg  total) by mouth daily. 90 capsule 3   hydrOXYzine (ATARAX/VISTARIL) 25 MG tablet Take 25-50 mg by mouth daily as needed (Night sweats).     latanoprost (XALATAN) 0.005 % ophthalmic solution 1 drop at bedtime.     lisinopril (ZESTRIL) 40 MG tablet Take 40 mg by mouth daily.     promethazine-dextromethorphan (PROMETHAZINE-DM) 6.25-15 MG/5ML syrup Take 5 mLs by mouth 4 (four) times daily as needed. 100 mL 0   QUEtiapine  (SEROQUEL) 200 MG tablet Take 200 mg by mouth daily as needed (anxiety).     rosuvastatin (CRESTOR) 40 MG tablet Take 40 mg by mouth daily.     traZODone (DESYREL) 100 MG tablet Take 50 mg by mouth at bedtime as needed for sleep.     verapamil (VERELAN PM) 120 MG 24 hr capsule Take 120 mg by mouth at bedtime.      No current facility-administered medications for this visit.     Review of Systems   He denies chest pain, dyspnea, pnd, orthopnea, n, v, dizziness, syncope, edema, weight gain, or early satiety. All other systems reviewed and are otherwise negative except as noted above.    Physical Exam    VS:  BP 124/88    Pulse 75    Ht 6\' 4"  (1.93 m)    Wt (!) 365 lb 9.6 oz (165.8 kg)    SpO2 98%    BMI 44.50 kg/m  GEN: Well nourished, well developed, in no acute distress. HEENT: normal. Neck: Supple, no JVD, carotid bruits, or masses. Cardiac: RRR, no murmurs, rubs, or gallops. No clubbing, cyanosis, edema.  Radials/DP/PT 2+ and equal bilaterally.  Respiratory:  Respirations regular and unlabored, clear to auscultation bilaterally. GI: Soft, nontender, nondistended, BS + x 4. MS: no deformity or atrophy. Skin: warm and dry, no rash. Neuro:  Strength and sensation are intact. Psych: Normal affect.  Accessory Clinical Findings    ECG personally reviewed by me today - No EKG in office today.  Lab Results  Component Value Date   WBC 5.7 02/26/2016   HGB 11.5 (L) 02/26/2016   HCT 37.8 (L) 02/26/2016   MCV 75.9 (L) 02/26/2016   PLT 185 02/26/2016   Lab Results  Component Value Date   CREATININE 1.03 02/02/2020   BUN 9 02/02/2020   NA 141 02/02/2020   K 4.6 02/02/2020   CL 103 02/02/2020   CO2 22 02/02/2020   Lab Results  Component Value Date   ALT 26 02/26/2016   AST 38 02/26/2016   ALKPHOS 60 02/26/2016   BILITOT 0.7 02/26/2016   No results found for: CHOL, HDL, LDLCALC, LDLDIRECT, TRIG, CHOLHDL  No results found for: HGBA1C  Assessment & Plan   1. Paroxysmal  atrial fibrillation on chronic anticoagulation: Diagnosed in 2017. Occasional palpitations, with no associated symptoms, stable.  No EKG in office today, RRR upon auscultation. Denies bleeding on Eliquis. No beta-blocker in the setting of bradycardia. Continue verapamil, Eliquis.     2. Bradycardia: HR 75 bpm in office today, not on beta-blocker, stable.   3. Hypertension: BP well controlled overall, DBP slighlty elevated in office today, 124/88. He will continue to monitor home BP and report BP consistently > 130/80. For now, continue current antihypertensive regimen, if BP persistently elevated could consider increasing HCTZ.    4. Hyperlipidemia: LDL was 98 in 07/2021. Monitored and managed by PCP. Continue statin.   5. OSA: Adherent to CPAP. He is due for follow-up with Dr. Claiborne Billings in sleep clinic. Will  have him schedule appointment with Dr. Claiborne Billings.    6. Disposition: Follow-up in 6 months.   Lenna Sciara, NP 09/24/2021, 11:30 AM

## 2021-09-24 ENCOUNTER — Other Ambulatory Visit: Payer: Self-pay

## 2021-09-24 ENCOUNTER — Ambulatory Visit (INDEPENDENT_AMBULATORY_CARE_PROVIDER_SITE_OTHER): Payer: Medicare Other | Admitting: Nurse Practitioner

## 2021-09-24 ENCOUNTER — Encounter: Payer: Self-pay | Admitting: Physician Assistant

## 2021-09-24 VITALS — BP 124/88 | HR 75 | Ht 76.0 in | Wt 365.6 lb

## 2021-09-24 DIAGNOSIS — Z7901 Long term (current) use of anticoagulants: Secondary | ICD-10-CM | POA: Diagnosis not present

## 2021-09-24 DIAGNOSIS — I480 Paroxysmal atrial fibrillation: Secondary | ICD-10-CM

## 2021-09-24 DIAGNOSIS — G4733 Obstructive sleep apnea (adult) (pediatric): Secondary | ICD-10-CM | POA: Diagnosis not present

## 2021-09-24 DIAGNOSIS — I10 Essential (primary) hypertension: Secondary | ICD-10-CM

## 2021-09-24 DIAGNOSIS — E782 Mixed hyperlipidemia: Secondary | ICD-10-CM | POA: Diagnosis not present

## 2021-09-24 DIAGNOSIS — R001 Bradycardia, unspecified: Secondary | ICD-10-CM

## 2021-09-24 NOTE — Patient Instructions (Signed)
Medication Instructions:  Your physician recommends that you continue on your current medications as directed. Please refer to the Current Medication list given to you today.  *If you need a refill on your cardiac medications before your next appointment, please call your pharmacy*  Lab Work: NONE ordered at this time of appointment   If you have labs (blood work) drawn today and your tests are completely normal, you will receive your results only by: [place] (if you have MyChart) OR A paper copy in the mail If you have any lab test that is abnormal or we need to change your treatment, we will call you to review the results.  Testing/Procedures: NONE ordered at this time of appointment   Follow-Up: At [facility], you and your health needs are our priority.  As part of our continuing mission to provide you with exceptional heart care, we have created designated Provider Care Teams.  These Care Teams include your primary Cardiologist (physician) and Advanced Practice Providers (APPs -  Physician Assistants and Nurse Practitioners) who all work together to provide you with the care you need, when you need it.  We recommend signing up for the patient portal called "MyChart".  Sign up information is provided on this After Visit Summary.  MyChart is used to connect with patients for Virtual Visits (Telemedicine).  Patients are able to view lab/test results, encounter notes, upcoming appointments, etc.  Non-urgent messages can be sent to your provider as well.   To learn more about what you can do with MyChart, go to [email or web address].    Your next appointment:   6 month(s) 1st available Sleep Clinic   The format for your next appointment:   In Person In Person   Provider:   Sanda Klein, MD   Shelva Majestic, MD  Other Instructions

## 2021-10-06 DIAGNOSIS — M1711 Unilateral primary osteoarthritis, right knee: Secondary | ICD-10-CM | POA: Diagnosis not present

## 2021-10-06 DIAGNOSIS — M25562 Pain in left knee: Secondary | ICD-10-CM | POA: Diagnosis not present

## 2021-10-06 DIAGNOSIS — I4891 Unspecified atrial fibrillation: Secondary | ICD-10-CM | POA: Diagnosis not present

## 2021-10-08 DIAGNOSIS — M25562 Pain in left knee: Secondary | ICD-10-CM | POA: Diagnosis not present

## 2021-10-08 DIAGNOSIS — M170 Bilateral primary osteoarthritis of knee: Secondary | ICD-10-CM | POA: Diagnosis not present

## 2021-10-08 DIAGNOSIS — M25561 Pain in right knee: Secondary | ICD-10-CM | POA: Diagnosis not present

## 2021-10-08 DIAGNOSIS — M1712 Unilateral primary osteoarthritis, left knee: Secondary | ICD-10-CM | POA: Diagnosis not present

## 2021-10-09 DIAGNOSIS — M25561 Pain in right knee: Secondary | ICD-10-CM | POA: Diagnosis not present

## 2021-10-09 DIAGNOSIS — M1711 Unilateral primary osteoarthritis, right knee: Secondary | ICD-10-CM | POA: Diagnosis not present

## 2021-10-15 DIAGNOSIS — M1712 Unilateral primary osteoarthritis, left knee: Secondary | ICD-10-CM | POA: Diagnosis not present

## 2021-10-15 DIAGNOSIS — M25562 Pain in left knee: Secondary | ICD-10-CM | POA: Diagnosis not present

## 2021-10-16 DIAGNOSIS — M1711 Unilateral primary osteoarthritis, right knee: Secondary | ICD-10-CM | POA: Diagnosis not present

## 2021-10-16 DIAGNOSIS — M25561 Pain in right knee: Secondary | ICD-10-CM | POA: Diagnosis not present

## 2021-10-17 DIAGNOSIS — I10 Essential (primary) hypertension: Secondary | ICD-10-CM | POA: Diagnosis not present

## 2021-10-17 DIAGNOSIS — I4891 Unspecified atrial fibrillation: Secondary | ICD-10-CM | POA: Diagnosis not present

## 2021-10-17 DIAGNOSIS — R030 Elevated blood-pressure reading, without diagnosis of hypertension: Secondary | ICD-10-CM | POA: Diagnosis not present

## 2021-10-17 DIAGNOSIS — I429 Cardiomyopathy, unspecified: Secondary | ICD-10-CM | POA: Diagnosis not present

## 2021-10-17 DIAGNOSIS — Z299 Encounter for prophylactic measures, unspecified: Secondary | ICD-10-CM | POA: Diagnosis not present

## 2021-10-17 DIAGNOSIS — I77810 Thoracic aortic ectasia: Secondary | ICD-10-CM | POA: Diagnosis not present

## 2021-10-21 DIAGNOSIS — I10 Essential (primary) hypertension: Secondary | ICD-10-CM | POA: Diagnosis not present

## 2021-10-29 ENCOUNTER — Ambulatory Visit (INDEPENDENT_AMBULATORY_CARE_PROVIDER_SITE_OTHER): Payer: Medicare Other | Admitting: Cardiovascular Disease

## 2021-10-29 ENCOUNTER — Other Ambulatory Visit: Payer: Self-pay

## 2021-10-29 ENCOUNTER — Encounter: Payer: Self-pay | Admitting: Cardiovascular Disease

## 2021-10-29 DIAGNOSIS — I10 Essential (primary) hypertension: Secondary | ICD-10-CM | POA: Diagnosis not present

## 2021-10-29 DIAGNOSIS — Z7901 Long term (current) use of anticoagulants: Secondary | ICD-10-CM | POA: Diagnosis not present

## 2021-10-29 DIAGNOSIS — I480 Paroxysmal atrial fibrillation: Secondary | ICD-10-CM | POA: Diagnosis not present

## 2021-10-29 DIAGNOSIS — G4733 Obstructive sleep apnea (adult) (pediatric): Secondary | ICD-10-CM

## 2021-10-29 MED ORDER — VERAPAMIL HCL ER 180 MG PO CP24: 180.0000 mg | ORAL_CAPSULE | Freq: Every day | ORAL | 6 refills | Status: DC

## 2021-10-29 NOTE — Patient Instructions (Signed)
Medication Instructions:  ?NONE ? ?*If you need a refill on your cardiac medications before your next appointment, please call your pharmacy* ? ?Lab Work:   Testing/Procedures:  ?NONE    NONE ? ?Follow-Up: ?Your next appointment:  6 month(s) In Person with Sanda Klein, MD    ? ?Please call our office 2 months in advance to schedule this appointment  ? ?At [facility], you and your health needs are our priority.  As part of our continuing mission to provide you with exceptional heart care, we have created designated Provider Care Teams.  These Care Teams include your primary Cardiologist (physician) and Advanced Practice Providers (APPs -  Physician Assistants and Nurse Practitioners) who all work together to provide you with the care you need, when you need it. ? ?We recommend signing up for the patient portal called "MyChart".  Sign up information is provided on this After Visit Summary.  MyChart is used to connect with patients for Virtual Visits (Telemedicine).  Patients are able to view lab/test results, encounter notes, upcoming appointments, etc.  Non-urgent messages can be sent to your provider as well.   ?To learn more about what you can do with MyChart, go to [email or web address].   ? ? ?

## 2021-10-29 NOTE — Progress Notes (Signed)
Cardiology Office Note    Date:  10/31/2021   ID:  Robert Warren, DOB 14-Mar-1946, MRN [id number]  PCP:  Glenda Chroman, MD  Cardiologist:  Shelva Majestic, MD (sleep); Dr. Sallyanne Kuster  6 month F/U sleep evaluation with me.  History of Present Illness:  Robert Warren is a 76 y.o. male who is followed by Dr. Sallyanne Kuster for cardiology care.  He is a [place] [employer] veteran and has a history of atrial fibrillation.  A prior echo Doppler study had shown evidence for cor pulmonale but he had normal left ventricular systolic function.  He has a history of obstructive sleep apnea and has been on CPAP therapy since  2017 His DME company is [employer].  His initial evaluation was a home study with [facility] on March 20, 2016.  Reportedly on that study his apnea index was 58.0, RDI (AHI: RDI) was 74/h.  Central apnea index was 46.8/h.  He has a ResMed air sense 10 CPAP unit with a set up date on May 05, 2016.  When the patient was recently seen by Dr. Sallyanne Kuster on March 05, 2021, he admitted to poor sleep, nonrestorative sleep, and the need to take naps during the day.  I saw him for my initial evaluation with me on April 23, 2021.  Robert Warren admits that he uses CPAP every night.  He typically goes to bed around 8 PM but does not go to sleep and usually watches television or reads.  He typically falls asleep around 1030 to 11 PM.  He does not put his CPAP mask until he is about to fall asleep.  He wakes up at 4 AM somewhat restless and usually for good at 5:45 AM.  He brought his machine with him to the office today.  Last night he had used his CPAP for 5 hours and 51 minutes and his AHI was 2.2.  We were subsequently able to get a download and over the past month he has used a 29 of 30 nights and 16 out of 30 nights had usage greater than 4 hours.  His average use was 4.6 hours.  He cannot sleep on his back.  His pressure averages 13.5 cm of water.  AHI was 6.1.  Central apnea index 3.0/h.  An Epworth Sleepiness  Scale score was calculated in the office today and this endorsed at 8. He has a history of morbid obesity.  Due to a history of PAF he was on anticoagulation with apixaban.  With a history of hypertension he was on lisinopril 40 mg, verapamil 120 mg and was on rosuvastatin 40 mg for hyperlipidemia.  During his initial evaluation, blood pressure was elevated and HCTZ 12.5 mg was added to his medical regimen.  During his initial evaluation with me, I made adjustments to his CPAP unit which had been set at a pressure range previously of 5 to 20 cm.  With his 95th percentile pressure 13.5 with maximum average pressure 14.5 I changed his minimum pressure to 10 cm and continued 20 cm maximal pressure.  I also increased his ramp start pressure.  I discussed the importance of weight loss and exercise both for his cardiovascular health and effects on his severe sleep apnea.  Presently, Robert Warren feels well.  I obtained a new download from February 5 through October 27, 2021 at his 10 to 20 cm setting range.  He is compliant with usage at 100% and average use at 6 hours and 26 minutes.  He typically  goes to bed and watches television at 9 AM but it may be several hours before he puts the mask on.  AHI is 4.8.  On most days there is no mask leak and there were several days with mild mask leak.  [employer] is his DME company.  Since I saw him, his dose of the verapamil has been increased from 120 mg to 180 mg at the [place] 1 month ago.  He continues to be on lisinopril 40 mg and HCTZ 12.5 mg.  He continues to be on Eliquis for anticoagulation and is unaware of any recurrent atrial fibrillation.  He is on rosuvastatin 40 mg for hyperlipidemia.  He presents for follow-up evaluation.   Past Medical History:  Diagnosis Date   Anxiety    Atrial fibrillation ([place])    Hypertension    Shortness of breath    Sleep apnea     Past Surgical History:  Procedure Laterality Date   Anal cyst Removal     COLONOSCOPY N/A 06/20/2014    Procedure: COLONOSCOPY;  Surgeon: Rogene Houston, MD;  [facility];  Service: Endoscopy;  Laterality: N/A;  1030   COLONOSCOPY WITH PROPOFOL N/A 03/14/2021   Procedure: COLONOSCOPY WITH PROPOFOL;  Surgeon: Harvel Quale, MD;  Location: [facility];  Service: Gastroenterology;  Laterality: N/A;  10:50   POLYPECTOMY  03/14/2021   Procedure: POLYPECTOMY;  Surgeon: Montez Morita, Quillian Quince, MD;  Location: [facility];  Service: Gastroenterology;;    Current Medications: Outpatient Medications Prior to Visit  Medication Sig Dispense Refill   acetaminophen (TYLENOL) 650 MG CR tablet Take 650 mg by mouth every 8 (eight) hours as needed for pain.     apixaban (ELIQUIS) 5 MG TABS tablet Take 1 tablet (5 mg total) by mouth 2 (two) times daily. 180 tablet 3   buPROPion (WELLBUTRIN SR) 200 MG 12 hr tablet Take 200 mg by mouth daily as needed (Depression).     diclofenac Sodium (VOLTAREN) 1 % GEL Apply 2 g topically as needed (pain).     hydrochlorothiazide (MICROZIDE) 12.5 MG capsule Take 1 capsule (12.5 mg total) by mouth daily. 90 capsule 3   hydrOXYzine (ATARAX/VISTARIL) 25 MG tablet Take 25-50 mg by mouth daily as needed (Night sweats).     latanoprost (XALATAN) 0.005 % ophthalmic solution 1 drop at bedtime.     lisinopril (ZESTRIL) 40 MG tablet Take 40 mg by mouth daily.     promethazine-dextromethorphan (PROMETHAZINE-DM) 6.25-15 MG/5ML syrup Take 5 mLs by mouth 4 (four) times daily as needed. 100 mL 0   QUEtiapine (SEROQUEL) 200 MG tablet Take 200 mg by mouth daily as needed (anxiety).     rosuvastatin (CRESTOR) 40 MG tablet Take 40 mg by mouth daily.     traZODone (DESYREL) 100 MG tablet Take 50 mg by mouth at bedtime as needed for sleep.     verapamil (VERELAN PM) 120 MG 24 hr capsule Take 120 mg by mouth at bedtime.      No facility-administered medications prior to visit.     Allergies:   Patient has no known allergies.   Social History   Socioeconomic  History   Marital status: Married    Spouse name: Not on file   Number of children: Not on file   Years of education: Not on file   Highest education level: Not on file  Occupational History   Not on file  Tobacco Use   Smoking status: Former    Packs/day: 1.00  Years: 30.00    Pack years: 30.00    Types: Cigarettes    Quit date: 08/23/1996    Years since quitting: 25.2   Smokeless tobacco: Never  Vaping Use   Vaping Use: Never used  Substance and Sexual Activity   Alcohol use: Yes    Comment: occasional   Drug use: No   Sexual activity: Yes  Other Topics Concern   Not on file  Social History Narrative   Not on file   Social Determinants of Health   Financial Resource Strain: Not on file  Food Insecurity: Not on file  Transportation Needs: Not on file  Physical Activity: Not on file  Stress: Not on file  Social Connections: Not on file    Additional social history is notable that he is married for 48 years.  He has 2 children and 6 grandchildren.  He previously had worked as a Games developer at [employer].  He smoked from age 62 and quit smoking in 1999.  Family History:  The patient's family history includes Cancer - Lung in his father; Heart attack in his father; Heart disease in his mother.  His mother died at age 21 with heart disease and father died at age 68 with cancer.  ROS General: Negative; No fevers, chills, or night sweats; morbid obesity HEENT: Negative; No changes in vision or hearing, sinus congestion, difficulty swallowing Pulmonary: Negative; No cough, wheezing, shortness of breath, hemoptysis Cardiovascular: Positive for hypertension, PAF GI: Negative; No nausea, vomiting, diarrhea, or abdominal pain GU: Negative; No dysuria, hematuria, or difficulty voiding Musculoskeletal: Negative; no myalgias, joint pain, or weakness Hematologic/Oncology: Negative; no easy bruising, bleeding Endocrine: Negative; no heat/cold intolerance; no  diabetes Neuro: Negative; no changes in balance, headaches Skin: Negative; No rashes or skin lesions Psychiatric: Negative; No behavioral problems, depression Sleep: Positive for OSA, on therapy since September 2017.  Admits to fatigability, daytime sleepiness, cannot sleep on his back; negative; No bruxism, restless legs, hypnogognic hallucinations, no cataplexy Other comprehensive 14 point system review is negative.   PHYSICAL EXAM:   VS:  BP 128/73    Pulse (!) 52    Ht _0  (1.93 m)    Wt (!) 370 lb 3.2 oz (167.9 kg)    SpO2 99%    BMI 45.06 kg/m     Repeat blood pressure by me was 132/78  Wt Readings from Last 3 Encounters:  10/29/21 (!) 370 lb 3.2 oz (167.9 kg)  09/24/21 (!) 365 lb 9.6 oz (165.8 kg)  04/23/21 (!) 370 lb (167.8 kg)    General: Alert, oriented, no distress.  Skin: normal turgor, no rashes, warm and dry HEENT: Normocephalic, atraumatic. Pupils equal round and reactive to light; sclera anicteric; extraocular muscles intact;  Nose without nasal septal hypertrophy Mouth/Parynx benign; Mallinpatti scale 3/4 Neck: No JVD, no carotid bruits; normal carotid upstroke Lungs: clear to ausculatation and percussion; no wheezing or rales Chest wall: without tenderness to palpitation Heart: PMI not displaced, RRR, s1 s2 normal, 1/6 systolic murmur, no diastolic murmur, no rubs, gallops, thrills, or heaves Abdomen: soft, nontender; no hepatosplenomehaly, BS+; abdominal aorta nontender and not dilated by palpation. Back: no CVA tenderness Pulses 2+ Musculoskeletal: full range of motion, normal strength, no joint deformities Extremities: no clubbing cyanosis or edema, Homan's sign negative  Neurologic: grossly nonfocal; Cranial nerves grossly wnl Psychologic: Normal mood and affect    Studies/Labs Reviewed:   October 29, 2021 ECG (independently read by me):Sinus bradycardia at 51 with sinus arrythmia; 1st  degree AV block, PR 212 msec  April 23, 2021 ECG (independently read  by me): Sinus bradycardia at 58, 1st degree AV block  Recent Labs: BMP Latest Ref Rng & Units 02/02/2020 02/26/2016 02/25/2016  Glucose 65 - 99 mg/dL 87 96 123(H)  BUN 8 - 27 mg/dL 9 17 21(H)  Creatinine 0.76 - 1.27 mg/dL 1.03 1.35(H) 1.53(H)  BUN/Creat Ratio 10 - 24 9(L) - -  Sodium 134 - 144 mmol/L 141 140 137  Potassium 3.5 - 5.2 mmol/L 4.6 3.8 3.4(L)  Chloride 96 - 106 mmol/L 103 107 106  CO2 20 - 29 mmol/L '22 27 24  '$ Calcium 8.6 - 10.2 mg/dL 9.6 8.4(L) 8.7(L)     Hepatic Function Latest Ref Rng & Units 02/26/2016 02/25/2016  Total Protein 6.5 - 8.1 g/dL 6.1(L) 7.2  Albumin 3.5 - 5.0 g/dL 2.5(L) 3.0(L)  AST 15 - 41 U/L 38 18  ALT 17 - 63 U/L 26 15(L)  Alk Phosphatase 38 - 126 U/L 60 68  Total Bilirubin 0.3 - 1.2 mg/dL 0.7 1.5(H)  Bilirubin, Direct 0.1 - 0.5 mg/dL - 0.4    CBC Latest Ref Rng & Units 02/26/2016 02/25/2016 02/24/2016  WBC 4.0 - 10.5 K/uL 5.7 12.7(H) 16.2(H)  Hemoglobin 13.0 - 17.0 g/dL 11.5(L) 12.8(L) 12.6(L)  Hematocrit 39.0 - 52.0 % 37.8(L) 41.2 40.0  Platelets 150 - 400 K/uL 185 200 207   Lab Results  Component Value Date   MCV 75.9 (L) 02/26/2016   MCV 75.7 (L) 02/25/2016   MCV 74.8 (L) 02/24/2016   Lab Results  Component Value Date   TSH 1.968 02/25/2016   No results found for: HGBA1C   BNP    Component Value Date/Time   BNP 173.7 (H) 02/24/2016 1950    ProBNP No results found for: PROBNP   Lipid Panel  No results found for: CHOL, TRIG, HDL, CHOLHDL, VLDL, LDLCALC, LDLDIRECT, LABVLDL   RADIOLOGY: No results found.   Additional studies/ records that were reviewed today include:  I was able to obtain the patient's prior sleep evaluation which was a home study with [facility] on March 20, 2016 as noted above.  I reviewed the records of Dr. Sallyanne Kuster.  I obtained a new download from his CPAP machine which he brought with him today.  His CPAP machine is from 2017, although the download stated August 24, 2020 which is incorrect  ASSESSMENT:     1. OSA (obstructive sleep apnea)   2. Essential hypertension   3. Paroxysmal atrial fibrillation (HCC)   4. Anticoagulated   5. Morbid obesity [facility])      PLAN:  Robert Warren is a 76 year old African-American gentleman is a [place] [employer] veteran and has a history of atrial fibrillation with rapid ventricular response which was initially diagnosed in 2017 during an episode of urosepsis.  At that time, an echo Doppler study showed normal systolic function but there was evidence for cor pulmonale.  Nuclear stress test in 2017 did not show any evidence of ischemia.  He has a history of hyperlipidemia for which he has been on rosuvastatin.  He continues to be on anticoagulation with apixaban.  He was diagnosed with severe sleep apnea on initial home study in 2017 and apparently was set up by [employer] for CPAP therapy on May 05, 2016.  He has been on therapy ever since.  Presently, he has been going to bed at 8 PM but typically does not attempt to sleep until 1030 or 11.  He usually  watches television in bed or reads but at times he does doze off during this.  When I initially saw him, I discussed optimal sleep hygiene and recommended that he use bed predominantly for sleep and not to wait several hours before putting his mask on.  In addition, previously he was taking his mask off around 4 AM going to the bathroom and may not put it back on for several hours.  I discussed with him optimal sleep duration at 7 and 9 hours and that he should try using CPAP for ideally 8 hours duration if at all possible.  I discussed the benefit with reference to blood pressure control.  He is now on an increased regimen consisting of lisinopril 40 mg, verapamil 180 mg in addition to his HCTZ 12.5 mg.  When I saw him for my initial evaluation I discussed in detail potential adverse consequences of sleep apnea regarding his cardiovascular health.  His current settings have significantly improved his AHI.  However with his  AHI at 4.8 I will make slight adjustment and changes pressure range to commence at 12 cm of water pressure up to 20 cm.  Again discussed the importance of weight loss and exercise.  He will be seeing Dr. Sallyanne Kuster in 6 months.  I will schedule him for a 1 year follow-up evaluation with me I will see him sooner as needed.   Medication Adjustments/Labs and Tests Ordered: Current medicines are reviewed at length with the patient today.  Concerns regarding medicines are outlined above.  Medication changes, Labs and Tests ordered today are listed in the Patient Instructions below. Patient Instructions  Medication Instructions:  NONE  *If you need a refill on your cardiac medications before your next appointment, please call your pharmacy*  Lab Work:   Testing/Procedures:  NONE    NONE  Follow-Up: Your next appointment:  6 month(s) In Person with Sanda Klein, MD     Please call our office 2 months in advance to schedule this appointment   At [facility], you and your health needs are our priority.  As part of our continuing mission to provide you with exceptional heart care, we have created designated Provider Care Teams.  These Care Teams include your primary Cardiologist (physician) and Advanced Practice Providers (APPs -  Physician Assistants and Nurse Practitioners) who all work together to provide you with the care you need, when you need it.  We recommend signing up for the patient portal called "MyChart".  Sign up information is provided on this After Visit Summary.  MyChart is used to connect with patients for Virtual Visits (Telemedicine).  Patients are able to view lab/test results, encounter notes, upcoming appointments, etc.  Non-urgent messages can be sent to your provider as well.   To learn more about what you can do with MyChart, go to [email or web address].       Signed, Shelva Majestic, MD  10/31/2021 12:43 PM    [place] [address]  [place], [place], [place]  [address] Phone: [phone]

## 2021-10-31 ENCOUNTER — Encounter: Payer: Self-pay | Admitting: Cardiovascular Disease

## 2021-11-20 DIAGNOSIS — I10 Essential (primary) hypertension: Secondary | ICD-10-CM | POA: Diagnosis not present

## 2021-11-28 DIAGNOSIS — M25561 Pain in right knee: Secondary | ICD-10-CM | POA: Diagnosis not present

## 2021-11-28 DIAGNOSIS — M170 Bilateral primary osteoarthritis of knee: Secondary | ICD-10-CM | POA: Diagnosis not present

## 2021-12-21 DIAGNOSIS — I10 Essential (primary) hypertension: Secondary | ICD-10-CM | POA: Diagnosis not present

## 2021-12-29 DIAGNOSIS — M1711 Unilateral primary osteoarthritis, right knee: Secondary | ICD-10-CM | POA: Diagnosis not present

## 2021-12-29 DIAGNOSIS — M25361 Other instability, right knee: Secondary | ICD-10-CM | POA: Diagnosis not present

## 2021-12-29 DIAGNOSIS — I4891 Unspecified atrial fibrillation: Secondary | ICD-10-CM | POA: Diagnosis not present

## 2022-01-14 DIAGNOSIS — M25562 Pain in left knee: Secondary | ICD-10-CM | POA: Diagnosis not present

## 2022-01-14 DIAGNOSIS — M1712 Unilateral primary osteoarthritis, left knee: Secondary | ICD-10-CM | POA: Diagnosis not present

## 2022-01-14 DIAGNOSIS — M25561 Pain in right knee: Secondary | ICD-10-CM | POA: Diagnosis not present

## 2022-01-14 DIAGNOSIS — M170 Bilateral primary osteoarthritis of knee: Secondary | ICD-10-CM | POA: Diagnosis not present

## 2022-01-20 DIAGNOSIS — I10 Essential (primary) hypertension: Secondary | ICD-10-CM | POA: Diagnosis not present

## 2022-01-21 DIAGNOSIS — M1711 Unilateral primary osteoarthritis, right knee: Secondary | ICD-10-CM | POA: Diagnosis not present

## 2022-01-21 DIAGNOSIS — M25561 Pain in right knee: Secondary | ICD-10-CM | POA: Diagnosis not present

## 2022-01-28 DIAGNOSIS — M1712 Unilateral primary osteoarthritis, left knee: Secondary | ICD-10-CM | POA: Diagnosis not present

## 2022-01-28 DIAGNOSIS — M25562 Pain in left knee: Secondary | ICD-10-CM | POA: Diagnosis not present

## 2022-02-04 DIAGNOSIS — M1711 Unilateral primary osteoarthritis, right knee: Secondary | ICD-10-CM | POA: Diagnosis not present

## 2022-02-04 DIAGNOSIS — M25561 Pain in right knee: Secondary | ICD-10-CM | POA: Diagnosis not present

## 2022-02-11 DIAGNOSIS — M1712 Unilateral primary osteoarthritis, left knee: Secondary | ICD-10-CM | POA: Diagnosis not present

## 2022-02-11 DIAGNOSIS — M25562 Pain in left knee: Secondary | ICD-10-CM | POA: Diagnosis not present

## 2022-02-12 DIAGNOSIS — M1711 Unilateral primary osteoarthritis, right knee: Secondary | ICD-10-CM | POA: Diagnosis not present

## 2022-02-12 DIAGNOSIS — M25561 Pain in right knee: Secondary | ICD-10-CM | POA: Diagnosis not present

## 2022-02-16 DIAGNOSIS — H40022 Open angle with borderline findings, high risk, left eye: Secondary | ICD-10-CM | POA: Diagnosis not present

## 2022-02-16 DIAGNOSIS — H2513 Age-related nuclear cataract, bilateral: Secondary | ICD-10-CM | POA: Diagnosis not present

## 2022-02-16 DIAGNOSIS — H2511 Age-related nuclear cataract, right eye: Secondary | ICD-10-CM | POA: Diagnosis not present

## 2022-02-16 DIAGNOSIS — H25013 Cortical age-related cataract, bilateral: Secondary | ICD-10-CM | POA: Diagnosis not present

## 2022-02-16 DIAGNOSIS — H401111 Primary open-angle glaucoma, right eye, mild stage: Secondary | ICD-10-CM | POA: Diagnosis not present

## 2022-02-16 DIAGNOSIS — H52213 Irregular astigmatism, bilateral: Secondary | ICD-10-CM | POA: Diagnosis not present

## 2022-02-18 DIAGNOSIS — M25562 Pain in left knee: Secondary | ICD-10-CM | POA: Diagnosis not present

## 2022-02-18 DIAGNOSIS — M1712 Unilateral primary osteoarthritis, left knee: Secondary | ICD-10-CM | POA: Diagnosis not present

## 2022-02-19 DIAGNOSIS — M1711 Unilateral primary osteoarthritis, right knee: Secondary | ICD-10-CM | POA: Diagnosis not present

## 2022-02-19 DIAGNOSIS — I10 Essential (primary) hypertension: Secondary | ICD-10-CM | POA: Diagnosis not present

## 2022-02-19 DIAGNOSIS — M25561 Pain in right knee: Secondary | ICD-10-CM | POA: Diagnosis not present

## 2022-02-20 DIAGNOSIS — I10 Essential (primary) hypertension: Secondary | ICD-10-CM | POA: Diagnosis not present

## 2022-02-20 DIAGNOSIS — E7800 Pure hypercholesterolemia, unspecified: Secondary | ICD-10-CM | POA: Diagnosis not present

## 2022-02-23 DIAGNOSIS — M25461 Effusion, right knee: Secondary | ICD-10-CM | POA: Diagnosis not present

## 2022-02-23 DIAGNOSIS — M170 Bilateral primary osteoarthritis of knee: Secondary | ICD-10-CM | POA: Diagnosis not present

## 2022-02-23 DIAGNOSIS — I4891 Unspecified atrial fibrillation: Secondary | ICD-10-CM | POA: Diagnosis not present

## 2022-03-03 DIAGNOSIS — H25811 Combined forms of age-related cataract, right eye: Secondary | ICD-10-CM | POA: Diagnosis not present

## 2022-03-03 DIAGNOSIS — H2511 Age-related nuclear cataract, right eye: Secondary | ICD-10-CM | POA: Diagnosis not present

## 2022-03-16 DIAGNOSIS — I10 Essential (primary) hypertension: Secondary | ICD-10-CM | POA: Diagnosis not present

## 2022-03-16 DIAGNOSIS — Z299 Encounter for prophylactic measures, unspecified: Secondary | ICD-10-CM | POA: Diagnosis not present

## 2022-03-16 DIAGNOSIS — Z6841 Body Mass Index (BMI) 40.0 and over, adult: Secondary | ICD-10-CM | POA: Diagnosis not present

## 2022-03-16 DIAGNOSIS — Z713 Dietary counseling and surveillance: Secondary | ICD-10-CM | POA: Diagnosis not present

## 2022-03-23 DIAGNOSIS — I10 Essential (primary) hypertension: Secondary | ICD-10-CM | POA: Diagnosis not present

## 2022-03-23 DIAGNOSIS — E7800 Pure hypercholesterolemia, unspecified: Secondary | ICD-10-CM | POA: Diagnosis not present

## 2022-03-27 DIAGNOSIS — M1711 Unilateral primary osteoarthritis, right knee: Secondary | ICD-10-CM | POA: Diagnosis not present

## 2022-03-27 DIAGNOSIS — M25361 Other instability, right knee: Secondary | ICD-10-CM | POA: Diagnosis not present

## 2022-03-27 DIAGNOSIS — I4891 Unspecified atrial fibrillation: Secondary | ICD-10-CM | POA: Diagnosis not present

## 2022-04-03 ENCOUNTER — Inpatient Hospital Stay (HOSPITAL_COMMUNITY)
Admission: EM | Admit: 2022-04-03 | Discharge: 2022-04-06 | DRG: 872 | Disposition: A | Discharge status: Home Health | Payer: Medicare Other | Source: Ambulatory Visit | Attending: Internal Medicine | Admitting: Internal Medicine

## 2022-04-03 ENCOUNTER — Emergency Department (HOSPITAL_COMMUNITY): Payer: Medicare Other

## 2022-04-03 ENCOUNTER — Encounter (HOSPITAL_COMMUNITY): Payer: Self-pay | Admitting: Emergency Medicine

## 2022-04-03 ENCOUNTER — Other Ambulatory Visit: Payer: Self-pay

## 2022-04-03 DIAGNOSIS — H1089 Other conjunctivitis: Secondary | ICD-10-CM | POA: Diagnosis not present

## 2022-04-03 DIAGNOSIS — E876 Hypokalemia: Secondary | ICD-10-CM | POA: Diagnosis present

## 2022-04-03 DIAGNOSIS — N12 Tubulo-interstitial nephritis, not specified as acute or chronic: Secondary | ICD-10-CM | POA: Diagnosis present

## 2022-04-03 DIAGNOSIS — Z7901 Long term (current) use of anticoagulants: Secondary | ICD-10-CM | POA: Diagnosis not present

## 2022-04-03 DIAGNOSIS — N419 Inflammatory disease of prostate, unspecified: Secondary | ICD-10-CM | POA: Diagnosis not present

## 2022-04-03 DIAGNOSIS — Z8249 Family history of ischemic heart disease and other diseases of the circulatory system: Secondary | ICD-10-CM | POA: Diagnosis not present

## 2022-04-03 DIAGNOSIS — Z6841 Body Mass Index (BMI) 40.0 and over, adult: Secondary | ICD-10-CM

## 2022-04-03 DIAGNOSIS — A4151 Sepsis due to Escherichia coli [E. coli]: Secondary | ICD-10-CM | POA: Diagnosis present

## 2022-04-03 DIAGNOSIS — N410 Acute prostatitis: Secondary | ICD-10-CM

## 2022-04-03 DIAGNOSIS — N179 Acute kidney failure, unspecified: Secondary | ICD-10-CM | POA: Diagnosis present

## 2022-04-03 DIAGNOSIS — M5440 Lumbago with sciatica, unspecified side: Secondary | ICD-10-CM | POA: Diagnosis not present

## 2022-04-03 DIAGNOSIS — N400 Enlarged prostate without lower urinary tract symptoms: Secondary | ICD-10-CM | POA: Diagnosis present

## 2022-04-03 DIAGNOSIS — E785 Hyperlipidemia, unspecified: Secondary | ICD-10-CM | POA: Diagnosis present

## 2022-04-03 DIAGNOSIS — R531 Weakness: Secondary | ICD-10-CM | POA: Diagnosis not present

## 2022-04-03 DIAGNOSIS — Z87891 Personal history of nicotine dependence: Secondary | ICD-10-CM | POA: Diagnosis not present

## 2022-04-03 DIAGNOSIS — K5730 Diverticulosis of large intestine without perforation or abscess without bleeding: Secondary | ICD-10-CM | POA: Diagnosis not present

## 2022-04-03 DIAGNOSIS — G4733 Obstructive sleep apnea (adult) (pediatric): Secondary | ICD-10-CM | POA: Diagnosis not present

## 2022-04-03 DIAGNOSIS — E782 Mixed hyperlipidemia: Secondary | ICD-10-CM | POA: Diagnosis present

## 2022-04-03 DIAGNOSIS — N281 Cyst of kidney, acquired: Secondary | ICD-10-CM | POA: Diagnosis not present

## 2022-04-03 DIAGNOSIS — N390 Urinary tract infection, site not specified: Secondary | ICD-10-CM | POA: Diagnosis not present

## 2022-04-03 DIAGNOSIS — A419 Sepsis, unspecified organism: Secondary | ICD-10-CM | POA: Diagnosis not present

## 2022-04-03 DIAGNOSIS — G8929 Other chronic pain: Secondary | ICD-10-CM | POA: Diagnosis not present

## 2022-04-03 DIAGNOSIS — I4892 Unspecified atrial flutter: Secondary | ICD-10-CM | POA: Diagnosis present

## 2022-04-03 DIAGNOSIS — I10 Essential (primary) hypertension: Secondary | ICD-10-CM | POA: Diagnosis present

## 2022-04-03 DIAGNOSIS — Z79899 Other long term (current) drug therapy: Secondary | ICD-10-CM | POA: Diagnosis not present

## 2022-04-03 DIAGNOSIS — N200 Calculus of kidney: Secondary | ICD-10-CM | POA: Diagnosis present

## 2022-04-03 DIAGNOSIS — R350 Frequency of micturition: Secondary | ICD-10-CM | POA: Diagnosis not present

## 2022-04-03 DIAGNOSIS — R6520 Severe sepsis without septic shock: Secondary | ICD-10-CM | POA: Diagnosis present

## 2022-04-03 DIAGNOSIS — R6510 Systemic inflammatory response syndrome (SIRS) of non-infectious origin without acute organ dysfunction: Secondary | ICD-10-CM

## 2022-04-03 DIAGNOSIS — N3090 Cystitis, unspecified without hematuria: Secondary | ICD-10-CM | POA: Diagnosis not present

## 2022-04-03 DIAGNOSIS — I4891 Unspecified atrial fibrillation: Secondary | ICD-10-CM | POA: Diagnosis present

## 2022-04-03 DIAGNOSIS — F419 Anxiety disorder, unspecified: Secondary | ICD-10-CM | POA: Diagnosis present

## 2022-04-03 DIAGNOSIS — Z299 Encounter for prophylactic measures, unspecified: Secondary | ICD-10-CM | POA: Diagnosis not present

## 2022-04-03 LAB — LACTIC ACID, PLASMA
Lactic Acid, Venous: 1.6 mmol/L (ref 0.5–1.9)
Lactic Acid, Venous: 1.6 mmol/L (ref 0.5–1.9)

## 2022-04-03 LAB — CBC WITH DIFFERENTIAL/PLATELET
Abs Immature Granulocytes: 0.06 10*3/uL (ref 0.00–0.07)
Basophils Absolute: 0.1 10*3/uL (ref 0.0–0.1)
Basophils Relative: 1 %
Eosinophils Absolute: 0.1 10*3/uL (ref 0.0–0.5)
Eosinophils Relative: 1 %
HCT: 49.1 % (ref 39.0–52.0)
Hemoglobin: 15.9 g/dL (ref 13.0–17.0)
Immature Granulocytes: 1 %
Lymphocytes Relative: 14 %
Lymphs Abs: 1.1 10*3/uL (ref 0.7–4.0)
MCH: 26.3 pg (ref 26.0–34.0)
MCHC: 32.4 g/dL (ref 30.0–36.0)
MCV: 81.2 fL (ref 80.0–100.0)
Monocytes Absolute: 1.1 10*3/uL — ABNORMAL HIGH (ref 0.1–1.0)
Monocytes Relative: 14 %
Neutro Abs: 5.5 10*3/uL (ref 1.7–7.7)
Neutrophils Relative %: 69 %
Platelets: 216 10*3/uL (ref 150–400)
RBC: 6.05 MIL/uL — ABNORMAL HIGH (ref 4.22–5.81)
RDW: 14.2 % (ref 11.5–15.5)
WBC: 7.9 10*3/uL (ref 4.0–10.5)
nRBC: 0 % (ref 0.0–0.2)

## 2022-04-03 LAB — COMPREHENSIVE METABOLIC PANEL
ALT: 62 U/L — ABNORMAL HIGH (ref 0–44)
AST: 108 U/L — ABNORMAL HIGH (ref 15–41)
Albumin: 3.1 g/dL — ABNORMAL LOW (ref 3.5–5.0)
Alkaline Phosphatase: 73 U/L (ref 38–126)
Anion gap: 8 (ref 5–15)
BUN: 21 mg/dL (ref 8–23)
CO2: 22 mmol/L (ref 22–32)
Calcium: 8.4 mg/dL — ABNORMAL LOW (ref 8.9–10.3)
Chloride: 108 mmol/L (ref 98–111)
Creatinine, Ser: 1.74 mg/dL — ABNORMAL HIGH (ref 0.61–1.24)
GFR, Estimated: 41 mL/min — ABNORMAL LOW (ref >60)
Glucose, Bld: 115 mg/dL — ABNORMAL HIGH (ref 70–99)
Potassium: 3.4 mmol/L — ABNORMAL LOW (ref 3.5–5.1)
Sodium: 138 mmol/L (ref 135–145)
Total Bilirubin: 1.3 mg/dL — ABNORMAL HIGH (ref 0.3–1.2)
Total Protein: 7 g/dL (ref 6.5–8.1)

## 2022-04-03 LAB — URINALYSIS, ROUTINE W REFLEX MICROSCOPIC
Bilirubin Urine: NEGATIVE
Glucose, UA: NEGATIVE mg/dL
Ketones, ur: NEGATIVE mg/dL
Nitrite: NEGATIVE
Protein, ur: >=300 mg/dL — AB
RBC / HPF: >50 RBC/hpf — ABNORMAL HIGH (ref 0–5)
Specific Gravity, Urine: 1.025 (ref 1.005–1.030)
WBC, UA: >50 WBC/hpf — ABNORMAL HIGH (ref 0–5)
pH: 5 (ref 5.0–8.0)

## 2022-04-03 MED ORDER — SODIUM CHLORIDE 0.9 % IV SOLN
2.0000 g | Freq: Once | INTRAVENOUS | Status: AC
  Administered 2022-04-03: 2 g via INTRAVENOUS
  Filled 2022-04-03: qty 20

## 2022-04-03 MED ORDER — ACETAMINOPHEN 650 MG RE SUPP: 650.0000 mg | Freq: Four times a day (QID) | RECTAL | Status: DC | PRN

## 2022-04-03 MED ORDER — POTASSIUM CHLORIDE CRYS ER 20 MEQ PO TBCR
40.0000 meq | EXTENDED_RELEASE_TABLET | Freq: Once | ORAL | Status: AC
  Administered 2022-04-03: 40 meq via ORAL
  Filled 2022-04-03: qty 2

## 2022-04-03 MED ORDER — HYDROXYZINE HCL 25 MG PO TABS: 25.0000 mg | ORAL_TABLET | Freq: Every day | ORAL | Status: DC | PRN

## 2022-04-03 MED ORDER — VERAPAMIL HCL ER 180 MG PO TBCR
180.0000 mg | EXTENDED_RELEASE_TABLET | Freq: Every day | ORAL | Status: DC
  Administered 2022-04-03 – 2022-04-05 (×3): 180 mg via ORAL
  Filled 2022-04-03 (×5): qty 1

## 2022-04-03 MED ORDER — BUPROPION HCL ER (SR) 100 MG PO TB12: 200.0000 mg | ORAL_TABLET | Freq: Every day | ORAL | Status: DC | PRN

## 2022-04-03 MED ORDER — APIXABAN 5 MG PO TABS
5.0000 mg | ORAL_TABLET | Freq: Two times a day (BID) | ORAL | Status: DC
  Administered 2022-04-03 – 2022-04-06 (×6): 5 mg via ORAL
  Filled 2022-04-03 (×6): qty 1

## 2022-04-03 MED ORDER — ONDANSETRON HCL 4 MG PO TABS: 4.0000 mg | ORAL_TABLET | Freq: Four times a day (QID) | ORAL | Status: DC | PRN

## 2022-04-03 MED ORDER — QUETIAPINE FUMARATE 100 MG PO TABS: 200.0000 mg | ORAL_TABLET | Freq: Every day | ORAL | Status: DC | PRN

## 2022-04-03 MED ORDER — SODIUM CHLORIDE 0.9 % IV BOLUS
1000.0000 mL | Freq: Once | INTRAVENOUS | Status: AC
  Administered 2022-04-03: 1000 mL via INTRAVENOUS

## 2022-04-03 MED ORDER — ACETAMINOPHEN 325 MG PO TABS: 650.0000 mg | ORAL_TABLET | Freq: Four times a day (QID) | ORAL | Status: DC | PRN

## 2022-04-03 MED ORDER — LACTATED RINGERS IV SOLN: INTRAVENOUS | Status: DC

## 2022-04-03 MED ORDER — TRAZODONE HCL 50 MG PO TABS: 50.0000 mg | ORAL_TABLET | Freq: Every evening | ORAL | Status: DC | PRN

## 2022-04-03 MED ORDER — DILTIAZEM HCL 25 MG/5ML IV SOLN
10.0000 mg | Freq: Once | INTRAVENOUS | Status: AC
  Administered 2022-04-03: 10 mg via INTRAVENOUS
  Filled 2022-04-03: qty 5

## 2022-04-03 MED ORDER — SODIUM CHLORIDE 0.9 % IV SOLN
2.0000 g | INTRAVENOUS | Status: DC
  Administered 2022-04-04 – 2022-04-05 (×2): 2 g via INTRAVENOUS
  Filled 2022-04-03 (×2): qty 20

## 2022-04-03 MED ORDER — LATANOPROST 0.005 % OP SOLN
1.0000 [drp] | Freq: Every day | OPHTHALMIC | Status: DC
  Administered 2022-04-03 – 2022-04-05 (×3): 1 [drp] via OPHTHALMIC
  Filled 2022-04-03 (×2): qty 2.5

## 2022-04-03 MED ORDER — ONDANSETRON HCL 4 MG/2ML IJ SOLN: 4.0000 mg | Freq: Four times a day (QID) | INTRAMUSCULAR | Status: DC | PRN

## 2022-04-03 MED ORDER — HYDROMORPHONE HCL 1 MG/ML IJ SOLN: 0.5000 mg | INTRAMUSCULAR | Status: DC | PRN

## 2022-04-03 NOTE — ED Triage Notes (Signed)
Pt sent by Dr. Woody Seller [facility]) for evaluation of left flank pain with dysuria x 1 week, per pt PMH of kidney stones, last time was around 4-5 years ago.

## 2022-04-03 NOTE — ED Provider Notes (Signed)
[facility] EMERGENCY DEPARTMENT Provider Note   CSN: [id number] Arrival date & time: 04/03/22  1112     History  Chief Complaint  Patient presents with   Flank Pain    Robert Warren is a 76 y.o. male.  He has a history of A-fib on anticoagulation.  He is on a couple weeks of urinary frequency followed by urinary hesitancy.  Some suprapubic abdominal pain.  Some more chronic low back pain that he attributes to his sciatica.  He went to his PCP today who noted him to possibly have a UTI and sent him here to get a CAT scan.  Patient denies any fevers although has been having hot and cold flashes and has been sweating.  Has decreased appetite.  No chest pain or shortness of breath.  No gross hematuria.  The history is provided by the patient.  Flank Pain This is a new problem. The current episode started more than 1 week ago. The problem occurs daily. The problem has not changed since onset.Associated symptoms include abdominal pain. Pertinent negatives include no chest pain, no headaches and no shortness of breath. Nothing aggravates the symptoms. Nothing relieves the symptoms. He has tried rest for the symptoms. The treatment provided no relief.       Home Medications Prior to Admission medications   Medication Sig Start Date End Date Taking? Authorizing Provider  acetaminophen (TYLENOL) 650 MG CR tablet Take 650 mg by mouth every 8 (eight) hours as needed for pain.    [provider]  apixaban (ELIQUIS) 5 MG TABS tablet Take 1 tablet (5 mg total) by mouth 2 (two) times daily. 01/17/20   Kroeger, Lorelee Cover., PA-C  buPROPion (WELLBUTRIN SR) 200 MG 12 hr tablet Take 200 mg by mouth daily as needed (Depression). 12/10/15   [provider]  diclofenac Sodium (VOLTAREN) 1 % GEL Apply 2 g topically as needed (pain). 07/30/16   [provider]  hydrochlorothiazide (MICROZIDE) 12.5 MG capsule Take 1 capsule (12.5 mg total) by mouth daily. 04/23/21 10/29/21  Troy Sine, MD  hydrOXYzine (ATARAX/VISTARIL) 25 MG tablet Take 25-50 mg by mouth daily as needed (Night sweats). 11/03/19   [provider]  latanoprost (XALATAN) 0.005 % ophthalmic solution 1 drop at bedtime. 09/09/21   [provider]  lisinopril (ZESTRIL) 40 MG tablet Take 40 mg by mouth daily.    [provider]  promethazine-dextromethorphan (PROMETHAZINE-DM) 6.25-15 MG/5ML syrup Take 5 mLs by mouth 4 (four) times daily as needed. 08/08/21   Volney American, PA-C  QUEtiapine (SEROQUEL) 200 MG tablet Take 200 mg by mouth daily as needed (anxiety). 07/06/16   [provider]  rosuvastatin (CRESTOR) 40 MG tablet Take 40 mg by mouth daily.    [provider]  traZODone (DESYREL) 100 MG tablet Take 50 mg by mouth at bedtime as needed for sleep. 01/09/16   [provider]  verapamil (VERELAN PM) 180 MG 24 hr capsule Take 1 capsule (180 mg total) by mouth at bedtime. 10/29/21   Troy Sine, MD      Allergies    Patient has no known allergies.    Review of Systems   Review of Systems  Constitutional:  Positive for diaphoresis.  Respiratory:  Negative for shortness of breath.   Cardiovascular:  Negative for chest pain.  Gastrointestinal:  Positive for abdominal pain. Negative for vomiting.  Genitourinary:  Positive for difficulty urinating, dysuria and flank pain. Negative for hematuria.  Musculoskeletal:  Positive  for back pain.  Neurological:  Negative for headaches.    Physical Exam Updated Vital Signs BP (!) 126/100   Pulse 76   Resp 20   Ht '6\' 4"'$  (1.93 m)   Wt (!) 165.1 kg   SpO2 96%   BMI 44.31 kg/m  Physical Exam Vitals and nursing note reviewed.  Constitutional:      General: He is not in acute distress.    Appearance: Normal appearance. He is well-developed.  HENT:     Head: Normocephalic and atraumatic.  Eyes:     Conjunctiva/sclera: Conjunctivae normal.  Cardiovascular:     Rate and Rhythm: Normal rate and regular  rhythm.     Heart sounds: No murmur heard. Pulmonary:     Effort: Pulmonary effort is normal. No respiratory distress.     Breath sounds: Normal breath sounds.  Abdominal:     Palpations: Abdomen is soft.     Tenderness: There is no abdominal tenderness. There is no guarding or rebound.  Musculoskeletal:        General: No swelling.     Cervical back: Neck supple.  Skin:    General: Skin is warm and dry.     Capillary Refill: Capillary refill takes less than 2 seconds.  Neurological:     General: No focal deficit present.     Mental Status: He is alert.     Sensory: No sensory deficit.     Motor: No weakness.     ED Results / Procedures / Treatments   Labs (all labs ordered are listed, but only abnormal results are displayed) Labs Reviewed  URINALYSIS, ROUTINE W REFLEX MICROSCOPIC - Abnormal; Notable for the following components:      Result Value   Color, Urine AMBER (*)    APPearance CLOUDY (*)    Hgb urine dipstick LARGE (*)    Protein, ur >=300 (*)    Leukocytes,Ua MODERATE (*)    RBC / HPF >50 (*)    WBC, UA >50 (*)    Bacteria, UA RARE (*)    All other components within normal limits  COMPREHENSIVE METABOLIC PANEL - Abnormal; Notable for the following components:   Potassium 3.4 (*)    Glucose, Bld 115 (*)    Creatinine, Ser 1.74 (*)    Calcium 8.4 (*)    Albumin 3.1 (*)    AST 108 (*)    ALT 62 (*)    Total Bilirubin 1.3 (*)    GFR, Estimated 41 (*)    All other components within normal limits  CBC WITH DIFFERENTIAL/PLATELET - Abnormal; Notable for the following components:   RBC 6.05 (*)    Monocytes Absolute 1.1 (*)    All other components within normal limits  CULTURE, BLOOD (ROUTINE X 2)  CULTURE, BLOOD (ROUTINE X 2)  URINE CULTURE  LACTIC ACID, PLASMA  LACTIC ACID, PLASMA  MAGNESIUM  COMPREHENSIVE METABOLIC PANEL  CBC    EKG EKG Interpretation  Date/Time:  Friday April 03 2022 12:10:15 EDT Ventricular Rate:  133 PR Interval:    QRS  Duration: 122 QT Interval:  305 QTC Calculation: 454 R Axis:   -77 Text Interpretation: Atrial flutter with predominant 2:1 AV block Ventricular premature complex Nonspecific IVCD with LAD No significant change since prior 7/17 Confirmed by Aletta Edouard [phone]) on 04/03/2022 12:13:43 PM  Radiology DG Chest Port 1 View  Result Date: 04/03/2022 CLINICAL DATA:  Weakness. EXAM: PORTABLE CHEST 1 VIEW COMPARISON:  None Available. FINDINGS: The heart size  and mediastinal contours are within normal limits. Both lungs are clear. The visualized skeletal structures are unremarkable. IMPRESSION: No active disease. Electronically Signed   By: Marijo Conception M.D.   On: 04/03/2022 13:34   CT Renal Stone Study  Result Date: 04/03/2022 CLINICAL DATA:  Flank pain, kidney stone suspected EXAM: CT ABDOMEN AND PELVIS WITHOUT CONTRAST TECHNIQUE: Multidetector CT imaging of the abdomen and pelvis was performed following the standard protocol without IV contrast. RADIATION DOSE REDUCTION: This exam was performed according to the departmental dose-optimization program which includes automated exposure control, adjustment of the mA and/or kV according to patient size and/or use of iterative reconstruction technique. COMPARISON:  CT abdomen pelvis 02/25/2016, MRI abdomen 04/17/2016. FINDINGS: Lower chest: There is a focal airspace consolidation in the medial right lower lobe (series 4, image 25). Additional eight and 6 mm pulmonary nodules in the peripheral right lower lobe (series 2, images 12 and 14). These findings are new since the prior CT in July 2017. Hepatobiliary: No focal liver abnormality is seen. The gallbladder is unremarkable. Pancreas: Unremarkable. No pancreatic ductal dilatation or surrounding inflammatory changes. Spleen: Normal in size without focal abnormality. Adrenals/Urinary Tract: Adrenal glands are unremarkable. There is symmetric perinephric stranding, increased from prior exam in July 2017. No right  renal nephrolithiasis. There is a 1.1 cm stone in the inferior pole the left kidney. No hydroureteronephrosis. No ureter stone. There is heterogeneous low-density in the lower pole the left kidney, likely related to previously seen multiple lower pole renal cyst on prior MRI. There are small renal cysts in the lower pole the right kidney also seen on prior MRI. No follow-up imaging is recommended. There is extensive pelvic inflammatory stranding along a decompressed bladder, the prostate, seminal vesicles, and to a much lesser degree the rectum, which demonstrates no significant wall thickening. Stomach/Bowel: The stomach is within normal limits. There is no evidence of bowel obstruction.The appendix is normal. Colonic diverticulosis. No diverticulitis. No bowel wall thickening. Vascular/Lymphatic: Scattered atherosclerosis. No AAA. No lymphadenopathy. Reproductive: Enlarged prostate gland with adjacent pelvic inflammatory stranding. Other: No focal fluid collection. No free air. Small fat containing ventral hernia in the upper abdomen with a 1.6 cm aperture (series 2, image 40). Small fat containing inguinal hernias, left greater than right. No bowel containing hernia. Musculoskeletal: No acute osseous abnormality. No suspicious osseous lesion. Mild bilateral hip osteoarthritis. Multilevel degenerative changes of the spine. IMPRESSION: Extensive pelvic inflammatory stranding, centered around a decompressed bladder, the prostate, and the seminal vesicles, suggesting cystitis/prostatitis. Recommend correlation with urinalysis. Symmetric perinephric stranding bilaterally, increased from prior exam suggesting potential upper tract infectious involvement, without evidence of obstructive uropathy. Nonobstructive 1.1 cm stone in the inferior pole of the left kidney. Focal airspace consolidation in the medial right lower lobe, which could be atelectasis or infection. Additional 8 mm and 6 mm nodules in the periphery of the  right lower lobe. Recommend follow-up chest CT in 3 months to assess for interval change. Electronically Signed   By: Maurine Simmering M.D.   On: 04/03/2022 13:08    Procedures .Critical Care  Performed by: Hayden Rasmussen, MD Authorized by: Hayden Rasmussen, MD   Critical care provider statement:    Critical care time (minutes):  45   Critical care time was exclusive of:  Separately billable procedures and treating other patients   Critical care was necessary to treat or prevent imminent or life-threatening deterioration of the following conditions:  Sepsis   Critical care was time spent  personally by me on the following activities:  Development of treatment plan with patient or surrogate, discussions with consultants, evaluation of patient's response to treatment, examination of patient, obtaining history from patient or surrogate, ordering and performing treatments and interventions, ordering and review of laboratory studies, ordering and review of radiographic studies, pulse oximetry, re-evaluation of patient's condition and review of old charts   I assumed direction of critical care for this patient from another provider in my specialty: no       Medications Ordered in ED Medications  verapamil (CALAN-SR) CR tablet 180 mg (has no administration in time range)  hydrOXYzine (ATARAX) tablet 25-50 mg (has no administration in time range)  QUEtiapine (SEROQUEL) tablet 200 mg (has no administration in time range)  traZODone (DESYREL) tablet 50 mg (has no administration in time range)  apixaban (ELIQUIS) tablet 5 mg (has no administration in time range)  latanoprost (XALATAN) 0.005 % ophthalmic solution 1 drop (has no administration in time range)  lactated ringers infusion ( Intravenous New Bag/Given 04/03/22 1618)  acetaminophen (TYLENOL) tablet 650 mg (has no administration in time range)    Or  acetaminophen (TYLENOL) suppository 650 mg (has no administration in time range)  HYDROmorphone  (DILAUDID) injection 0.5-1 mg (has no administration in time range)  ondansetron (ZOFRAN) tablet 4 mg (has no administration in time range)    Or  ondansetron (ZOFRAN) injection 4 mg (has no administration in time range)  cefTRIAXone (ROCEPHIN) 2 g in sodium chloride 0.9 % 100 mL IVPB (has no administration in time range)  sodium chloride 0.9 % bolus 1,000 mL (0 mLs Intravenous Stopped 04/03/22 1215)  cefTRIAXone (ROCEPHIN) 2 g in sodium chloride 0.9 % 100 mL IVPB (0 g Intravenous Stopped 04/03/22 1451)  potassium chloride SA (KLOR-CON M) CR tablet 40 mEq (40 mEq Oral Given 04/03/22 1618)  diltiazem (CARDIZEM) injection 10 mg (10 mg Intravenous Given 04/03/22 1615)    ED Course/ Medical Decision Making/ A&P Clinical Course as of 04/03/22 1902  Fri Apr 03, 2022  1452 Discussed with Dr. Manuella Ghazi [facility] hospitalist who will evaluate patient for admission. [MB]    Clinical Course User Index [MB] Hayden Rasmussen, MD                           Medical Decision Making Amount and/or Complexity of Data Reviewed Labs: ordered. Radiology: ordered.  Risk Decision regarding hospitalization.   This patient complains of low abdominal pain back pain difficulty urinating decreased appetite; this involves an extensive number of treatment Options and is a complaint that carries with it a high risk of complications and morbidity. The differential includes sepsis, Sirs, UTI, prostatitis, diverticulitis, abscess, pyelonephritis, ureteral colic  I ordered, reviewed and interpreted labs, which included CBC with normal white count normal hemoglobin, chemistries with mildly low potassium elevated creatinine elevated LFTs, urinalysis concerning for infection, lactate normal, blood and urine culture sent I ordered medication IV fluids IV antibiotics oral potassium and reviewed PMP when indicated. I ordered imaging studies which included chest x-ray and CT renal and I independently    visualized and interpreted  imaging which showed inflammatory changes around bladder prostate and kidneys Additional history obtained from patient significant other Previous records obtained and reviewed in epic including prior PCP visit I consulted Dr. Manuella Ghazi [facility] hospitalist and discussed lab and imaging findings and discussed disposition.  Cardiac monitoring reviewed, a flutter with rapid ventricular response Social determinants considered, no significant barriers Critical Interventions:  Initiation of fluids and IV antibiotics for SIRS criteria.  After the interventions stated above, I reevaluated the patient and found patient symptoms to be improved although not resolved Admission and further testing considered, he would benefit from mission for further treatment with fluids antibiotics and rate control for his A-fib         Final Clinical Impression(s) / ED Diagnoses Final diagnoses:  Pyelonephritis  SIRS (systemic inflammatory response syndrome) ([place])  Atrial fibrillation with RVR ([place])    Rx / DC Orders ED Discharge Orders     None         Hayden Rasmussen, MD 04/03/22 Einar Crow

## 2022-04-03 NOTE — H&P (Addendum)
History and Physical    Chaos Carlile UEA:[id number] DOB: 1946-04-18 DOA: 04/03/2022  PCP: Glenda Chroman, MD   Patient coming from: PCP office  Chief Complaint: Urinary frequency with abdominal and back pain  HPI: Robert Warren is a 76 y.o. male with medical history significant for morbid obesity, anxiety, atrial fibrillation, hypertension, dyslipidemia, and sleep apnea who presented to the ED from his PCP office on account of worsening abdominal and back pain.  He apparently began having increasing urinary frequency and dysuria in the last 1-2 weeks and now he is having some issues with urinary retention as well.  He denies any hematuria, fevers, chills, nausea, vomiting, chest pain, or shortness of breath.  He has been taking his home medications as prescribed and denies much of an appetite and states that his p.o. intake has been decreased from normal.   ED Course: He is noted to have tachycardia and tachypnea with heart rates in the 110-120 bpm range with noted atrial flutter/fibrillation on EKG.  CT abdomen pelvis demonstrating findings of cystitis, prostatitis, and even some concern of pyelonephritis.  Nonobstructing nephrolithiasis noted in the left renal pelvis.  He has been given some IV fluid and started on Rocephin with cultures pending.  Review of Systems: Reviewed as noted above, otherwise negative.  Past Medical History:  Diagnosis Date   Anxiety    Atrial fibrillation ([place])    Hypertension    Shortness of breath    Sleep apnea     Past Surgical History:  Procedure Laterality Date   Anal cyst Removal     COLONOSCOPY N/A 06/20/2014   Procedure: COLONOSCOPY;  Surgeon: Rogene Houston, MD;  Location: [facility];  Service: Endoscopy;  Laterality: N/A;  1030   COLONOSCOPY WITH PROPOFOL N/A 03/14/2021   Procedure: COLONOSCOPY WITH PROPOFOL;  Surgeon: Harvel Quale, MD;  Location: [facility];  Service: Gastroenterology;  Laterality: N/A;  10:50    POLYPECTOMY  03/14/2021   Procedure: POLYPECTOMY;  Surgeon: Harvel Quale, MD;  Location: [facility];  Service: Gastroenterology;;     reports that he quit smoking about 25 years ago. His smoking use included cigarettes. He has a 30.00 pack-year smoking history. He has never used smokeless tobacco. He reports current alcohol use. He reports that he does not use drugs.  No Known Allergies  Family History  Problem Relation Age of Onset   Heart disease Mother    Cancer - Lung Father    Heart attack Father     Prior to Admission medications   Medication Sig Start Date End Date Taking? Authorizing Provider  acetaminophen (TYLENOL) 650 MG CR tablet Take 650 mg by mouth every 8 (eight) hours as needed for pain.    [provider]  apixaban (ELIQUIS) 5 MG TABS tablet Take 1 tablet (5 mg total) by mouth 2 (two) times daily. 01/17/20   Kroeger, Lorelee Cover., PA-C  buPROPion (WELLBUTRIN SR) 200 MG 12 hr tablet Take 200 mg by mouth daily as needed (Depression). 12/10/15   [provider]  diclofenac Sodium (VOLTAREN) 1 % GEL Apply 2 g topically as needed (pain). 07/30/16   [provider]  hydrochlorothiazide (MICROZIDE) 12.5 MG capsule Take 1 capsule (12.5 mg total) by mouth daily. 04/23/21 10/29/21  Troy Sine, MD  hydrOXYzine (ATARAX/VISTARIL) 25 MG tablet Take 25-50 mg by mouth daily as needed (Night sweats). 11/03/19   [provider]  latanoprost (XALATAN) 0.005 % ophthalmic solution 1 drop at bedtime. 09/09/21  [provider]  lisinopril (ZESTRIL) 40 MG tablet Take 40 mg by mouth daily.    [provider]  promethazine-dextromethorphan (PROMETHAZINE-DM) 6.25-15 MG/5ML syrup Take 5 mLs by mouth 4 (four) times daily as needed. 08/08/21   Volney American, PA-C  QUEtiapine (SEROQUEL) 200 MG tablet Take 200 mg by mouth daily as needed (anxiety). 07/06/16   [provider]  rosuvastatin (CRESTOR) 40 MG tablet Take 40 mg  by mouth daily.    [provider]  traZODone (DESYREL) 100 MG tablet Take 50 mg by mouth at bedtime as needed for sleep. 01/09/16   [provider]  verapamil (VERELAN PM) 180 MG 24 hr capsule Take 1 capsule (180 mg total) by mouth at bedtime. 10/29/21   Troy Sine, MD    Physical Exam: Vitals:   04/03/22 1129 04/03/22 1137 04/03/22 1200 04/03/22 1347  BP:  (!) 126/100 99/65 105/76  Pulse:  76 (!) 119 (!) 126  Resp:  20 (!) 22 (!) 25  Temp:   98.2 F (36.8 C)   SpO2:  96% 95% 95%  Weight: (!) 165.1 kg     Height: '6\' 4"'$  (1.93 m)       Constitutional: NAD, calm, comfortable, morbidly obese Vitals:   04/03/22 1129 04/03/22 1137 04/03/22 1200 04/03/22 1347  BP:  (!) 126/100 99/65 105/76  Pulse:  76 (!) 119 (!) 126  Resp:  20 (!) 22 (!) 25  Temp:   98.2 F (36.8 C)   SpO2:  96% 95% 95%  Weight: (!) 165.1 kg     Height: '6\' 4"'$  (1.93 m)      Eyes: lids and conjunctivae normal Neck: normal, supple Respiratory: clear to auscultation bilaterally. Normal respiratory effort. No accessory muscle use.  Cardiovascular: Regular rate and rhythm, no murmurs. Abdomen: no tenderness, no distention. Bowel sounds positive.  Abdominal hernia Musculoskeletal:  No edema. Skin: no rashes, lesions, ulcers.  Psychiatric: Flat affect  Labs on Admission: I have personally reviewed following labs and imaging studies  CBC: Recent Labs  Lab 04/03/22 1222  WBC 7.9  NEUTROABS 5.5  HGB 15.9  HCT 49.1  MCV 81.2  PLT 629   Basic Metabolic Panel: Recent Labs  Lab 04/03/22 1222  NA 138  K 3.4*  CL 108  CO2 22  GLUCOSE 115*  BUN 21  CREATININE 1.74*  CALCIUM 8.4*   GFR: Estimated Creatinine Clearance: 62.2 mL/min (A) (by C-G formula based on SCr of 1.74 mg/dL (H)). Liver Function Tests: Recent Labs  Lab 04/03/22 1222  AST 108*  ALT 62*  ALKPHOS 73  BILITOT 1.3*  PROT 7.0  ALBUMIN 3.1*   No results for input(s): "LIPASE", "AMYLASE" in the last 168 hours. No  results for input(s): "AMMONIA" in the last 168 hours. Coagulation Profile: No results for input(s): "INR", "PROTIME" in the last 168 hours. Cardiac Enzymes: No results for input(s): "CKTOTAL", "CKMB", "CKMBINDEX", "TROPONINI" in the last 168 hours. BNP (last 3 results) No results for input(s): "PROBNP" in the last 8760 hours. HbA1C: No results for input(s): "HGBA1C" in the last 72 hours. CBG: No results for input(s): "GLUCAP" in the last 168 hours. Lipid Profile: No results for input(s): "CHOL", "HDL", "LDLCALC", "TRIG", "CHOLHDL", "LDLDIRECT" in the last 72 hours. Thyroid Function Tests: No results for input(s): "TSH", "T4TOTAL", "FREET4", "T3FREE", "THYROIDAB" in the last 72 hours. Anemia Panel: No results for input(s): "VITAMINB12", "FOLATE", "FERRITIN", "TIBC", "IRON", "RETICCTPCT" in the last 72 hours. Urine analysis:    Component Value  Date/Time   COLORURINE AMBER (A) 04/03/2022 1409   APPEARANCEUR CLOUDY (A) 04/03/2022 1409   LABSPEC 1.025 04/03/2022 1409   PHURINE 5.0 04/03/2022 1409   GLUCOSEU NEGATIVE 04/03/2022 1409   HGBUR LARGE (A) 04/03/2022 1409   BILIRUBINUR NEGATIVE 04/03/2022 1409   KETONESUR NEGATIVE 04/03/2022 1409   PROTEINUR >=300 (A) 04/03/2022 1409   NITRITE NEGATIVE 04/03/2022 1409   LEUKOCYTESUR MODERATE (A) 04/03/2022 1409    Radiological Exams on Admission: DG Chest Port 1 View  Result Date: 04/03/2022 CLINICAL DATA:  Weakness. EXAM: PORTABLE CHEST 1 VIEW COMPARISON:  None Available. FINDINGS: The heart size and mediastinal contours are within normal limits. Both lungs are clear. The visualized skeletal structures are unremarkable. IMPRESSION: No active disease. Electronically Signed   By: Marijo Conception M.D.   On: 04/03/2022 13:34   CT Renal Stone Study  Result Date: 04/03/2022 CLINICAL DATA:  Flank pain, kidney stone suspected EXAM: CT ABDOMEN AND PELVIS WITHOUT CONTRAST TECHNIQUE: Multidetector CT imaging of the abdomen and pelvis was performed  following the standard protocol without IV contrast. RADIATION DOSE REDUCTION: This exam was performed according to the departmental dose-optimization program which includes automated exposure control, adjustment of the mA and/or kV according to patient size and/or use of iterative reconstruction technique. COMPARISON:  CT abdomen pelvis 02/25/2016, MRI abdomen 04/17/2016. FINDINGS: Lower chest: There is a focal airspace consolidation in the medial right lower lobe (series 4, image 25). Additional eight and 6 mm pulmonary nodules in the peripheral right lower lobe (series 2, images 12 and 14). These findings are new since the prior CT in July 2017. Hepatobiliary: No focal liver abnormality is seen. The gallbladder is unremarkable. Pancreas: Unremarkable. No pancreatic ductal dilatation or surrounding inflammatory changes. Spleen: Normal in size without focal abnormality. Adrenals/Urinary Tract: Adrenal glands are unremarkable. There is symmetric perinephric stranding, increased from prior exam in July 2017. No right renal nephrolithiasis. There is a 1.1 cm stone in the inferior pole the left kidney. No hydroureteronephrosis. No ureter stone. There is heterogeneous low-density in the lower pole the left kidney, likely related to previously seen multiple lower pole renal cyst on prior MRI. There are small renal cysts in the lower pole the right kidney also seen on prior MRI. No follow-up imaging is recommended. There is extensive pelvic inflammatory stranding along a decompressed bladder, the prostate, seminal vesicles, and to a much lesser degree the rectum, which demonstrates no significant wall thickening. Stomach/Bowel: The stomach is within normal limits. There is no evidence of bowel obstruction.The appendix is normal. Colonic diverticulosis. No diverticulitis. No bowel wall thickening. Vascular/Lymphatic: Scattered atherosclerosis. No AAA. No lymphadenopathy. Reproductive: Enlarged prostate gland with adjacent  pelvic inflammatory stranding. Other: No focal fluid collection. No free air. Small fat containing ventral hernia in the upper abdomen with a 1.6 cm aperture (series 2, image 40). Small fat containing inguinal hernias, left greater than right. No bowel containing hernia. Musculoskeletal: No acute osseous abnormality. No suspicious osseous lesion. Mild bilateral hip osteoarthritis. Multilevel degenerative changes of the spine. IMPRESSION: Extensive pelvic inflammatory stranding, centered around a decompressed bladder, the prostate, and the seminal vesicles, suggesting cystitis/prostatitis. Recommend correlation with urinalysis. Symmetric perinephric stranding bilaterally, increased from prior exam suggesting potential upper tract infectious involvement, without evidence of obstructive uropathy. Nonobstructive 1.1 cm stone in the inferior pole of the left kidney. Focal airspace consolidation in the medial right lower lobe, which could be atelectasis or infection. Additional 8 mm and 6 mm nodules in the periphery of the right  lower lobe. Recommend follow-up chest CT in 3 months to assess for interval change. Electronically Signed   By: Maurine Simmering M.D.   On: 04/03/2022 13:08    EKG: Independently reviewed.  Atrial flutter 133 bpm  Assessment/Plan Principal Problem:   Severe sepsis (H[facility]) Active Problems:   Atrial fibrillation with RVR (HCC)   Lower urinary tract infectious disease   BPH (benign prostatic hyperplasia)   Morbid obesity (HCC)   OSA (obstructive sleep apnea)   Hyperlipidemia   Current use of long term anticoagulation    Severe sepsis secondary to UTI/prostatitis/pyelonephritis -Noted to have tachycardia and tachypnea for SIRS criteria on admission along with AKI -Continue Rocephin 2 g daily as ordered -Monitor blood and urine cultures -Follow labs and continue IV fluid  AKI versus progressive CKD -Creatinine 1.7 with baseline near 1 -Continue IV fluid hydration and monitor  Mild  transaminitis -Possibly related to sepsis physiology -Cannot continue to trend -Could consider acute hepatitis panel and ultrasound as needed -Hold statin for now  Mild hypokalemia -Replete and reevaluate in a.m.  Atrial fibrillation/flutter with RVR -Bolus dose of Cardizem and monitor for need of drip; observe in stepdown unit -On Eliquis for anticoagulation -Continue verapamil  Hypertension -Continue verapamil -Hold lisinopril and HCTZ  Anxiety disorder -Can continue home medications  Morbid obesity -BMI 44.31 -Lifestyle changes outpatient  OSA -CPAP at night   DVT prophylaxis: Eliquis Code Status: Full Family Communication: Wife at bedside Disposition Plan:Admit for UTI treatment Consults called:None Admission status: Inpatient, Tele  Severity of Illness: The appropriate patient status for this patient is INPATIENT. Inpatient status is judged to be reasonable and necessary in order to provide the required intensity of service to ensure the patient's safety. The patient's presenting symptoms, physical exam findings, and initial radiographic and laboratory data in the context of their chronic comorbidities is felt to place them at high risk for further clinical deterioration. Furthermore, it is not anticipated that the patient will be medically stable for discharge from the hospital within 2 midnights of admission.   * I certify that at the point of admission it is my clinical judgment that the patient will require inpatient hospital care spanning beyond 2 midnights from the point of admission due to high intensity of service, high risk for further deterioration and high frequency of surveillance required.*   Cayle Cordoba D Stepehn Eckard DO [facility]  If 7PM-7AM, please contact night-coverage [email or web address]  04/03/2022, 3:23 PM

## 2022-04-04 DIAGNOSIS — R6520 Severe sepsis without septic shock: Secondary | ICD-10-CM | POA: Diagnosis not present

## 2022-04-04 DIAGNOSIS — A419 Sepsis, unspecified organism: Secondary | ICD-10-CM | POA: Diagnosis not present

## 2022-04-04 LAB — COMPREHENSIVE METABOLIC PANEL
ALT: 90 U/L — ABNORMAL HIGH (ref 0–44)
AST: 153 U/L — ABNORMAL HIGH (ref 15–41)
Albumin: 2.8 g/dL — ABNORMAL LOW (ref 3.5–5.0)
Alkaline Phosphatase: 68 U/L (ref 38–126)
Anion gap: 6 (ref 5–15)
BUN: 24 mg/dL — ABNORMAL HIGH (ref 8–23)
CO2: 24 mmol/L (ref 22–32)
Calcium: 8.4 mg/dL — ABNORMAL LOW (ref 8.9–10.3)
Chloride: 111 mmol/L (ref 98–111)
Creatinine, Ser: 1.38 mg/dL — ABNORMAL HIGH (ref 0.61–1.24)
GFR, Estimated: 54 mL/min — ABNORMAL LOW (ref >60)
Glucose, Bld: 108 mg/dL — ABNORMAL HIGH (ref 70–99)
Potassium: 3.3 mmol/L — ABNORMAL LOW (ref 3.5–5.1)
Sodium: 141 mmol/L (ref 135–145)
Total Bilirubin: 1 mg/dL (ref 0.3–1.2)
Total Protein: 6.6 g/dL (ref 6.5–8.1)

## 2022-04-04 LAB — CBC
HCT: 48 % (ref 39.0–52.0)
Hemoglobin: 15.4 g/dL (ref 13.0–17.0)
MCH: 26.5 pg (ref 26.0–34.0)
MCHC: 32.1 g/dL (ref 30.0–36.0)
MCV: 82.6 fL (ref 80.0–100.0)
Platelets: 185 10*3/uL (ref 150–400)
RBC: 5.81 MIL/uL (ref 4.22–5.81)
RDW: 14.4 % (ref 11.5–15.5)
WBC: 5.8 10*3/uL (ref 4.0–10.5)
nRBC: 0 % (ref 0.0–0.2)

## 2022-04-04 LAB — MAGNESIUM: Magnesium: 2.3 mg/dL (ref 1.7–2.4)

## 2022-04-04 LAB — HEPATITIS PANEL, ACUTE
HCV Ab: NONREACTIVE
Hep A IgM: NONREACTIVE
Hep B C IgM: NONREACTIVE
Hepatitis B Surface Ag: NONREACTIVE

## 2022-04-04 LAB — GLUCOSE, CAPILLARY: Glucose-Capillary: 90 mg/dL (ref 70–99)

## 2022-04-04 MED ORDER — HYDRALAZINE HCL 20 MG/ML IJ SOLN
10.0000 mg | INTRAMUSCULAR | Status: DC | PRN
  Administered 2022-04-04 (×2): 10 mg via INTRAVENOUS
  Filled 2022-04-04 (×2): qty 1

## 2022-04-04 MED ORDER — LISINOPRIL 10 MG PO TABS
40.0000 mg | ORAL_TABLET | Freq: Every day | ORAL | Status: DC
Start: 2022-04-04 — End: 2022-04-04

## 2022-04-04 MED ORDER — POTASSIUM CHLORIDE CRYS ER 20 MEQ PO TBCR
40.0000 meq | EXTENDED_RELEASE_TABLET | Freq: Two times a day (BID) | ORAL | Status: DC
  Administered 2022-04-04 – 2022-04-06 (×5): 40 meq via ORAL
  Filled 2022-04-04 (×5): qty 2

## 2022-04-04 NOTE — Progress Notes (Signed)
PROGRESS NOTE    Robert Warren  KPT:[id number] DOB: [birth year] DOA: 04/03/2022 PCP: Glenda Chroman, MD   Brief Narrative:    Robert Warren is a 76 y.o. male with medical history significant for morbid obesity, anxiety, atrial fibrillation, hypertension, dyslipidemia, and sleep apnea who presented to the ED from his PCP office on account of worsening abdominal and back pain.  He apparently began having increasing urinary frequency and dysuria as well.  He was admitted with severe sepsis secondary to UTI with prostatitis as well as suspicion of pyelonephritis.  Assessment & Plan:   Principal Problem:   Severe sepsis ([place]) Active Problems:   Atrial fibrillation with RVR (HCC)   Lower urinary tract infectious disease   BPH (benign prostatic hyperplasia)   Morbid obesity (HCC)   OSA (obstructive sleep apnea)   Hyperlipidemia   Current use of long term anticoagulation  Assessment and Plan:  Severe sepsis secondary to UTI/prostatitis/pyelonephritis -Noted to have tachycardia and tachypnea for SIRS criteria on admission along with AKI -Continue Rocephin 2 g daily as ordered -Blood cultures with no growth to date and urine cultures pending -Follow labs and continue gentle IV fluid   AKI versus progressive CKD-improving -Creatinine 1.7 with baseline near 1 -Continue IV fluid hydration and monitor   Mild transaminitis -Possibly related to sepsis physiology -Cannot continue to trend -Could consider acute hepatitis panel and ultrasound as needed -Hold statin for now   Mild hypokalemia -Replete and reevaluate in a.m.   Atrial fibrillation/flutter with RVR-improved -Okay for telemetry this morning with heart rates improved -On Eliquis for anticoagulation -Continue verapamil   Hypertension -Continue verapamil -Hold lisinopril and HCTZ   Anxiety disorder -Can continue home medications   Morbid obesity -BMI 44.31 -Lifestyle changes outpatient   OSA -CPAP at night     DVT prophylaxis: Eliquis Code Status: Full Family Communication: Discussed with wife at bedside 8/11 Disposition Plan:  Status is: Inpatient Remains inpatient appropriate because: Need for ongoing IV antibiotics   Consultants:  None  Procedures:  None  Antimicrobials:  Anti-infectives (From admission, onward)    Start     Dose/Rate Route Frequency Ordered Stop   04/04/22 1400  cefTRIAXone (ROCEPHIN) 2 g in sodium chloride 0.9 % 100 mL IVPB        2 g 200 mL/hr over 30 Minutes Intravenous Every 24 hours 04/03/22 1529     04/03/22 1400  cefTRIAXone (ROCEPHIN) 2 g in sodium chloride 0.9 % 100 mL IVPB        2 g 200 mL/hr over 30 Minutes Intravenous  Once 04/03/22 1354 04/03/22 1451      Subjective: Patient seen and evaluated today with no new acute complaints or concerns. No acute concerns or events noted overnight.  He denies any further back or abdominal pain.  Denies dysuria.  Objective: Vitals:   04/03/22 2336 04/04/22 0430 04/04/22 0500 04/04/22 1000  BP:  (!) 147/110 (!) 100/54 101/70  Pulse: 89 (!) 33 (!) 43 83  Resp:  (!) '7 12 20  '$ Temp:   98.1 F (36.7 C)   TempSrc:   Oral   SpO2: 98% 96% 91% 96%  Weight:      Height:        Intake/Output Summary (Last 24 hours) at 04/04/2022 1122 Last data filed at 04/04/2022 0754 Gross per 24 hour  Intake 1000 ml  Output --  Net 1000 ml   Filed Weights   04/03/22 1129  Weight: (!) 165.1 kg    Examination:  General exam: Appears calm and comfortable, obese Respiratory system: Clear to auscultation. Respiratory effort normal. Cardiovascular system: S1 & S2 heard, RRR.  Gastrointestinal system: Abdomen is soft Central nervous system: Alert and awake Extremities: No edema Skin: No significant lesions noted Psychiatry: Flat affect.    Data Reviewed: I have personally reviewed following labs and imaging studies  CBC: Recent Labs  Lab 04/03/22 1222 04/04/22 0451  WBC 7.9 5.8  NEUTROABS 5.5  --   HGB 15.9  15.4  HCT 49.1 48.0  MCV 81.2 82.6  PLT 216 867   Basic Metabolic Panel: Recent Labs  Lab 04/03/22 1222 04/04/22 0451  NA 138 141  K 3.4* 3.3*  CL 108 111  CO2 22 24  GLUCOSE 115* 108*  BUN 21 24*  CREATININE 1.74* 1.38*  CALCIUM 8.4* 8.4*  MG  --  2.3   GFR: Estimated Creatinine Clearance: 78.4 mL/min (A) (by C-G formula based on SCr of 1.38 mg/dL (H)). Liver Function Tests: Recent Labs  Lab 04/03/22 1222 04/04/22 0451  AST 108* 153*  ALT 62* 90*  ALKPHOS 73 68  BILITOT 1.3* 1.0  PROT 7.0 6.6  ALBUMIN 3.1* 2.8*   No results for input(s): "LIPASE", "AMYLASE" in the last 168 hours. No results for input(s): "AMMONIA" in the last 168 hours. Coagulation Profile: No results for input(s): "INR", "PROTIME" in the last 168 hours. Cardiac Enzymes: No results for input(s): "CKTOTAL", "CKMB", "CKMBINDEX", "TROPONINI" in the last 168 hours. BNP (last 3 results) No results for input(s): "PROBNP" in the last 8760 hours. HbA1C: No results for input(s): "HGBA1C" in the last 72 hours. CBG: No results for input(s): "GLUCAP" in the last 168 hours. Lipid Profile: No results for input(s): "CHOL", "HDL", "LDLCALC", "TRIG", "CHOLHDL", "LDLDIRECT" in the last 72 hours. Thyroid Function Tests: No results for input(s): "TSH", "T4TOTAL", "FREET4", "T3FREE", "THYROIDAB" in the last 72 hours. Anemia Panel: No results for input(s): "VITAMINB12", "FOLATE", "FERRITIN", "TIBC", "IRON", "RETICCTPCT" in the last 72 hours. Sepsis Labs: Recent Labs  Lab 04/03/22 1222 04/03/22 1441  LATICACIDVEN 1.6 1.6    Recent Results (from the past 240 hour(s))  Culture, blood (routine x 2)     Status: None (Preliminary result)   Collection Time: 04/03/22 12:13 PM   Specimen: BLOOD  Result Value Ref Range Status   Specimen Description BLOOD  Final   Special Requests NONE  Final   Culture   Final    NO GROWTH < 12 HOURS Performed at [facility], [address] [place], [place] [address]    Report  Status PENDING  Incomplete  Culture, blood (routine x 2)     Status: None (Preliminary result)   Collection Time: 04/03/22 12:22 PM   Specimen: Right Antecubital; Blood  Result Value Ref Range Status   Specimen Description   Final    RIGHT ANTECUBITAL BOTTLES DRAWN AEROBIC AND ANAEROBIC   Special Requests Blood Culture adequate volume  Final   Culture   Final    NO GROWTH < 12 HOURS Performed at [facility], [address] [place], [place] [address]    Report Status PENDING  Incomplete         Radiology Studies: DG Chest Port 1 View  Result Date: 04/03/2022 CLINICAL DATA:  Weakness. EXAM: PORTABLE CHEST 1 VIEW COMPARISON:  None Available. FINDINGS: The heart size and mediastinal contours are within normal limits. Both lungs are clear. The visualized skeletal structures are unremarkable. IMPRESSION: No active disease. Electronically Signed   By: Sabino Dick  Jr M.D.   On: 04/03/2022 13:34   CT Renal Stone Study  Result Date: 04/03/2022 CLINICAL DATA:  Flank pain, kidney stone suspected EXAM: CT ABDOMEN AND PELVIS WITHOUT CONTRAST TECHNIQUE: Multidetector CT imaging of the abdomen and pelvis was performed following the standard protocol without IV contrast. RADIATION DOSE REDUCTION: This exam was performed according to the departmental dose-optimization program which includes automated exposure control, adjustment of the mA and/or kV according to patient size and/or use of iterative reconstruction technique. COMPARISON:  CT abdomen pelvis 02/25/2016, MRI abdomen 04/17/2016. FINDINGS: Lower chest: There is a focal airspace consolidation in the medial right lower lobe (series 4, image 25). Additional eight and 6 mm pulmonary nodules in the peripheral right lower lobe (series 2, images 12 and 14). These findings are new since the prior CT in July 2017. Hepatobiliary: No focal liver abnormality is seen. The gallbladder is unremarkable. Pancreas: Unremarkable. No pancreatic ductal dilatation or  surrounding inflammatory changes. Spleen: Normal in size without focal abnormality. Adrenals/Urinary Tract: Adrenal glands are unremarkable. There is symmetric perinephric stranding, increased from prior exam in July 2017. No right renal nephrolithiasis. There is a 1.1 cm stone in the inferior pole the left kidney. No hydroureteronephrosis. No ureter stone. There is heterogeneous low-density in the lower pole the left kidney, likely related to previously seen multiple lower pole renal cyst on prior MRI. There are small renal cysts in the lower pole the right kidney also seen on prior MRI. No follow-up imaging is recommended. There is extensive pelvic inflammatory stranding along a decompressed bladder, the prostate, seminal vesicles, and to a much lesser degree the rectum, which demonstrates no significant wall thickening. Stomach/Bowel: The stomach is within normal limits. There is no evidence of bowel obstruction.The appendix is normal. Colonic diverticulosis. No diverticulitis. No bowel wall thickening. Vascular/Lymphatic: Scattered atherosclerosis. No AAA. No lymphadenopathy. Reproductive: Enlarged prostate gland with adjacent pelvic inflammatory stranding. Other: No focal fluid collection. No free air. Small fat containing ventral hernia in the upper abdomen with a 1.6 cm aperture (series 2, image 40). Small fat containing inguinal hernias, left greater than right. No bowel containing hernia. Musculoskeletal: No acute osseous abnormality. No suspicious osseous lesion. Mild bilateral hip osteoarthritis. Multilevel degenerative changes of the spine. IMPRESSION: Extensive pelvic inflammatory stranding, centered around a decompressed bladder, the prostate, and the seminal vesicles, suggesting cystitis/prostatitis. Recommend correlation with urinalysis. Symmetric perinephric stranding bilaterally, increased from prior exam suggesting potential upper tract infectious involvement, without evidence of obstructive  uropathy. Nonobstructive 1.1 cm stone in the inferior pole of the left kidney. Focal airspace consolidation in the medial right lower lobe, which could be atelectasis or infection. Additional 8 mm and 6 mm nodules in the periphery of the right lower lobe. Recommend follow-up chest CT in 3 months to assess for interval change. Electronically Signed   By: Maurine Simmering M.D.   On: 04/03/2022 13:08        Scheduled Meds:  apixaban  5 mg Oral BID   latanoprost  1 drop Both Eyes QHS   potassium chloride  40 mEq Oral BID   verapamil  180 mg Oral QHS   Continuous Infusions:  cefTRIAXone (ROCEPHIN)  IV     lactated ringers Stopped (04/04/22 0754)     LOS: 1 day    Time spent: 35 minutes    Mio Schellinger Darleen Crocker, DO [facility]  If 7PM-7AM, please contact night-coverage [email or web address] 04/04/2022, 11:22 AM

## 2022-04-04 NOTE — ED Notes (Signed)
Pt sat up for breakfast tray. Nurse aware

## 2022-04-05 DIAGNOSIS — A419 Sepsis, unspecified organism: Secondary | ICD-10-CM | POA: Diagnosis not present

## 2022-04-05 DIAGNOSIS — R6520 Severe sepsis without septic shock: Secondary | ICD-10-CM | POA: Diagnosis not present

## 2022-04-05 LAB — CBC
HCT: 43.7 % (ref 39.0–52.0)
Hemoglobin: 13.9 g/dL (ref 13.0–17.0)
MCH: 26.4 pg (ref 26.0–34.0)
MCHC: 31.8 g/dL (ref 30.0–36.0)
MCV: 82.9 fL (ref 80.0–100.0)
Platelets: 197 10*3/uL (ref 150–400)
RBC: 5.27 MIL/uL (ref 4.22–5.81)
RDW: 14.4 % (ref 11.5–15.5)
WBC: 5.7 10*3/uL (ref 4.0–10.5)
nRBC: 0 % (ref 0.0–0.2)

## 2022-04-05 LAB — COMPREHENSIVE METABOLIC PANEL
ALT: 74 U/L — ABNORMAL HIGH (ref 0–44)
AST: 87 U/L — ABNORMAL HIGH (ref 15–41)
Albumin: 2.6 g/dL — ABNORMAL LOW (ref 3.5–5.0)
Alkaline Phosphatase: 63 U/L (ref 38–126)
Anion gap: 4 — ABNORMAL LOW (ref 5–15)
BUN: 18 mg/dL (ref 8–23)
CO2: 28 mmol/L (ref 22–32)
Calcium: 8.3 mg/dL — ABNORMAL LOW (ref 8.9–10.3)
Chloride: 110 mmol/L (ref 98–111)
Creatinine, Ser: 1.03 mg/dL (ref 0.61–1.24)
GFR, Estimated: >60 mL/min (ref >60)
Glucose, Bld: 96 mg/dL (ref 70–99)
Potassium: 4 mmol/L (ref 3.5–5.1)
Sodium: 142 mmol/L (ref 135–145)
Total Bilirubin: 0.7 mg/dL (ref 0.3–1.2)
Total Protein: 6.2 g/dL — ABNORMAL LOW (ref 6.5–8.1)

## 2022-04-05 LAB — MAGNESIUM: Magnesium: 2.3 mg/dL (ref 1.7–2.4)

## 2022-04-05 NOTE — Progress Notes (Signed)
PROGRESS NOTE    Robert Warren  WGN:[id number] DOB: 1946-05-06 DOA: 04/03/2022 PCP: Glenda Chroman, MD   Brief Narrative:    Robert Warren is a 76 y.o. male with medical history significant for morbid obesity, anxiety, atrial fibrillation, hypertension, dyslipidemia, and sleep apnea who presented to the ED from his PCP office on account of worsening abdominal and back pain.  He apparently began having increasing urinary frequency and dysuria as well.  He was admitted with severe sepsis secondary to UTI with prostatitis as well as suspicion of pyelonephritis.  He is now noted to have gram-negative rod UTI.  Assessment & Plan:   Principal Problem:   Severe sepsis ([place]) Active Problems:   Atrial fibrillation with RVR (HCC)   Lower urinary tract infectious disease   BPH (benign prostatic hyperplasia)   Morbid obesity (HCC)   OSA (obstructive sleep apnea)   Hyperlipidemia   Current use of long term anticoagulation  Assessment and Plan:   Severe sepsis secondary to UTI/prostatitis/pyelonephritis -Noted to have tachycardia and tachypnea for SIRS criteria on admission along with AKI -Continue Rocephin 2 g daily as ordered -Blood cultures with no growth to date and urine cultures with gram-negative rods with further ID and sensitivities pending -Follow labs and continue gentle IV fluid   AKI versus progressive CKD-improving -Creatinine 1.7 with baseline near 1 -Back to baseline, DC IV fluid   Mild transaminitis -Possibly related to sepsis physiology -Cannot continue to trend -Downtrending, acute hepatitis panel negative -Hold statin for now   Atrial fibrillation/flutter with RVR-improved -Okay for telemetry this morning with heart rates improved -On Eliquis for anticoagulation -Continue verapamil   Hypertension -Continue verapamil -Hold lisinopril and HCTZ, and follow blood pressures   Anxiety disorder -Can continue home medications   Morbid obesity -BMI  44.31 -Lifestyle changes outpatient   OSA -CPAP at night     DVT prophylaxis: Eliquis Code Status: Full Family Communication: Discussed with wife at bedside 8/11 Disposition Plan:  Status is: Inpatient Remains inpatient appropriate because: Need for ongoing IV antibiotics     Consultants:  None   Procedures:  None   Antimicrobials:  Anti-infectives (From admission, onward)    Start     Dose/Rate Route Frequency Ordered Stop   04/04/22 1400  cefTRIAXone (ROCEPHIN) 2 g in sodium chloride 0.9 % 100 mL IVPB        2 g 200 mL/hr over 30 Minutes Intravenous Every 24 hours 04/03/22 1529     04/03/22 1400  cefTRIAXone (ROCEPHIN) 2 g in sodium chloride 0.9 % 100 mL IVPB        2 g 200 mL/hr over 30 Minutes Intravenous  Once 04/03/22 1354 04/03/22 1451       Subjective: Patient seen and evaluated today with no new acute complaints or concerns. No acute concerns or events noted overnight.  Objective: Vitals:   04/04/22 2138 04/04/22 2140 04/05/22 0515 04/05/22 0546  BP: (!) 83/42 (!) 116/59 (!) 149/125 125/85  Pulse: 77 72 (!) 56 (!) 58  Resp: 20  20   Temp: 98.5 F (36.9 C)  98 F (36.7 C)   TempSrc: Oral  Oral   SpO2: 94%  98%   Weight:      Height:        Intake/Output Summary (Last 24 hours) at 04/05/2022 1027 Last data filed at 04/05/2022 0700 Gross per 24 hour  Intake 165.61 ml  Output 600 ml  Net -434.39 ml   Filed Weights   04/03/22 1129 04/04/22  1245  Weight: (!) 165.1 kg (!) 159.4 kg    Examination:  General exam: Appears calm and comfortable, obese Respiratory system: Clear to auscultation. Respiratory effort normal. Cardiovascular system: S1 & S2 heard, RRR.  Gastrointestinal system: Abdomen is soft Central nervous system: Alert and awake Extremities: No edema Skin: No significant lesions noted Psychiatry: Flat affect.    Data Reviewed: I have personally reviewed following labs and imaging studies  CBC: Recent Labs  Lab 04/03/22 1222  04/04/22 0451 04/05/22 0608  WBC 7.9 5.8 5.7  NEUTROABS 5.5  --   --   HGB 15.9 15.4 13.9  HCT 49.1 48.0 43.7  MCV 81.2 82.6 82.9  PLT 216 185 161   Basic Metabolic Panel: Recent Labs  Lab 04/03/22 1222 04/04/22 0451 04/05/22 0608  NA 138 141 142  K 3.4* 3.3* 4.0  CL 108 111 110  CO2 '22 24 28  '$ GLUCOSE 115* 108* 96  BUN 21 24* 18  CREATININE 1.74* 1.38* 1.03  CALCIUM 8.4* 8.4* 8.3*  MG  --  2.3 2.3   GFR: Estimated Creatinine Clearance: 103.1 mL/min (by C-G formula based on SCr of 1.03 mg/dL). Liver Function Tests: Recent Labs  Lab 04/03/22 1222 04/04/22 0451 04/05/22 0608  AST 108* 153* 87*  ALT 62* 90* 74*  ALKPHOS 73 68 63  BILITOT 1.3* 1.0 0.7  PROT 7.0 6.6 6.2*  ALBUMIN 3.1* 2.8* 2.6*   No results for input(s): "LIPASE", "AMYLASE" in the last 168 hours. No results for input(s): "AMMONIA" in the last 168 hours. Coagulation Profile: No results for input(s): "INR", "PROTIME" in the last 168 hours. Cardiac Enzymes: No results for input(s): "CKTOTAL", "CKMB", "CKMBINDEX", "TROPONINI" in the last 168 hours. BNP (last 3 results) No results for input(s): "PROBNP" in the last 8760 hours. HbA1C: No results for input(s): "HGBA1C" in the last 72 hours. CBG: Recent Labs  Lab 04/04/22 1808  GLUCAP 90   Lipid Profile: No results for input(s): "CHOL", "HDL", "LDLCALC", "TRIG", "CHOLHDL", "LDLDIRECT" in the last 72 hours. Thyroid Function Tests: No results for input(s): "TSH", "T4TOTAL", "FREET4", "T3FREE", "THYROIDAB" in the last 72 hours. Anemia Panel: No results for input(s): "VITAMINB12", "FOLATE", "FERRITIN", "TIBC", "IRON", "RETICCTPCT" in the last 72 hours. Sepsis Labs: Recent Labs  Lab 04/03/22 1222 04/03/22 1441  LATICACIDVEN 1.6 1.6    Recent Results (from the past 240 hour(s))  Culture, blood (routine x 2)     Status: None (Preliminary result)   Collection Time: 04/03/22 12:13 PM   Specimen: BLOOD  Result Value Ref Range Status   Specimen  Description BLOOD  Final   Special Requests NONE  Final   Culture   Final    NO GROWTH 2 DAYS Performed at [facility], [address], [place], [place] [address]    Report Status PENDING  Incomplete  Culture, blood (routine x 2)     Status: None (Preliminary result)   Collection Time: 04/03/22 12:22 PM   Specimen: Right Antecubital; Blood  Result Value Ref Range Status   Specimen Description   Final    RIGHT ANTECUBITAL BOTTLES DRAWN AEROBIC AND ANAEROBIC   Special Requests Blood Culture adequate volume  Final   Culture   Final    NO GROWTH 2 DAYS Performed at [facility], [address], [place], [place] [address]    Report Status PENDING  Incomplete  Urine Culture     Status: Abnormal (Preliminary result)   Collection Time: 04/03/22  2:09 PM   Specimen: Urine, Clean Catch  Result Value Ref Range Status   Specimen Description   Final    URINE, CLEAN CATCH Performed at [facility], [address], [place], [place] [address]    Special Requests   Final    NONE Performed at [facility], [address], [place], [place] [address]    Culture >=100,000 COLONIES/mL GRAM NEGATIVE RODS (A)  Final   Report Status PENDING  Incomplete         Radiology Studies: DG Chest Port 1 View  Result Date: 04/03/2022 CLINICAL DATA:  Weakness. EXAM: PORTABLE CHEST 1 VIEW COMPARISON:  None Available. FINDINGS: The heart size and mediastinal contours are within normal limits. Both lungs are clear. The visualized skeletal structures are unremarkable. IMPRESSION: No active disease. Electronically Signed   By: Marijo Conception M.D.   On: 04/03/2022 13:34   CT Renal Stone Study  Result Date: 04/03/2022 CLINICAL DATA:  Flank pain, kidney stone suspected EXAM: CT ABDOMEN AND PELVIS WITHOUT CONTRAST TECHNIQUE: Multidetector CT imaging of the abdomen and pelvis was performed following the standard protocol without IV contrast. RADIATION DOSE REDUCTION: This exam was performed according to the  departmental dose-optimization program which includes automated exposure control, adjustment of the mA and/or kV according to patient size and/or use of iterative reconstruction technique. COMPARISON:  CT abdomen pelvis 02/25/2016, MRI abdomen 04/17/2016. FINDINGS: Lower chest: There is a focal airspace consolidation in the medial right lower lobe (series 4, image 25). Additional eight and 6 mm pulmonary nodules in the peripheral right lower lobe (series 2, images 12 and 14). These findings are new since the prior CT in July 2017. Hepatobiliary: No focal liver abnormality is seen. The gallbladder is unremarkable. Pancreas: Unremarkable. No pancreatic ductal dilatation or surrounding inflammatory changes. Spleen: Normal in size without focal abnormality. Adrenals/Urinary Tract: Adrenal glands are unremarkable. There is symmetric perinephric stranding, increased from prior exam in July 2017. No right renal nephrolithiasis. There is a 1.1 cm stone in the inferior pole the left kidney. No hydroureteronephrosis. No ureter stone. There is heterogeneous low-density in the lower pole the left kidney, likely related to previously seen multiple lower pole renal cyst on prior MRI. There are small renal cysts in the lower pole the right kidney also seen on prior MRI. No follow-up imaging is recommended. There is extensive pelvic inflammatory stranding along a decompressed bladder, the prostate, seminal vesicles, and to a much lesser degree the rectum, which demonstrates no significant wall thickening. Stomach/Bowel: The stomach is within normal limits. There is no evidence of bowel obstruction.The appendix is normal. Colonic diverticulosis. No diverticulitis. No bowel wall thickening. Vascular/Lymphatic: Scattered atherosclerosis. No AAA. No lymphadenopathy. Reproductive: Enlarged prostate gland with adjacent pelvic inflammatory stranding. Other: No focal fluid collection. No free air. Small fat containing ventral hernia in the  upper abdomen with a 1.6 cm aperture (series 2, image 40). Small fat containing inguinal hernias, left greater than right. No bowel containing hernia. Musculoskeletal: No acute osseous abnormality. No suspicious osseous lesion. Mild bilateral hip osteoarthritis. Multilevel degenerative changes of the spine. IMPRESSION: Extensive pelvic inflammatory stranding, centered around a decompressed bladder, the prostate, and the seminal vesicles, suggesting cystitis/prostatitis. Recommend correlation with urinalysis. Symmetric perinephric stranding bilaterally, increased from prior exam suggesting potential upper tract infectious involvement, without evidence of obstructive uropathy. Nonobstructive 1.1 cm stone in the inferior pole of the left kidney. Focal airspace consolidation in the medial right lower lobe, which could be atelectasis or infection. Additional 8 mm and 6 mm nodules in the periphery of  the right lower lobe. Recommend follow-up chest CT in 3 months to assess for interval change. Electronically Signed   By: Maurine Simmering M.D.   On: 04/03/2022 13:08        Scheduled Meds:  apixaban  5 mg Oral BID   latanoprost  1 drop Both Eyes QHS   potassium chloride  40 mEq Oral BID   verapamil  180 mg Oral QHS   Continuous Infusions:  cefTRIAXone (ROCEPHIN)  IV 2 g (04/04/22 1317)     LOS: 2 days    Time spent: 35 minutes    Caniyah Murley Darleen Crocker, DO [facility]  If 7PM-7AM, please contact night-coverage [email or web address] 04/05/2022, 10:27 AM

## 2022-04-06 DIAGNOSIS — A419 Sepsis, unspecified organism: Secondary | ICD-10-CM | POA: Diagnosis not present

## 2022-04-06 DIAGNOSIS — R6520 Severe sepsis without septic shock: Secondary | ICD-10-CM | POA: Diagnosis not present

## 2022-04-06 LAB — URINE CULTURE: Culture: >=100000 — AB

## 2022-04-06 MED ORDER — POLYMYXIN B-TRIMETHOPRIM 10000-0.1 UNIT/ML-% OP SOLN: 1.0000 [drp] | OPHTHALMIC | 0 refills | Status: AC

## 2022-04-06 MED ORDER — CEFADROXIL 500 MG PO CAPS: 500.0000 mg | ORAL_CAPSULE | Freq: Two times a day (BID) | ORAL | 0 refills | Status: AC

## 2022-04-06 MED ORDER — NAPHAZOLINE-GLYCERIN 0.012-0.2 % OP SOLN: 1.0000 [drp] | Freq: Four times a day (QID) | OPHTHALMIC | Status: DC | PRN

## 2022-04-06 MED ORDER — POLYMYXIN B-TRIMETHOPRIM 10000-0.1 UNIT/ML-% OP SOLN
1.0000 [drp] | OPHTHALMIC | Status: DC
  Administered 2022-04-06: 1 [drp] via OPHTHALMIC
  Filled 2022-04-06: qty 10

## 2022-04-06 NOTE — Care Management Important Message (Signed)
Important Message  Patient Details  Name: Robert Warren MRN: [id number] Date of Birth: July 01, 1946   Medicare Important Message Given:  Yes     Tommy Medal 04/06/2022, 10:22 AM

## 2022-04-06 NOTE — TOC Transition Note (Signed)
Transition of Care [facility]) - CM/SW Discharge Note   Patient Details  Name: Taiven Greenley MRN: [id number] Date of Birth: 1946/07/20  Transition of Care [facility]) CM/SW Contact:  Iona Beard, [place] Phone Number: 04/06/2022, 10:59 AM   Clinical Narrative:    CSW spoke with pts wife Bethena Roys about interest in [facility] RN. Pt's wife states that she is interested and feels that pt would be as well. Pts wife states they do not have a preference in [facility] agency. CSW to make referral. MD to place [facility] orders. TOC signing off.   Final next level of care: Media Barriers to Discharge: Barriers Resolved   Patient Goals and CMS Choice Patient states their goals for this hospitalization and ongoing recovery are:: Home with [facility] RN CMS Medicare.gov Compare Post Acute Care list provided to:: Patient Represenative (must comment) Choice offered to / list presented to : Spouse  Discharge Placement                       Discharge Plan and Services                          HH Arranged: RN          Social Determinants of Health (SDOH) Interventions     Readmission Risk Interventions     No data to display

## 2022-04-06 NOTE — Discharge Summary (Signed)
Physician Discharge Summary  Robert Warren OEV:[id number] DOB: February 10, 1946 DOA: 04/03/2022  PCP: Robert Chroman, MD  Admit date: 04/03/2022  Discharge date: 04/06/2022  Admitted From:Home  Disposition:  Home  Recommendations for Outpatient Follow-up:  Follow up with PCP in 1-2 weeks Continue on Duricef as prescribed for 18 more days for total 21-day course of treatment for UTI with associated pyelonephritis and prostatitis Follow-up with urology outpatient regarding noted nephrolithiasis with associated UTI Continue eyedrops for right eye conjunctivitis for total 7 days as prescribed Continue other home medications as prior  Home Health: None  Equipment/Devices: None  Discharge Condition:Stable  CODE STATUS: Full  Diet recommendation: Heart Healthy  Brief/Interim Summary: Robert Warren is a 76 y.o. male with medical history significant for morbid obesity, anxiety, atrial fibrillation, hypertension, dyslipidemia, and sleep apnea who presented to the ED from his PCP office on account of worsening abdominal and back pain.  He apparently began having increasing urinary frequency and dysuria as well.  He was admitted with severe sepsis secondary to UTI with prostatitis as well as suspicion of pyelonephritis.  His urine cultures returned with E. coli that is sensitive to [facility] as noted above.  He had received 3-day course of IV Rocephin with improvement in his overall symptoms noted.  He was noted to have developed some right eye bacterial conjunctivitis on day of discharge and will be discharged on eyedrops as prescribed.  No other acute events noted and he will need outpatient urology follow-up given that he does have some nephrolithiasis and was noted to have prostatitis.  Discharge Diagnoses:  Principal Problem:   Severe sepsis ([place]) Active Problems:   Atrial fibrillation with RVR (HCC)   Lower urinary tract infectious disease   BPH (benign prostatic hyperplasia)   Morbid obesity  (HCC)   OSA (obstructive sleep apnea)   Hyperlipidemia   Current use of long term anticoagulation  Principal discharge diagnosis: Severe sepsis secondary to E. coli UTI/prostatitis/pyelonephritis with AKI-resolved.  Discharge Instructions  Discharge Instructions     Ambulatory referral to Urology   Complete by: As directed    Diet - low sodium heart healthy   Complete by: As directed    Increase activity slowly   Complete by: As directed       Allergies as of 04/06/2022   No Known Allergies      Medication List     STOP taking these medications    hydrochlorothiazide 12.5 MG capsule Commonly known as: MICROZIDE       TAKE these medications    acetaminophen 650 MG CR tablet Commonly known as: TYLENOL Take 650 mg by mouth every 8 (eight) hours as needed for pain.   apixaban 5 MG Tabs tablet Commonly known as: ELIQUIS Take 1 tablet (5 mg total) by mouth 2 (two) times daily.   brimonidine 0.2 % ophthalmic solution Commonly known as: ALPHAGAN Place 1 drop into the right eye 3 (three) times daily.   buPROPion 200 MG 12 hr tablet Commonly known as: WELLBUTRIN SR Take 200 mg by mouth daily as needed (Depression).   cefadroxil 500 MG capsule Commonly known as: DURICEF Take 1 capsule (500 mg total) by mouth 2 (two) times daily for 18 days.   diclofenac Sodium 1 % Gel Commonly known as: VOLTAREN Apply 2 g topically as needed (pain).   latanoprost 0.005 % ophthalmic solution Commonly known as: XALATAN 1 drop at bedtime.   lisinopril 40 MG tablet Commonly known as: ZESTRIL Take 40 mg by mouth daily.  promethazine-dextromethorphan 6.25-15 MG/5ML syrup Commonly known as: PROMETHAZINE-DM Take 5 mLs by mouth 4 (four) times daily as needed.   QUEtiapine 200 MG tablet Commonly known as: SEROQUEL Take 200 mg by mouth daily as needed (anxiety).   rosuvastatin 40 MG tablet Commonly known as: CRESTOR Take 40 mg by mouth daily.   traZODone 100 MG  tablet Commonly known as: DESYREL Take 50 mg by mouth at bedtime as needed for sleep.   trimethoprim-polymyxin Warren ophthalmic solution Commonly known as: POLYTRIM Place 1 drop into the right eye every 4 (four) hours for 7 days.   verapamil 180 MG 24 hr capsule Commonly known as: VERELAN PM Take 1 capsule (180 mg total) by mouth at bedtime.        Follow-up Information     Vyas, Dhruv B, MD. Schedule an appointment as soon as possible for a visit in 1 week(s).   Specialty: Internal Medicine Contact information: [place] [phone]. Go to.   Specialty: Urology Contact information: [address] [place] [phone]               No Known Allergies  Consultations: None   Procedures/Studies: DG Chest Port 1 View  Result Date: 04/03/2022 CLINICAL DATA:  Weakness. EXAM: PORTABLE CHEST 1 VIEW COMPARISON:  None Available. FINDINGS: The heart size and mediastinal contours are within normal limits. Both lungs are clear. The visualized skeletal structures are unremarkable. IMPRESSION: No active disease. Electronically Signed   By: Marijo Conception M.D.   On: 04/03/2022 13:34   CT Renal Stone Study  Result Date: 04/03/2022 CLINICAL DATA:  Flank pain, kidney stone suspected EXAM: CT ABDOMEN AND PELVIS WITHOUT CONTRAST TECHNIQUE: Multidetector CT imaging of the abdomen and pelvis was performed following the standard protocol without IV contrast. RADIATION DOSE REDUCTION: This exam was performed according to the departmental dose-optimization program which includes automated exposure control, adjustment of the mA and/or kV according to patient size and/or use of iterative reconstruction technique. COMPARISON:  CT abdomen pelvis 02/25/2016, MRI abdomen 04/17/2016. FINDINGS: Lower chest: There is a focal airspace consolidation in the medial right lower lobe (series 4, image 25).  Additional eight and 6 mm pulmonary nodules in the peripheral right lower lobe (series 2, images 12 and 14). These findings are new since the prior CT in July 2017. Hepatobiliary: No focal liver abnormality is seen. The gallbladder is unremarkable. Pancreas: Unremarkable. No pancreatic ductal dilatation or surrounding inflammatory changes. Spleen: Normal in size without focal abnormality. Adrenals/Urinary Tract: Adrenal glands are unremarkable. There is symmetric perinephric stranding, increased from prior exam in July 2017. No right renal nephrolithiasis. There is a 1.1 cm stone in the inferior pole the left kidney. No hydroureteronephrosis. No ureter stone. There is heterogeneous low-density in the lower pole the left kidney, likely related to previously seen multiple lower pole renal cyst on prior MRI. There are small renal cysts in the lower pole the right kidney also seen on prior MRI. No follow-up imaging is recommended. There is extensive pelvic inflammatory stranding along a decompressed bladder, the prostate, seminal vesicles, and to a much lesser degree the rectum, which demonstrates no significant wall thickening. Stomach/Bowel: The stomach is within normal limits. There is no evidence of bowel obstruction.The appendix is normal. Colonic diverticulosis. No diverticulitis. No bowel wall thickening. Vascular/Lymphatic: Scattered atherosclerosis. No AAA. No lymphadenopathy. Reproductive: Enlarged prostate gland with adjacent pelvic  inflammatory stranding. Other: No focal fluid collection. No free air. Small fat containing ventral hernia in the upper abdomen with a 1.6 cm aperture (series 2, image 40). Small fat containing inguinal hernias, left greater than right. No bowel containing hernia. Musculoskeletal: No acute osseous abnormality. No suspicious osseous lesion. Mild bilateral hip osteoarthritis. Multilevel degenerative changes of the spine. IMPRESSION: Extensive pelvic inflammatory stranding, centered  around a decompressed bladder, the prostate, and the seminal vesicles, suggesting cystitis/prostatitis. Recommend correlation with urinalysis. Symmetric perinephric stranding bilaterally, increased from prior exam suggesting potential upper tract infectious involvement, without evidence of obstructive uropathy. Nonobstructive 1.1 cm stone in the inferior pole of the left kidney. Focal airspace consolidation in the medial right lower lobe, which could be atelectasis or infection. Additional 8 mm and 6 mm nodules in the periphery of the right lower lobe. Recommend follow-up chest CT in 3 months to assess for interval change. Electronically Signed   By: Maurine Simmering M.D.   On: 04/03/2022 13:08     Discharge Exam: Vitals:   04/05/22 1900 04/06/22 0520  BP: (!) 150/97 110/74  Pulse: (!) 54 61  Resp: 20 16  Temp: 98.5 F (36.9 C) (!) 97.5 F (36.4 C)  SpO2: 94% 98%   Vitals:   04/05/22 0546 04/05/22 1246 04/05/22 1900 04/06/22 0520  BP: 125/85 125/69 (!) 150/97 110/74  Pulse: (!) 58 (!) 41 (!) 54 61  Resp:  '19 20 16  '$ Temp:  98.4 F (36.9 C) 98.5 F (36.9 C) (!) 97.5 F (36.4 C)  TempSrc:  Oral Oral   SpO2:  100% 94% 98%  Weight:      Height:        General: Pt is alert, awake, not in acute distress, obese Cardiovascular: RRR, S1/S2 +, no rubs, no gallops Respiratory: CTA bilaterally, no wheezing, no rhonchi Abdominal: Soft, NT, ND, bowel sounds + Extremities: no edema, no cyanosis    The results of significant diagnostics from this hospitalization (including imaging, microbiology, ancillary and laboratory) are listed below for reference.     Microbiology: Recent Results (from the past 240 hour(s))  Culture, blood (routine x 2)     Status: None (Preliminary result)   Collection Time: 04/03/22 12:13 PM   Specimen: BLOOD  Result Value Ref Range Status   Specimen Description BLOOD  Final   Special Requests NONE  Final   Culture   Final    NO GROWTH 3 DAYS Performed at [facility], [address] [place], [place] [address]    Report Status PENDING  Incomplete  Culture, blood (routine x 2)     Status: None (Preliminary result)   Collection Time: 04/03/22 12:22 PM   Specimen: Right Antecubital; Blood  Result Value Ref Range Status   Specimen Description   Final    RIGHT ANTECUBITAL BOTTLES DRAWN AEROBIC AND ANAEROBIC   Special Requests Blood Culture adequate volume  Final   Culture   Final    NO GROWTH 3 DAYS Performed at [facility], [address], [place], [place] [address]    Report Status PENDING  Incomplete  Urine Culture     Status: Abnormal   Collection Time: 04/03/22  2:09 PM   Specimen: Urine, Clean Catch  Result Value Ref Range Status   Specimen Description   Final    URINE, CLEAN CATCH Performed at [facility], [address] [place], [place] [address]    Special Requests   Final    NONE Performed at [facility], [address], [place], [place] [address]    Culture >=100,000 COLONIES/mL ESCHERICHIA COLI (A)  Final   Report Status 04/06/2022 FINAL  Final   Organism ID, Bacteria ESCHERICHIA COLI (A)  Final      Susceptibility   Escherichia coli - MIC*    AMPICILLIN >=32 RESISTANT Resistant     CEFAZOLIN <=4 SENSITIVE Sensitive     CEFEPIME <=0.12 SENSITIVE Sensitive     CEFTRIAXONE <=0.25 SENSITIVE Sensitive     CIPROFLOXACIN <=0.25 SENSITIVE Sensitive     GENTAMICIN <=1 SENSITIVE Sensitive     IMIPENEM <=0.25 SENSITIVE Sensitive     NITROFURANTOIN <=16 SENSITIVE Sensitive     TRIMETH/SULFA <=20 SENSITIVE Sensitive     AMPICILLIN/SULBACTAM >=32 RESISTANT Resistant     PIP/TAZO <=4 SENSITIVE Sensitive     * >=100,000 COLONIES/mL ESCHERICHIA COLI     Labs: BNP (last 3 results) No results for input(s): "BNP" in the last 8760 hours. Basic Metabolic Panel: Recent Labs  Lab 04/03/22 1222 04/04/22 0451 04/05/22 0608  NA 138 141 142  K 3.4* 3.3* 4.0  CL 108 111 110  CO2 '22 24 28  '$ GLUCOSE 115* 108* 96  BUN 21 24* 18   CREATININE 1.74* 1.38* 1.03  CALCIUM 8.4* 8.4* 8.3*  MG  --  2.3 2.3   Liver Function Tests: Recent Labs  Lab 04/03/22 1222 04/04/22 0451 04/05/22 0608  AST 108* 153* 87*  ALT 62* 90* 74*  ALKPHOS 73 68 63  BILITOT 1.3* 1.0 0.7  PROT 7.0 6.6 6.2*  ALBUMIN 3.1* 2.8* 2.6*   No results for input(s): "LIPASE", "AMYLASE" in the last 168 hours. No results for input(s): "AMMONIA" in the last 168 hours. CBC: Recent Labs  Lab 04/03/22 1222 04/04/22 0451 04/05/22 0608  WBC 7.9 5.8 5.7  NEUTROABS 5.5  --   --   HGB 15.9 15.4 13.9  HCT 49.1 48.0 43.7  MCV 81.2 82.6 82.9  PLT 216 185 197   Cardiac Enzymes: No results for input(s): "CKTOTAL", "CKMB", "CKMBINDEX", "TROPONINI" in the last 168 hours. BNP: Invalid input(s): "POCBNP" CBG: Recent Labs  Lab 04/04/22 1808  GLUCAP 90   D-Dimer No results for input(s): "DDIMER" in the last 72 hours. Hgb A1c No results for input(s): "HGBA1C" in the last 72 hours. Lipid Profile No results for input(s): "CHOL", "HDL", "LDLCALC", "TRIG", "CHOLHDL", "LDLDIRECT" in the last 72 hours. Thyroid function studies No results for input(s): "TSH", "T4TOTAL", "T3FREE", "THYROIDAB" in the last 72 hours.  Invalid input(s): "FREET3" Anemia work up No results for input(s): "VITAMINB12", "FOLATE", "FERRITIN", "TIBC", "IRON", "RETICCTPCT" in the last 72 hours. Urinalysis    Component Value Date/Time   COLORURINE AMBER (A) 04/03/2022 1409   APPEARANCEUR CLOUDY (A) 04/03/2022 1409   LABSPEC 1.025 04/03/2022 1409   PHURINE 5.0 04/03/2022 1409   GLUCOSEU NEGATIVE 04/03/2022 1409   HGBUR LARGE (A) 04/03/2022 1409   BILIRUBINUR NEGATIVE 04/03/2022 1409   KETONESUR NEGATIVE 04/03/2022 1409   PROTEINUR >=300 (A) 04/03/2022 1409   NITRITE NEGATIVE 04/03/2022 1409   LEUKOCYTESUR MODERATE (A) 04/03/2022 1409   Sepsis Labs Recent Labs  Lab 04/03/22 1222 04/04/22 0451 04/05/22 0608  WBC 7.9 5.8 5.7   Microbiology Recent Results (from the past  240 hour(s))  Culture, blood (routine x 2)     Status: None (Preliminary result)   Collection Time: 04/03/22 12:13 PM   Specimen: BLOOD  Result Value Ref Range Status   Specimen Description BLOOD  Final   Special Requests NONE  Final   Culture   Final    NO GROWTH 3 DAYS Performed at [facility], [address] [place], [place] [address]    Report Status PENDING  Incomplete  Culture, blood (routine x 2)     Status: None (Preliminary result)   Collection Time: 04/03/22 12:22 PM   Specimen: Right Antecubital; Blood  Result Value Ref Range Status   Specimen Description   Final    RIGHT ANTECUBITAL BOTTLES DRAWN AEROBIC AND ANAEROBIC   Special Requests Blood Culture adequate volume  Final   Culture   Final    NO GROWTH 3 DAYS Performed at [facility], [address], [place], [place] [address]    Report Status PENDING  Incomplete  Urine Culture     Status: Abnormal   Collection Time: 04/03/22  2:09 PM   Specimen: Urine, Clean Catch  Result Value Ref Range Status   Specimen Description   Final    URINE, CLEAN CATCH Performed at [facility], [address] [place], [place] [address]    Special Requests   Final    NONE Performed at [facility], [address] [place], [place] [address]    Culture >=100,000 COLONIES/mL ESCHERICHIA COLI (A)  Final   Report Status 04/06/2022 FINAL  Final   Organism ID, Bacteria ESCHERICHIA COLI (A)  Final      Susceptibility   Escherichia coli - MIC*    AMPICILLIN >=32 RESISTANT Resistant     CEFAZOLIN <=4 SENSITIVE Sensitive     CEFEPIME <=0.12 SENSITIVE Sensitive     CEFTRIAXONE <=0.25 SENSITIVE Sensitive     CIPROFLOXACIN <=0.25 SENSITIVE Sensitive     GENTAMICIN <=1 SENSITIVE Sensitive     IMIPENEM <=0.25 SENSITIVE Sensitive     NITROFURANTOIN <=16 SENSITIVE Sensitive     TRIMETH/SULFA <=20 SENSITIVE Sensitive     AMPICILLIN/SULBACTAM >=32 RESISTANT Resistant     PIP/TAZO <=4 SENSITIVE Sensitive     * >=100,000 COLONIES/mL ESCHERICHIA  COLI     Time coordinating discharge: 35 minutes  SIGNED:   Rodena Goldmann, DO [facility] 04/06/2022, 10:01 AM  If 7PM-7AM, please contact night-coverage [email or web address]

## 2022-04-06 NOTE — Progress Notes (Signed)
Patient discharged home today, transported home by family. Discharge paperwork went over with patient, patient verbalized understanding. Belongings sent home with patient.  ?

## 2022-04-07 DIAGNOSIS — H109 Unspecified conjunctivitis: Secondary | ICD-10-CM | POA: Diagnosis not present

## 2022-04-07 DIAGNOSIS — N390 Urinary tract infection, site not specified: Secondary | ICD-10-CM | POA: Diagnosis not present

## 2022-04-07 DIAGNOSIS — F419 Anxiety disorder, unspecified: Secondary | ICD-10-CM | POA: Diagnosis not present

## 2022-04-07 DIAGNOSIS — A4151 Sepsis due to Escherichia coli [E. coli]: Secondary | ICD-10-CM | POA: Diagnosis not present

## 2022-04-07 DIAGNOSIS — Z7901 Long term (current) use of anticoagulants: Secondary | ICD-10-CM | POA: Diagnosis not present

## 2022-04-07 DIAGNOSIS — Z299 Encounter for prophylactic measures, unspecified: Secondary | ICD-10-CM | POA: Diagnosis not present

## 2022-04-07 DIAGNOSIS — E785 Hyperlipidemia, unspecified: Secondary | ICD-10-CM | POA: Diagnosis not present

## 2022-04-07 DIAGNOSIS — N12 Tubulo-interstitial nephritis, not specified as acute or chronic: Secondary | ICD-10-CM | POA: Diagnosis not present

## 2022-04-07 DIAGNOSIS — N200 Calculus of kidney: Secondary | ICD-10-CM | POA: Diagnosis not present

## 2022-04-07 DIAGNOSIS — I4891 Unspecified atrial fibrillation: Secondary | ICD-10-CM | POA: Diagnosis not present

## 2022-04-07 DIAGNOSIS — I10 Essential (primary) hypertension: Secondary | ICD-10-CM | POA: Diagnosis not present

## 2022-04-07 DIAGNOSIS — N400 Enlarged prostate without lower urinary tract symptoms: Secondary | ICD-10-CM | POA: Diagnosis not present

## 2022-04-07 DIAGNOSIS — G4733 Obstructive sleep apnea (adult) (pediatric): Secondary | ICD-10-CM | POA: Diagnosis not present

## 2022-04-07 DIAGNOSIS — I4892 Unspecified atrial flutter: Secondary | ICD-10-CM | POA: Diagnosis not present

## 2022-04-07 DIAGNOSIS — Z87891 Personal history of nicotine dependence: Secondary | ICD-10-CM | POA: Diagnosis not present

## 2022-04-07 DIAGNOSIS — N419 Inflammatory disease of prostate, unspecified: Secondary | ICD-10-CM | POA: Diagnosis not present

## 2022-04-07 DIAGNOSIS — Z6841 Body Mass Index (BMI) 40.0 and over, adult: Secondary | ICD-10-CM | POA: Diagnosis not present

## 2022-04-08 ENCOUNTER — Ambulatory Visit: Payer: Medicare Other | Admitting: Physician Assistant

## 2022-04-08 LAB — CULTURE, BLOOD (ROUTINE X 2)
Culture: NO GROWTH
Culture: NO GROWTH
Special Requests: ADEQUATE

## 2022-04-10 DIAGNOSIS — A4151 Sepsis due to Escherichia coli [E. coli]: Secondary | ICD-10-CM | POA: Diagnosis not present

## 2022-04-10 DIAGNOSIS — I4892 Unspecified atrial flutter: Secondary | ICD-10-CM | POA: Diagnosis not present

## 2022-04-10 DIAGNOSIS — N390 Urinary tract infection, site not specified: Secondary | ICD-10-CM | POA: Diagnosis not present

## 2022-04-10 DIAGNOSIS — I4891 Unspecified atrial fibrillation: Secondary | ICD-10-CM | POA: Diagnosis not present

## 2022-04-10 DIAGNOSIS — N419 Inflammatory disease of prostate, unspecified: Secondary | ICD-10-CM | POA: Diagnosis not present

## 2022-04-10 DIAGNOSIS — N12 Tubulo-interstitial nephritis, not specified as acute or chronic: Secondary | ICD-10-CM | POA: Diagnosis not present

## 2022-04-13 DIAGNOSIS — H2512 Age-related nuclear cataract, left eye: Secondary | ICD-10-CM | POA: Diagnosis not present

## 2022-04-13 DIAGNOSIS — H25012 Cortical age-related cataract, left eye: Secondary | ICD-10-CM | POA: Diagnosis not present

## 2022-04-14 DIAGNOSIS — I4891 Unspecified atrial fibrillation: Secondary | ICD-10-CM | POA: Diagnosis not present

## 2022-04-14 DIAGNOSIS — M179 Osteoarthritis of knee, unspecified: Secondary | ICD-10-CM | POA: Diagnosis not present

## 2022-04-14 DIAGNOSIS — Z09 Encounter for follow-up examination after completed treatment for conditions other than malignant neoplasm: Secondary | ICD-10-CM | POA: Diagnosis not present

## 2022-04-14 DIAGNOSIS — I10 Essential (primary) hypertension: Secondary | ICD-10-CM | POA: Diagnosis not present

## 2022-04-14 DIAGNOSIS — N390 Urinary tract infection, site not specified: Secondary | ICD-10-CM | POA: Diagnosis not present

## 2022-04-14 DIAGNOSIS — Z299 Encounter for prophylactic measures, unspecified: Secondary | ICD-10-CM | POA: Diagnosis not present

## 2022-04-15 DIAGNOSIS — I4892 Unspecified atrial flutter: Secondary | ICD-10-CM | POA: Diagnosis not present

## 2022-04-15 DIAGNOSIS — I4891 Unspecified atrial fibrillation: Secondary | ICD-10-CM | POA: Diagnosis not present

## 2022-04-15 DIAGNOSIS — A4151 Sepsis due to Escherichia coli [E. coli]: Secondary | ICD-10-CM | POA: Diagnosis not present

## 2022-04-15 DIAGNOSIS — N390 Urinary tract infection, site not specified: Secondary | ICD-10-CM | POA: Diagnosis not present

## 2022-04-15 DIAGNOSIS — N12 Tubulo-interstitial nephritis, not specified as acute or chronic: Secondary | ICD-10-CM | POA: Diagnosis not present

## 2022-04-15 DIAGNOSIS — N419 Inflammatory disease of prostate, unspecified: Secondary | ICD-10-CM | POA: Diagnosis not present

## 2022-04-22 ENCOUNTER — Ambulatory Visit: Payer: Medicare Other | Admitting: Physician Assistant

## 2022-04-22 DIAGNOSIS — I10 Essential (primary) hypertension: Secondary | ICD-10-CM | POA: Diagnosis not present

## 2022-04-22 DIAGNOSIS — N390 Urinary tract infection, site not specified: Secondary | ICD-10-CM | POA: Diagnosis not present

## 2022-04-22 DIAGNOSIS — I4891 Unspecified atrial fibrillation: Secondary | ICD-10-CM | POA: Diagnosis not present

## 2022-04-22 DIAGNOSIS — N12 Tubulo-interstitial nephritis, not specified as acute or chronic: Secondary | ICD-10-CM | POA: Diagnosis not present

## 2022-04-22 DIAGNOSIS — A4151 Sepsis due to Escherichia coli [E. coli]: Secondary | ICD-10-CM | POA: Diagnosis not present

## 2022-04-22 DIAGNOSIS — I4892 Unspecified atrial flutter: Secondary | ICD-10-CM | POA: Diagnosis not present

## 2022-04-22 DIAGNOSIS — N419 Inflammatory disease of prostate, unspecified: Secondary | ICD-10-CM | POA: Diagnosis not present

## 2022-04-23 ENCOUNTER — Encounter: Payer: Self-pay | Admitting: Urology

## 2022-04-23 ENCOUNTER — Ambulatory Visit (INDEPENDENT_AMBULATORY_CARE_PROVIDER_SITE_OTHER): Payer: Medicare Other | Admitting: Urology

## 2022-04-23 VITALS — BP 149/89 | HR 47 | Wt 350.0 lb

## 2022-04-23 DIAGNOSIS — N200 Calculus of kidney: Secondary | ICD-10-CM | POA: Diagnosis not present

## 2022-04-23 DIAGNOSIS — Z87440 Personal history of urinary (tract) infections: Secondary | ICD-10-CM | POA: Diagnosis not present

## 2022-04-23 DIAGNOSIS — R3129 Other microscopic hematuria: Secondary | ICD-10-CM | POA: Diagnosis not present

## 2022-04-23 DIAGNOSIS — I10 Essential (primary) hypertension: Secondary | ICD-10-CM | POA: Diagnosis not present

## 2022-04-23 DIAGNOSIS — E7800 Pure hypercholesterolemia, unspecified: Secondary | ICD-10-CM | POA: Diagnosis not present

## 2022-04-23 DIAGNOSIS — I4891 Unspecified atrial fibrillation: Secondary | ICD-10-CM | POA: Diagnosis not present

## 2022-04-23 LAB — URINALYSIS, ROUTINE W REFLEX MICROSCOPIC
Bilirubin, UA: NEGATIVE
Glucose, UA: NEGATIVE
Ketones, UA: NEGATIVE
Leukocytes,UA: NEGATIVE
Nitrite, UA: NEGATIVE
Protein,UA: NEGATIVE
Specific Gravity, UA: 1.02 (ref 1.005–1.030)
Urobilinogen, Ur: 4 mg/dL — ABNORMAL HIGH (ref 0.2–1.0)
pH, UA: 5.5 (ref 5.0–7.5)

## 2022-04-23 LAB — MICROSCOPIC EXAMINATION
RBC, Urine: >30 /hpf — AB (ref 0–2)
Renal Epithel, UA: NONE SEEN /hpf
WBC, UA: NONE SEEN /hpf (ref 0–5)

## 2022-04-23 NOTE — Progress Notes (Signed)
Subjective: 1. Kidney stone   2. Personal history of urinary infection   3. Microhematuria      Consult requested by ER  Robert Warren is a 76 yo male who was seen in the ER on 04/03/22 for malaise and anorexia with urgency, frequency and sensation of incomplete emptying.  He was not found to be in retention.  He was found to have pyelonephritis/prostatitis and had some left flank pain.  He had e. Coli on the culture and remains on duricef.  He had a CT that showed an 51m stone in the LLP.  It was 894min 2017.  There was no obstruction.  He had no fever but had a chill.  He has no nausea.  HE had a prior severe UTI in 2017.  He had ureteroscopy for a stone in 12/17 but the prostate was too large to access the ureter and then a [facility] The stone was never treated.  He has been voiding well and his IPSS was 2.  He is on Eliquis for a fib.  ROS:  Review of Systems  Constitutional:        Night sweats  Respiratory:  Positive for shortness of breath.   Musculoskeletal:  Positive for back pain and joint pain.  Neurological:  Positive for dizziness.  Psychiatric/Behavioral:  Positive for depression and memory loss. The patient is nervous/anxious.     No Known Allergies  Past Medical History:  Diagnosis Date   Anxiety    Atrial fibrillation (HCC)    Hypertension    Shortness of breath    Sleep apnea     Past Surgical History:  Procedure Laterality Date   Anal cyst Removal     COLONOSCOPY N/A 06/20/2014   Procedure: COLONOSCOPY;  Surgeon: NaRogene HoustonMD;  [facility];  Service: Endoscopy;  Laterality: N/A;  1030   COLONOSCOPY WITH PROPOFOL N/A 03/14/2021   Procedure: COLONOSCOPY WITH PROPOFOL;  Surgeon: CaHarvel QualeMD;  [facility];  Service: Gastroenterology;  Laterality: N/A;  10:50   CYSTOSCOPY     GREEN LIGHT LASER TURP (TRANSURETHRAL RESECTION OF PROSTATE     POLYPECTOMY  03/14/2021   Procedure:  POLYPECTOMY;  Surgeon: CaMontez MoritaDaQuillian QuinceMD;  [facility];  Service: Gastroenterology;;    Social History   Socioeconomic History   Marital status: Married    Spouse name: Not on file   Number of children: Not on file   Years of education: Not on file   Highest education level: Not on file  Occupational History   Not on file  Tobacco Use   Smoking status: Former    Packs/day: 1.00    Years: 30.00    Total pack years: 30.00    Types: Cigarettes    Quit date: 08/23/1996    Years since quitting: 25.6   Smokeless tobacco: Never  Vaping Use   Vaping Use: Never used  Substance and Sexual Activity   Alcohol use: Yes    Comment: occasional   Drug use: No   Sexual activity: Yes  Other Topics Concern   Not on file  Social History Narrative   Not on file   Social Determinants of Health   Financial Resource Strain: Not on file  Food Insecurity: Not on file  Transportation Needs: Not on file  Physical Activity: Not on file  Stress: Not on file  Social Connections: Not on file  Intimate  Partner Violence: Not on file    Family History  Problem Relation Age of Onset   Heart disease Mother    Cancer - Lung Father    Heart attack Father     Anti-infectives: Anti-infectives (From admission, onward)    None       Current Outpatient Medications  Medication Sig Dispense Refill   acetaminophen (TYLENOL) 650 MG CR tablet Take 650 mg by mouth every 8 (eight) hours as needed for pain.     apixaban (ELIQUIS) 5 MG TABS tablet Take 1 tablet (5 mg total) by mouth 2 (two) times daily. 180 tablet 3   ARIPiprazole (ABILIFY) 15 MG tablet Take 15 mg by mouth daily.     brimonidine (ALPHAGAN) 0.2 % ophthalmic solution Place 1 drop into the right eye 3 (three) times daily.     buPROPion (WELLBUTRIN SR) 200 MG 12 hr tablet Take 200 mg by mouth daily as needed (Depression).     cefadroxil (DURICEF) 500 MG capsule Take 1 capsule (500 mg total) by mouth 2 (two) times  daily for 18 days. 36 capsule 0   diclofenac Sodium (VOLTAREN) 1 % GEL Apply 2 g topically as needed (pain).     latanoprost (XALATAN) 0.005 % ophthalmic solution 1 drop at bedtime.     lisinopril (ZESTRIL) 40 MG tablet Take 40 mg by mouth daily.     promethazine-dextromethorphan (PROMETHAZINE-DM) 6.25-15 MG/5ML syrup Take 5 mLs by mouth 4 (four) times daily as needed. 100 mL 0   QUEtiapine (SEROQUEL) 200 MG tablet Take 200 mg by mouth daily as needed (anxiety).     rosuvastatin (CRESTOR) 40 MG tablet Take 40 mg by mouth daily.     traZODone (DESYREL) 100 MG tablet Take 50 mg by mouth at bedtime as needed for sleep.     verapamil (VERELAN PM) 180 MG 24 hr capsule Take 1 capsule (180 mg total) by mouth at bedtime. 30 capsule 6   No current facility-administered medications for this visit.     Objective: Vital signs in last 24 hours: BP (!) 149/89   Pulse (!) 47   Wt (!) 350 lb (158.8 kg)   BMI 42.60 kg/m   Intake/Output from previous day: No intake/output data recorded. Intake/Output this shift: '@IOTHISSHIFT'$ @   Physical Exam  Lab Results:  Results for orders placed or performed in visit on 04/23/22 (from the past 24 hour(s))  Urinalysis, Routine w reflex microscopic     Status: Abnormal   Collection Time: 04/23/22 11:49 AM  Result Value Ref Range   Specific Gravity, UA 1.020 1.005 - 1.030   pH, UA 5.5 5.0 - 7.5   Color, UA Amber (A) Yellow   Appearance Ur Cloudy (A) Clear   Leukocytes,UA Negative Negative   Protein,UA Negative Negative/Trace   Glucose, UA Negative Negative   Ketones, UA Negative Negative   RBC, UA 3+ (A) Negative   Bilirubin, UA Negative Negative   Urobilinogen, Ur 4.0 (H) 0.2 - 1.0 mg/dL   Nitrite, UA Negative Negative   Microscopic Examination See below:    Narrative   Performed at:  [facility] [address], [place], [place]  [id number] Lab Director: Mina Marble MT, Phone:  [phone]  Microscopic Examination     Status:  Abnormal   Collection Time: 04/23/22 11:49 AM   Urine  Result Value Ref Range   WBC, UA None seen 0 - 5 /hpf   RBC, Urine >30 (A) 0 - 2 /hpf   Epithelial  Cells (non renal) 0-10 0 - 10 /hpf   Renal Epithel, UA None seen None seen /hpf   Mucus, UA Present Not Estab.   Bacteria, UA Few None seen/Few   Narrative   Performed at:  [facility] [address] [place], [place]  [id number] Lab Director: [place], Phone:  [phone]    BMET No results for input(s): "NA", "K", "CL", "CO2", "GLUCOSE", "BUN", "CREATININE", "CALCIUM" in the last 72 hours. PT/INR No results for input(s): "LABPROT", "INR" in the last 72 hours. ABG No results for input(s): "PHART", "HCO3" in the last 72 hours.  Invalid input(s): "PCO2", "PO2"  Studies/Results: No results found. DG Chest Port 1 View  Result Date: 04/03/2022 CLINICAL DATA:  Weakness. EXAM: PORTABLE CHEST 1 VIEW COMPARISON:  None Available. FINDINGS: The heart size and mediastinal contours are within normal limits. Both lungs are clear. The visualized skeletal structures are unremarkable. IMPRESSION: No active disease. Electronically Signed   By: Marijo Conception M.D.   On: 04/03/2022 13:34   CT Renal Stone Study  Result Date: 04/03/2022 CLINICAL DATA:  Flank pain, kidney stone suspected EXAM: CT ABDOMEN AND PELVIS WITHOUT CONTRAST TECHNIQUE: Multidetector CT imaging of the abdomen and pelvis was performed following the standard protocol without IV contrast. RADIATION DOSE REDUCTION: This exam was performed according to the departmental dose-optimization program which includes automated exposure control, adjustment of the mA and/or kV according to patient size and/or use of iterative reconstruction technique. COMPARISON:  CT abdomen pelvis 02/25/2016, MRI abdomen 04/17/2016. FINDINGS: Lower chest: There is a focal airspace consolidation in the medial right lower lobe (series 4, image 25). Additional eight and 6 mm pulmonary nodules  in the peripheral right lower lobe (series 2, images 12 and 14). These findings are new since the prior CT in July 2017. Hepatobiliary: No focal liver abnormality is seen. The gallbladder is unremarkable. Pancreas: Unremarkable. No pancreatic ductal dilatation or surrounding inflammatory changes. Spleen: Normal in size without focal abnormality. Adrenals/Urinary Tract: Adrenal glands are unremarkable. There is symmetric perinephric stranding, increased from prior exam in July 2017. No right renal nephrolithiasis. There is a 1.1 cm stone in the inferior pole the left kidney. No hydroureteronephrosis. No ureter stone. There is heterogeneous low-density in the lower pole the left kidney, likely related to previously seen multiple lower pole renal cyst on prior MRI. There are small renal cysts in the lower pole the right kidney also seen on prior MRI. No follow-up imaging is recommended. There is extensive pelvic inflammatory stranding along a decompressed bladder, the prostate, seminal vesicles, and to a much lesser degree the rectum, which demonstrates no significant wall thickening. Stomach/Bowel: The stomach is within normal limits. There is no evidence of bowel obstruction.The appendix is normal. Colonic diverticulosis. No diverticulitis. No bowel wall thickening. Vascular/Lymphatic: Scattered atherosclerosis. No AAA. No lymphadenopathy. Reproductive: Enlarged prostate gland with adjacent pelvic inflammatory stranding. Other: No focal fluid collection. No free air. Small fat containing ventral hernia in the upper abdomen with a 1.6 cm aperture (series 2, image 40). Small fat containing inguinal hernias, left greater than right. No bowel containing hernia. Musculoskeletal: No acute osseous abnormality. No suspicious osseous lesion. Mild bilateral hip osteoarthritis. Multilevel degenerative changes of the spine. IMPRESSION: Extensive pelvic inflammatory stranding, centered around a decompressed bladder, the prostate,  and the seminal vesicles, suggesting cystitis/prostatitis. Recommend correlation with urinalysis. Symmetric perinephric stranding bilaterally, increased from prior exam suggesting potential upper tract infectious involvement, without evidence of obstructive uropathy. Nonobstructive 1.1 cm stone  in the inferior pole of the left kidney. Focal airspace consolidation in the medial right lower lobe, which could be atelectasis or infection. Additional 8 mm and 6 mm nodules in the periphery of the right lower lobe. Recommend follow-up chest CT in 3 months to assess for interval change. Electronically Signed   By: Maurine Simmering M.D.   On: 04/03/2022 13:08     Assessment/Plan: 15m left renal stone appears larger than in 2017.  It is non-obstructing but he had some pain and has persistent microhematuria and I think the stone needs treatment.  I discussed ESWL and URS and will get him scheduled for ESWL.  Risks reviewed in detail.  He will need cardiac clearance to come off of the Eliquis and will need a renal [place] days post op prior to resuming the med.  History of UTI.   He is doing well on current therapy.  I will repeat a culture  to make sure he has cleared.     No orders of the defined types were placed in this encounter.    Orders Placed This Encounter  Procedures   Urine Culture   Microscopic Examination   Urinalysis, Routine w reflex microscopic     Return for I will get him scheduled for ESWL. .Marland Kitchen   CC: Dr. DJerene Bearsand [facility]       JIrine Seal9/08/2021 [phone]

## 2022-04-24 ENCOUNTER — Telehealth: Payer: Self-pay

## 2022-04-24 NOTE — Telephone Encounter (Signed)
Surgical clearance sent to Dr. Sallyanne Kuster for upcoming ESWL.

## 2022-04-25 LAB — URINE CULTURE

## 2022-04-29 DIAGNOSIS — A4151 Sepsis due to Escherichia coli [E. coli]: Secondary | ICD-10-CM | POA: Diagnosis not present

## 2022-04-30 DIAGNOSIS — N419 Inflammatory disease of prostate, unspecified: Secondary | ICD-10-CM | POA: Diagnosis not present

## 2022-04-30 DIAGNOSIS — N390 Urinary tract infection, site not specified: Secondary | ICD-10-CM | POA: Diagnosis not present

## 2022-04-30 DIAGNOSIS — I4892 Unspecified atrial flutter: Secondary | ICD-10-CM | POA: Diagnosis not present

## 2022-04-30 DIAGNOSIS — I4891 Unspecified atrial fibrillation: Secondary | ICD-10-CM | POA: Diagnosis not present

## 2022-04-30 DIAGNOSIS — N12 Tubulo-interstitial nephritis, not specified as acute or chronic: Secondary | ICD-10-CM | POA: Diagnosis not present

## 2022-04-30 DIAGNOSIS — A4151 Sepsis due to Escherichia coli [E. coli]: Secondary | ICD-10-CM | POA: Diagnosis not present

## 2022-05-04 ENCOUNTER — Telehealth: Payer: Self-pay | Admitting: Cardiovascular Disease

## 2022-05-04 NOTE — Telephone Encounter (Signed)
   [facility] Pre-operative Risk Assessment    Request for surgical clearance:  What type of surgery is being performed?  Lithotripsy    When is this surgery scheduled?  TBD   What type of clearance is required (medical clearance vs. Pharmacy clearance to hold med vs. Both)?  Both   Are there any medications that need to be held prior to surgery and how long? Eliquis, 3 days prior  Practice name and name of physician performing surgery?  [facility]  Dr. Alyson Ingles or a partner depending on when the procedure is scheduled  What is your office phone number? [phone]    7.   What is your office fax number? [phone]  8.   Anesthesia type (None, local, MAC, general) ?  Moderate Conscious Sedation   Zara Council 05/04/2022, 12:30 PM  Estill Bamberg states their initial clearance request was faxed over on 9/05. Please call with updates.

## 2022-05-05 DIAGNOSIS — H25812 Combined forms of age-related cataract, left eye: Secondary | ICD-10-CM | POA: Diagnosis not present

## 2022-05-05 DIAGNOSIS — H2512 Age-related nuclear cataract, left eye: Secondary | ICD-10-CM | POA: Diagnosis not present

## 2022-05-05 DIAGNOSIS — H25012 Cortical age-related cataract, left eye: Secondary | ICD-10-CM | POA: Diagnosis not present

## 2022-05-06 NOTE — Telephone Encounter (Signed)
Patient with diagnosis of atrial fibrillation on Eliquis for anticoagulation.    Procedure: lithotripsy Date of procedure: TBD   CHA2DS2-VASc Score = 2   This indicates a 2.2% annual risk of stroke. The patient's score is based upon: CHF History: 0 HTN History: 1 Diabetes History: 0 Stroke History: 0 Vascular Disease History: 0 Age Score: 1 Gender Score: 0    CrCl 103 Platelet count 197  Per office protocol, patient can hold Eliquis for 3 days prior to procedure.   Patient will not need bridging with Lovenox (enoxaparin) around procedure.  **This guidance is not considered finalized until pre-operative APP has relayed final recommendations.**

## 2022-05-07 DIAGNOSIS — F419 Anxiety disorder, unspecified: Secondary | ICD-10-CM | POA: Diagnosis not present

## 2022-05-07 DIAGNOSIS — N390 Urinary tract infection, site not specified: Secondary | ICD-10-CM | POA: Diagnosis not present

## 2022-05-07 DIAGNOSIS — A4151 Sepsis due to Escherichia coli [E. coli]: Secondary | ICD-10-CM | POA: Diagnosis not present

## 2022-05-07 DIAGNOSIS — E785 Hyperlipidemia, unspecified: Secondary | ICD-10-CM | POA: Diagnosis not present

## 2022-05-07 DIAGNOSIS — N200 Calculus of kidney: Secondary | ICD-10-CM | POA: Diagnosis not present

## 2022-05-07 DIAGNOSIS — Z87891 Personal history of nicotine dependence: Secondary | ICD-10-CM | POA: Diagnosis not present

## 2022-05-07 DIAGNOSIS — I4892 Unspecified atrial flutter: Secondary | ICD-10-CM | POA: Diagnosis not present

## 2022-05-07 DIAGNOSIS — H109 Unspecified conjunctivitis: Secondary | ICD-10-CM | POA: Diagnosis not present

## 2022-05-07 DIAGNOSIS — I4891 Unspecified atrial fibrillation: Secondary | ICD-10-CM | POA: Diagnosis not present

## 2022-05-07 DIAGNOSIS — G4733 Obstructive sleep apnea (adult) (pediatric): Secondary | ICD-10-CM | POA: Diagnosis not present

## 2022-05-07 DIAGNOSIS — Z6841 Body Mass Index (BMI) 40.0 and over, adult: Secondary | ICD-10-CM | POA: Diagnosis not present

## 2022-05-07 DIAGNOSIS — M25461 Effusion, right knee: Secondary | ICD-10-CM | POA: Diagnosis not present

## 2022-05-07 DIAGNOSIS — N12 Tubulo-interstitial nephritis, not specified as acute or chronic: Secondary | ICD-10-CM | POA: Diagnosis not present

## 2022-05-07 DIAGNOSIS — Z7901 Long term (current) use of anticoagulants: Secondary | ICD-10-CM | POA: Diagnosis not present

## 2022-05-07 DIAGNOSIS — I10 Essential (primary) hypertension: Secondary | ICD-10-CM | POA: Diagnosis not present

## 2022-05-07 DIAGNOSIS — N400 Enlarged prostate without lower urinary tract symptoms: Secondary | ICD-10-CM | POA: Diagnosis not present

## 2022-05-07 DIAGNOSIS — M170 Bilateral primary osteoarthritis of knee: Secondary | ICD-10-CM | POA: Diagnosis not present

## 2022-05-07 DIAGNOSIS — N419 Inflammatory disease of prostate, unspecified: Secondary | ICD-10-CM | POA: Diagnosis not present

## 2022-05-07 NOTE — Telephone Encounter (Signed)
Pt is scheduled for In-office preop clearance on 05/12/22.

## 2022-05-07 NOTE — Telephone Encounter (Signed)
   Name: [name]  DOB: 09-Feb-1946  MRN: [id number]  Primary Cardiologist: Sanda Klein, MD  Chart reviewed as part of pre-operative protocol coverage. Because of Scott Vanderveer past medical history and time since last visit, he will require a follow-up in-office visit in order to better assess preoperative cardiovascular risk.  Last Gen cards OV 09/2021 and OSA f/u 10/2021, both recommending 6 month follow-up therefore recommend to arrange.  Pre-op covering staff: - Please schedule appointment and call patient to inform them. If patient already had an upcoming appointment within acceptable timeframe, please add "pre-op clearance" to the appointment notes so provider is aware. - Please contact requesting surgeon's office via preferred method (i.e, phone, fax) to inform them of need for appointment prior to surgery.  Anticoag already addressed.  Charlie Pitter, PA-C  05/07/2022, 11:54 AM

## 2022-05-10 NOTE — Progress Notes (Deleted)
Cardiology Office Note:    Date:  05/10/2022   ID:  Robert Warren, DOB Nov 12, 1945, MRN [id number]  PCP:  Glenda Chroman, MD   [facility] Prov[facility] Cardiologist:  Sanda Klein, MD { Click to update primary MD,subspecialty MD or APP then REFRESH:1}    Referring MD: Glenda Chroman, MD   No chief complaint on file. ***  History of Present Illness:    Robert Warren is a 76 y.o. male with a hx of ***  Past Medical History:  Diagnosis Date   Anxiety    Atrial fibrillation (HCC)    Hypertension    Shortness of breath    Sleep apnea     Past Surgical History:  Procedure Laterality Date   Anal cyst Removal     COLONOSCOPY N/A 06/20/2014   Procedure: COLONOSCOPY;  Surgeon: Rogene Houston, MD;  [facility];  Service: Endoscopy;  Laterality: N/A;  1030   COLONOSCOPY WITH PROPOFOL N/A 03/14/2021   Procedure: COLONOSCOPY WITH PROPOFOL;  Surgeon: Harvel Quale, MD;  [facility];  Service: Gastroenterology;  Laterality: N/A;  10:50   CYSTOSCOPY     GREEN LIGHT LASER TURP (TRANSURETHRAL RESECTION OF PROSTATE     POLYPECTOMY  03/14/2021   Procedure: POLYPECTOMY;  Surgeon: Harvel Quale, MD;  [facility];  Service: Gastroenterology;;    Current Medications: No outpatient medications have been marked as taking for the 05/12/22 encounter (Appointment) with Ledora Bottcher, [place].     Allergies:   Patient has no known allergies.   Social History   Socioeconomic History   Marital status: Married    Spouse name: Not on file   Number of children: Not on file   Years of education: Not on file   Highest education level: Not on file  Occupational History   Not on file  Tobacco Use   Smoking status: Former    Packs/day: 1.00    Years: 30.00    Total pack years: 30.00    Types: Cigarettes    Quit date: 08/23/1996    Years since quitting: 25.7   Smokeless tobacco: Never  Vaping Use   Vaping Use: Never used   Substance and Sexual Activity   Alcohol use: Yes    Comment: occasional   Drug use: No   Sexual activity: Yes  Other Topics Concern   Not on file  Social History Narrative   Not on file   Social Determinants of Health   Financial Resource Strain: Not on file  Food Insecurity: Not on file  Transportation Needs: Not on file  Physical Activity: Not on file  Stress: Not on file  Social Connections: Not on file     Family History: The patient's ***family history includes Cancer - Lung in his father; Heart attack in his father; Heart disease in his mother.  ROS:   Please see the history of present illness.    *** All other systems reviewed and are negative.  EKGs/Labs/Other Studies Reviewed:    The following studies were reviewed today: ***  EKG:  EKG is *** ordered today.  The ekg ordered today demonstrates ***  Recent Labs: 04/05/2022: ALT 74; BUN 18; Creatinine, Ser 1.03; Hemoglobin 13.9; Magnesium 2.3; Platelets 197; Potassium 4.0; Sodium 142  Recent Lipid Panel No results found for: "CHOL", "TRIG", "HDL", "CHOLHDL", "VLDL", "LDLCALC", "LDLDIRECT"   Risk Assessment/Calculations:   {Does this patient have ATRIAL FIBRILLATION?:[phone]}  No BP recorded.  {Refresh Note OR  Click here to enter BP  :1}***         Physical Exam:    VS:  There were no vitals taken for this visit.    Wt Readings from Last 3 Encounters:  04/23/22 (!) 350 lb (158.8 kg)  04/04/22 (!) 351 lb 8 oz (159.4 kg)  10/29/21 (!) 370 lb 3.2 oz (167.9 kg)     GEN: *** Well nourished, well developed in no acute distress HEENT: Normal NECK: No JVD; No carotid bruits LYMPHATICS: No lymphadenopathy CARDIAC: ***RRR, no murmurs, rubs, gallops RESPIRATORY:  Clear to auscultation without rales, wheezing or rhonchi  ABDOMEN: Soft, non-tender, non-distended MUSCULOSKELETAL:  No edema; No deformity  SKIN: Warm and dry NEUROLOGIC:  Alert and oriented x 3 PSYCHIATRIC:  Normal affect   ASSESSMENT:     No diagnosis found. PLAN:    In order of problems listed above:  ***      {Are you ordering a CV Procedure (e.g. stress test, cath, DCCV, TEE, etc)?   Press F2        :[id number]}    Medication Adjustments/Labs and Tests Ordered: Current medicines are reviewed at length with the patient today.  Concerns regarding medicines are outlined above.  No orders of the defined types were placed in this encounter.  No orders of the defined types were placed in this encounter.   There are no Patient Instructions on file for this visit.   Signed, Ledora Bottcher, [place]  05/10/2022 7:15 PM    [facility]

## 2022-05-12 ENCOUNTER — Ambulatory Visit: Payer: Medicare Other | Admitting: Physician Assistant

## 2022-05-13 DIAGNOSIS — I4892 Unspecified atrial flutter: Secondary | ICD-10-CM | POA: Diagnosis not present

## 2022-05-13 DIAGNOSIS — N12 Tubulo-interstitial nephritis, not specified as acute or chronic: Secondary | ICD-10-CM | POA: Diagnosis not present

## 2022-05-13 DIAGNOSIS — N419 Inflammatory disease of prostate, unspecified: Secondary | ICD-10-CM | POA: Diagnosis not present

## 2022-05-13 DIAGNOSIS — I4891 Unspecified atrial fibrillation: Secondary | ICD-10-CM | POA: Diagnosis not present

## 2022-05-13 DIAGNOSIS — N390 Urinary tract infection, site not specified: Secondary | ICD-10-CM | POA: Diagnosis not present

## 2022-05-13 DIAGNOSIS — A4151 Sepsis due to Escherichia coli [E. coli]: Secondary | ICD-10-CM | POA: Diagnosis not present

## 2022-05-18 NOTE — Telephone Encounter (Signed)
Cardiology appt 10/09 for pre op clearance pending.

## 2022-05-22 DIAGNOSIS — I10 Essential (primary) hypertension: Secondary | ICD-10-CM | POA: Diagnosis not present

## 2022-05-27 DIAGNOSIS — N12 Tubulo-interstitial nephritis, not specified as acute or chronic: Secondary | ICD-10-CM | POA: Diagnosis not present

## 2022-05-27 DIAGNOSIS — N390 Urinary tract infection, site not specified: Secondary | ICD-10-CM | POA: Diagnosis not present

## 2022-05-27 DIAGNOSIS — N419 Inflammatory disease of prostate, unspecified: Secondary | ICD-10-CM | POA: Diagnosis not present

## 2022-05-27 DIAGNOSIS — A4151 Sepsis due to Escherichia coli [E. coli]: Secondary | ICD-10-CM | POA: Diagnosis not present

## 2022-05-27 DIAGNOSIS — I4891 Unspecified atrial fibrillation: Secondary | ICD-10-CM | POA: Diagnosis not present

## 2022-05-27 DIAGNOSIS — I4892 Unspecified atrial flutter: Secondary | ICD-10-CM | POA: Diagnosis not present

## 2022-05-31 NOTE — Progress Notes (Addendum)
Cardiology Office Note:    Date:  06/01/2022   ID:  Robert Warren, DOB 09-06-1945, MRN [id number]  PCP:  Glenda Chroman, MD   [facility] Cardiologist:  Sanda Klein, MD     Referring MD: Glenda Chroman, MD   CC: Preoperative Cardiovascular Risk Assessment and Shortness of Breath  History of Present Illness:    Robert Warren is a 76 y.o. male with a hx of PAF on Eliquis, bradycardia, hypertension, hyperlipidemia, OSA on CPAP, and morbid obesity.  He was initially diagnosed with A-fib during an episode of urosepsis in 2017.  He is anticoagulated with Eliquis.  Echocardiogram at that time showed an LVEF 55-60%, moderately reduced RV function and no significant valvular disease.  Lexiscan Myoview 03/2016 was nonischemic.  He has a history of junctional bradycardia but has not required a PPM.  He was last seen in clinic 10/29/2021 and was doing well at that time.  His dose of verapamil had increased from 120 mg to 180 mg at the [place].  He is also maintained on 40 mg lisinopril and 12.5 mg HCTZ.  He was recently admitted 03/2022 with abdominal and back pain found to have severe sepsis secondary to UTI with prostatitis as well as suspicion for pyelonephritis.  He was treated with IV antibiotics and discharged with outpatient follow-up with urology.  Urology has recommended lithotripsy and we have been asked to evaluate for preoperative risk assessment. His chief complaint today is chronic shortness of breath that he attributes to stress and recent weight gain of 8 lbs since hospital discharge due to poor eating habits, says this has been going on for the past year. Said he also just got over a sinus infection. Denies any fever, chills, N/V. Denies any chest pain, palpitations, syncope, presycnope, dizziness, orthopnea, PND, claudication, or bleeding. Denies any other questions or concerns today.    Past Medical History:  Diagnosis Date   Anxiety    Atrial fibrillation ([place])     Hypertension    Shortness of breath    Sleep apnea     Past Surgical History:  Procedure Laterality Date   Anal cyst Removal     COLONOSCOPY N/A 06/20/2014   Procedure: COLONOSCOPY;  Surgeon: Rogene Houston, MD;  Location: [facility];  Service: Endoscopy;  Laterality: N/A;  1030   COLONOSCOPY WITH PROPOFOL N/A 03/14/2021   Procedure: COLONOSCOPY WITH PROPOFOL;  Surgeon: Harvel Quale, MD;  Location: [facility];  Service: Gastroenterology;  Laterality: N/A;  10:50   CYSTOSCOPY     GREEN LIGHT LASER TURP (TRANSURETHRAL RESECTION OF PROSTATE     POLYPECTOMY  03/14/2021   Procedure: POLYPECTOMY;  Surgeon: Montez Morita, Quillian Quince, MD;  Location: [facility];  Service: Gastroenterology;;    Current Medications: Current Meds  Medication Sig   acetaminophen (TYLENOL) 650 MG CR tablet Take 650 mg by mouth every 8 (eight) hours as needed for pain.   apixaban (ELIQUIS) 5 MG TABS tablet Take 1 tablet (5 mg total) by mouth 2 (two) times daily.   ARIPiprazole (ABILIFY) 15 MG tablet Take 15 mg by mouth daily.   buPROPion (WELLBUTRIN SR) 200 MG 12 hr tablet Take 200 mg by mouth daily as needed (Depression).   diclofenac Sodium (VOLTAREN) 1 % GEL Apply 2 g topically as needed (pain).   latanoprost (XALATAN) 0.005 % ophthalmic solution 1 drop at bedtime.   lisinopril (ZESTRIL) 40 MG tablet Take 40 mg by mouth daily.   meclizine (ANTIVERT)  25 MG tablet Take 25 mg by mouth 3 (three) times daily as needed.   QUEtiapine (SEROQUEL) 200 MG tablet Take 200 mg by mouth daily as needed (anxiety).   rosuvastatin (CRESTOR) 40 MG tablet Take 40 mg by mouth daily.   traZODone (DESYREL) 100 MG tablet Take 50 mg by mouth at bedtime as needed for sleep.   verapamil (VERELAN PM) 180 MG 24 hr capsule Take 1 capsule (180 mg total) by mouth at bedtime.     Allergies:   Patient has no known allergies.   Social History   Socioeconomic History   Marital status: Married    Spouse name: Not on  file   Number of children: Not on file   Years of education: Not on file   Highest education level: Not on file  Occupational History   Not on file  Tobacco Use   Smoking status: Former    Packs/day: 1.00    Years: 30.00    Total pack years: 30.00    Types: Cigarettes    Quit date: 08/23/1996    Years since quitting: 25.7   Smokeless tobacco: Never  Vaping Use   Vaping Use: Never used  Substance and Sexual Activity   Alcohol use: Yes    Comment: occasional   Drug use: No   Sexual activity: Yes  Other Topics Concern   Not on file  Social History Narrative   Not on file   Social Determinants of Health   Financial Resource Strain: Not on file  Food Insecurity: Not on file  Transportation Needs: Not on file  Physical Activity: Not on file  Stress: Not on file  Social Connections: Not on file     Family History: The patient's family history includes Cancer - Lung in his father; Heart attack in his father; Heart disease in his mother.  ROS:   Review of Systems  Constitutional: Negative.   HENT: Negative.    Eyes: Negative.   Respiratory:  Positive for shortness of breath. Negative for cough, hemoptysis, sputum production and wheezing.   Cardiovascular: Negative.   Gastrointestinal: Negative.   Musculoskeletal:  Positive for joint pain. Negative for back pain, falls, myalgias and neck pain.       Chronic knee pain (OA).   Skin: Negative.   Neurological: Negative.   Endo/Heme/Allergies: Negative.   Psychiatric/Behavioral:  Negative for depression, hallucinations, memory loss, substance abuse and suicidal ideas. The patient is nervous/anxious. The patient does not have insomnia.        Admits to stress.     Please see the history of present illness.    All other systems reviewed and are negative.  EKGs/Labs/Other Studies Reviewed:    The following studies were reviewed today:   EKG:  EKG is ordered today.  The ekg ordered today demonstrates SR with PVC's in  pattern of bigeminy. Nonspecific ST/T wave abnormality, prolonged QT (seems to be related to PVC's in bigeminy pattern), otherwise nothing acute.    Recent Labs: 04/05/2022: ALT 74; BUN 18; Creatinine, Ser 1.03; Hemoglobin 13.9; Magnesium 2.3; Platelets 197; Potassium 4.0; Sodium 142  Recent Lipid Panel No results found for: "CHOL", "TRIG", "HDL", "CHOLHDL", "VLDL", "LDLCALC", "LDLDIRECT"   Risk Assessment/Calculations:    CHA2DS2-VASc Score = 2   This indicates a 2.2% annual risk of stroke. The patient's score is based upon: CHF History: 0 HTN History: 1 Diabetes History: 0 Stroke History: 0 Vascular Disease History: 0 Age Score: 1 Gender Score: 0  HYPERTENSION CONTROL Vitals:   06/01/22 1533 06/01/22 1550  BP: (!) 160/78 (!) 160/90    The patient's blood pressure is elevated above target today.  In order to address the patient's elevated BP: Blood pressure will be monitored at home to determine if medication changes need to be made.            Physical Exam:    VS:  BP (!) 160/90 (BP Location: Left Arm, Patient Position: Sitting, Cuff Size: Large)   Pulse 64   Ht '6\' 4"'$  (1.93 m)   Wt (!) 359 lb 3.2 oz (162.9 kg)   SpO2 97%   BMI 43.72 kg/m     Wt Readings from Last 3 Encounters:  06/01/22 (!) 359 lb 3.2 oz (162.9 kg)  04/23/22 (!) 350 lb (158.8 kg)  04/04/22 (!) 351 lb 8 oz (159.4 kg)     GEN: Morbidly obese, 76 y.o. African American male in NAD  HEENT: Normal NECK: No JVD; No carotid bruits LYMPHATICS: No lymphadenopathy CARDIAC: S1/S2, Irregular rhtym with early beats detected, no murmurs, rubs, gallops; 2+ peripheral pulses throughout, strong and equal bilaterally RESPIRATORY:  Clear and diminished to auscultation without rales, wheezing or rhonchi  ABDOMEN: Soft, non-tender, distended MUSCULOSKELETAL:  Nonpitting, generalized edema; No deformity  SKIN: Warm and dry NEUROLOGIC:  Alert and oriented x 3 PSYCHIATRIC:  Normal affect   ASSESSMENT:     1. Preoperative cardiovascular examination   2. Shortness of breath   3. Bigeminy   4. Paroxysmal atrial fibrillation (HCC)   5. Essential hypertension   6. Hyperlipidemia, unspecified hyperlipidemia type   7. Morbid obesity ([place])   8. Obstructive sleep apnea syndrome    PLAN:    In order of problems listed above:  Preoperative cardiovascular examination Robert Warren's perioperative risk of a major cardiac event is 0.4% according to the Revised Cardiac Risk Index (RCRI).  Therefore, he is at low risk for perioperative complications.   His functional capacity is good at 8.33 METs according to the Duke Activity Status Index (DASI). Recommendations: The patient requires an echocardiogram before a disposition can be made regarding surgical risk.                Antiplatelet and/or Anticoagulation Recommendations:  Eliquis (Apixaban) can be held for 3 days prior to surgery.  Please resume post op when felt to be safe.  Does not need bridging with Lovenox around procedure.  Once Echo results and Lexiscan results come back WNL, patient can proceed to surgery at an acceptable risk. Will update this note and route this note to the requesting party once results return and are WNL.    Shared Decision Making/Informed Consent The risks [chest pain, shortness of breath, cardiac arrhythmias, dizziness, blood pressure fluctuations, myocardial infarction, stroke/transient ischemic attack, nausea, vomiting, allergic reaction, radiation exposure, metallic taste sensation and life-threatening complications (estimated to be 1 in 10,000)], benefits (risk stratification, diagnosing coronary artery disease, treatment guidance) and alternatives of a nuclear stress test were discussed in detail with Robert Warren and he agrees to proceed.    Dyspnea Chronic over the past year. Etiology most likely stress related or due to morbid obesity. Update TTE at this time. ED precautions discussed.   Bigeminy Noted on  EKG today. Upon further review of patient's chart, bigeminy has not been noted on previous EKGs. Completey asymptomatic with this. If he continues to have bigeminy noted on future EKGs, I have a low threshold to arrange 14 day Zio monitor to assess  for burden. Will obtain BMET, Mag, and TSH, T3, and T4.    PAF Denies any tachycardia or palpitations. EKG notes JR with PVC's in pattern of bigeminy. Was in A-flutter with RVR in [place] on 03/2022. Was given a Cardizem bolus and heart rates improved. He has converted back to SR with hx of JR and bradycardia. Continue Verapamil and Eliquis for anticoagulation. He does not require reduced dosing of Eliquis. Not on any beta blockers.   HTN BP on arrival, 160/78 and BP recheck, 160/90. Says he has not taken all of his BP meds today. SBP 130s at home. Discussed to monitor BP at home at least 2 hours after medications and sitting for 5-10 minutes. Given BP log for home monitoring. He will notify me within 2 weeks via MyChart and if SBP > 130, plan to adjust medication. Low salt diet, heart healthy diet information given. Will obtain BMET today.    HLD NST in 2017 was nonischemic. LDL 98 in 07/2021. Managed by PCP. Continue to follow with PCP.  Morbid obesity Weight loss via diet and exercise encouraged. Discussed the impact being overweight would have on cardiovascular risk.    8. OSA Adherent to CPAP. Continue to follow up with Dr. Claiborne Billings.   9. Disposition: Follow up with Dr. Sallyanne Kuster in 5-6 months or sooner if anything changes.   Medication Adjustments/Labs and Tests Ordered: Current medicines are reviewed at length with the patient today.  Concerns regarding medicines are outlined above.  Orders Placed This Encounter  Procedures   Basic metabolic panel   [id number]   Magnesium   MYOCARDIAL PERFUSION IMAGING   EKG 12-Lead   ECHOCARDIOGRAM COMPLETE   No orders of the defined types were placed in this encounter.   Patient Instructions   Medication Instructions:  Your physician recommends that you continue on your current medications as directed. Please refer to the Current Medication list given to you today.  *If you need a refill on your cardiac medications before your next appointment, please call your pharmacy*   Lab Work: BMET, Magnesium, TSH, T3 AND T4 After ECO. If you have labs (blood work) drawn today and your tests are completely normal, you will receive your results only by: [place] (if you have MyChart) OR A paper copy in the mail If you have any lab test that is abnormal or we need to change your treatment, we will call you to review the results.   Testing/Procedures: Your physician has requested that you have an echocardiogram. Echocardiography is a painless test that uses sound waves to create images of your heart. It provides your doctor with information about the size and shape of your heart and how well your heart's chambers and valves are working. This procedure takes approximately one hour. There are no restrictions for this procedure.   Your physician has requested that you have a lexiscan myoview. For further information please visit [email or web address]. Please follow instruction sheet, as given. This will take place at [address]  How to prepare for your Myocardial Perfusion Test: Do not eat or drink 3 hours prior to your test, except you may have water. Do not consume products containing caffeine (regular or decaffeinated) 12 hours prior to your test. (ex: coffee, chocolate, sodas, tea). Do bring a list of your current medications with you.  If not listed below, you may take your medications as normal. Do wear comfortable clothes (no dresses or overalls) and walking shoes, tennis shoes preferred (No heels  or open toe shoes are allowed). Do NOT wear cologne, perfume, aftershave, or lotions (deodorant is allowed). The test will take approximately 3 to 4 hours to complete If  these instructions are not followed, your test will have to be rescheduled.    Follow-Up: At [facility], you and your health needs are our priority.  As part of our continuing mission to provide you with exceptional heart care, we have created designated Provider Care Teams.  These Care Teams include your primary Cardiologist (physician) and Advanced Practice Providers (APPs -  Physician Assistants and Nurse Practitioners) who all work together to provide you with the care you need, when you need it.   Your next appointment:   5 or 6 month(s)  The format for your next appointment:   In Person  Provider:   Sanda Klein, MD     Others instructions: Your physician has requested that you regularly monitor and record your blood pressure readings at home. Please use the same machine at the same time of day to check your readings and record them to bring to your follow-up visit.       Signed, Finis Bud, NP  06/01/2022 9:07 PM    [place]

## 2022-06-01 ENCOUNTER — Ambulatory Visit: Payer: Medicare Other | Attending: Physician Assistant | Admitting: Nurse Practitioner

## 2022-06-01 ENCOUNTER — Encounter: Payer: Self-pay | Admitting: Physician Assistant

## 2022-06-01 VITALS — BP 160/90 | HR 64 | Ht 76.0 in | Wt 359.2 lb

## 2022-06-01 DIAGNOSIS — R0602 Shortness of breath: Secondary | ICD-10-CM | POA: Insufficient documentation

## 2022-06-01 DIAGNOSIS — I10 Essential (primary) hypertension: Secondary | ICD-10-CM | POA: Insufficient documentation

## 2022-06-01 DIAGNOSIS — I480 Paroxysmal atrial fibrillation: Secondary | ICD-10-CM | POA: Diagnosis not present

## 2022-06-01 DIAGNOSIS — G4733 Obstructive sleep apnea (adult) (pediatric): Secondary | ICD-10-CM | POA: Diagnosis not present

## 2022-06-01 DIAGNOSIS — I498 Other specified cardiac arrhythmias: Secondary | ICD-10-CM | POA: Insufficient documentation

## 2022-06-01 DIAGNOSIS — E785 Hyperlipidemia, unspecified: Secondary | ICD-10-CM | POA: Insufficient documentation

## 2022-06-01 DIAGNOSIS — Z01810 Encounter for preprocedural cardiovascular examination: Secondary | ICD-10-CM | POA: Insufficient documentation

## 2022-06-01 NOTE — Patient Instructions (Addendum)
Medication Instructions:  Your physician recommends that you continue on your current medications as directed. Please refer to the Current Medication list given to you today.  *If you need a refill on your cardiac medications before your next appointment, please call your pharmacy*   Lab Work: BMET, Magnesium, TSH, T3 AND T4 After ECO. If you have labs (blood work) drawn today and your tests are completely normal, you will receive your results only by: [place] (if you have MyChart) OR A paper copy in the mail If you have any lab test that is abnormal or we need to change your treatment, we will call you to review the results.   Testing/Procedures: Your physician has requested that you have an echocardiogram. Echocardiography is a painless test that uses sound waves to create images of your heart. It provides your doctor with information about the size and shape of your heart and how well your heart's chambers and valves are working. This procedure takes approximately one hour. There are no restrictions for this procedure.   Your physician has requested that you have a lexiscan myoview. For further information please visit [email or web address]. Please follow instruction sheet, as given. This will take place at [address]  How to prepare for your Myocardial Perfusion Test: Do not eat or drink 3 hours prior to your test, except you may have water. Do not consume products containing caffeine (regular or decaffeinated) 12 hours prior to your test. (ex: coffee, chocolate, sodas, tea). Do bring a list of your current medications with you.  If not listed below, you may take your medications as normal. Do wear comfortable clothes (no dresses or overalls) and walking shoes, tennis shoes preferred (No heels or open toe shoes are allowed). Do NOT wear cologne, perfume, aftershave, or lotions (deodorant is allowed). The test will take approximately 3 to 4 hours to complete If  these instructions are not followed, your test will have to be rescheduled.    Follow-Up: At [facility], you and your health needs are our priority.  As part of our continuing mission to provide you with exceptional heart care, we have created designated Provider Care Teams.  These Care Teams include your primary Cardiologist (physician) and Advanced Practice Providers (APPs -  Physician Assistants and Nurse Practitioners) who all work together to provide you with the care you need, when you need it.   Your next appointment:   5 or 6 month(s)  The format for your next appointment:   In Person  Provider:   Sanda Klein, MD     Others instructions: Your physician has requested that you regularly monitor and record your blood pressure readings at home. Please use the same machine at the same time of day to check your readings and record them to bring to your follow-up visit.

## 2022-06-03 DIAGNOSIS — I4892 Unspecified atrial flutter: Secondary | ICD-10-CM | POA: Diagnosis not present

## 2022-06-03 DIAGNOSIS — N419 Inflammatory disease of prostate, unspecified: Secondary | ICD-10-CM | POA: Diagnosis not present

## 2022-06-03 DIAGNOSIS — A4151 Sepsis due to Escherichia coli [E. coli]: Secondary | ICD-10-CM | POA: Diagnosis not present

## 2022-06-03 DIAGNOSIS — N12 Tubulo-interstitial nephritis, not specified as acute or chronic: Secondary | ICD-10-CM | POA: Diagnosis not present

## 2022-06-03 DIAGNOSIS — N390 Urinary tract infection, site not specified: Secondary | ICD-10-CM | POA: Diagnosis not present

## 2022-06-03 DIAGNOSIS — I4891 Unspecified atrial fibrillation: Secondary | ICD-10-CM | POA: Diagnosis not present

## 2022-06-04 NOTE — Telephone Encounter (Signed)
Per Cardiology note  "Antiplatelet and/or Anticoagulation Recommendations:   Eliquis (Apixaban) can be held for 3 days prior to surgery.  Please resume post op when felt to be safe.  Does not need bridging with Lovenox around procedure.  Once Echo results and Lexiscan results come back WNL, patient can proceed to surgery at an acceptable risk. Will update this note and route this note to the requesting party once results return and are WNL. "   Will review chart for updated chart once tests are complete to see if patient can proceed.

## 2022-06-09 ENCOUNTER — Ambulatory Visit (HOSPITAL_COMMUNITY)
Admission: RE | Admit: 2022-06-09 | Discharge: 2022-06-09 | Disposition: A | Discharge status: Home / Self Care | Payer: Medicare Other | Source: Ambulatory Visit | Attending: Nurse Practitioner | Admitting: Nurse Practitioner

## 2022-06-09 DIAGNOSIS — R0609 Other forms of dyspnea: Secondary | ICD-10-CM | POA: Diagnosis not present

## 2022-06-09 DIAGNOSIS — I480 Paroxysmal atrial fibrillation: Secondary | ICD-10-CM | POA: Insufficient documentation

## 2022-06-09 DIAGNOSIS — R0602 Shortness of breath: Secondary | ICD-10-CM | POA: Diagnosis not present

## 2022-06-09 DIAGNOSIS — Z23 Encounter for immunization: Secondary | ICD-10-CM | POA: Diagnosis not present

## 2022-06-09 DIAGNOSIS — G4733 Obstructive sleep apnea (adult) (pediatric): Secondary | ICD-10-CM | POA: Diagnosis not present

## 2022-06-09 DIAGNOSIS — I10 Essential (primary) hypertension: Secondary | ICD-10-CM | POA: Insufficient documentation

## 2022-06-09 LAB — ECHOCARDIOGRAM COMPLETE
AR max vel: 2.07 cm2
AV Area VTI: 2.46 cm2
AV Area mean vel: 2.46 cm2
AV Mean grad: 4 mmHg
AV Peak grad: 9.7 mmHg
Ao pk vel: 1.56 m/s
Area-P 1/2: 2.06 cm2
MV VTI: 1.73 cm2
S' Lateral: 4.7 cm

## 2022-06-09 NOTE — Progress Notes (Signed)
*  PRELIMINARY RESULTS* Echocardiogram 2D Echocardiogram has been performed.  Robert Warren 06/09/2022, 11:32 AM

## 2022-06-11 ENCOUNTER — Telehealth (HOSPITAL_COMMUNITY): Payer: Self-pay | Admitting: *Deleted

## 2022-06-11 NOTE — Telephone Encounter (Signed)
Left message on voicemail per DPR in reference to upcoming appointment scheduled on 06/17/22 with detailed instructions given per Myocardial Perfusion Study Information Sheet for the test. LM to arrive 15 minutes early, and that it is imperative to arrive on time for appointment to keep from having the test rescheduled. If you need to cancel or reschedule your appointment, please call the office within 24 hours of your appointment. Failure to do so may result in a cancellation of your appointment, and a $50 no show fee. Phone number given for call back for any questions. Kirstie Peri

## 2022-06-17 ENCOUNTER — Ambulatory Visit (HOSPITAL_COMMUNITY): Payer: Medicare Other | Attending: Cardiology

## 2022-06-17 ENCOUNTER — Ambulatory Visit (HOSPITAL_COMMUNITY): Payer: Medicare Other

## 2022-06-17 DIAGNOSIS — R0602 Shortness of breath: Secondary | ICD-10-CM | POA: Insufficient documentation

## 2022-06-17 DIAGNOSIS — I10 Essential (primary) hypertension: Secondary | ICD-10-CM | POA: Diagnosis not present

## 2022-06-17 DIAGNOSIS — G4733 Obstructive sleep apnea (adult) (pediatric): Secondary | ICD-10-CM | POA: Diagnosis not present

## 2022-06-17 DIAGNOSIS — I480 Paroxysmal atrial fibrillation: Secondary | ICD-10-CM | POA: Diagnosis not present

## 2022-06-17 MED ORDER — REGADENOSON 0.4 MG/5ML IV SOLN
0.4000 mg | Freq: Once | INTRAVENOUS | Status: AC
  Administered 2022-06-17: 0.4 mg via INTRAVENOUS

## 2022-06-17 MED ORDER — TECHNETIUM TC 99M TETROFOSMIN IV KIT
32.8000 | PACK | Freq: Once | INTRAVENOUS | Status: AC | PRN
  Administered 2022-06-17: 32.8 via INTRAVENOUS

## 2022-06-18 ENCOUNTER — Ambulatory Visit (HOSPITAL_COMMUNITY): Payer: Medicare Other | Attending: Nurse Practitioner

## 2022-06-18 LAB — MYOCARDIAL PERFUSION IMAGING
LV dias vol: 186 mL (ref 62–150)
LV sys vol: 112 mL
Nuc Stress EF: 40 %
Peak HR: 68 {beats}/min
Rest HR: 58 {beats}/min
Rest Nuclear Isotope Dose: 30.5 mCi
SDS: 0
SRS: 0
SSS: 0
ST Depression (mm): 0 mm
Stress Nuclear Isotope Dose: 32.8 mCi
TID: 0.86

## 2022-06-18 MED ORDER — TECHNETIUM TC 99M TETROFOSMIN IV KIT
30.3000 | PACK | Freq: Once | INTRAVENOUS | Status: AC | PRN
  Administered 2022-06-18: 30.3 via INTRAVENOUS

## 2022-06-19 DIAGNOSIS — Z299 Encounter for prophylactic measures, unspecified: Secondary | ICD-10-CM | POA: Diagnosis not present

## 2022-06-19 DIAGNOSIS — I10 Essential (primary) hypertension: Secondary | ICD-10-CM | POA: Diagnosis not present

## 2022-06-19 DIAGNOSIS — I4891 Unspecified atrial fibrillation: Secondary | ICD-10-CM | POA: Diagnosis not present

## 2022-06-22 DIAGNOSIS — I10 Essential (primary) hypertension: Secondary | ICD-10-CM | POA: Diagnosis not present

## 2022-07-06 ENCOUNTER — Telehealth: Payer: Self-pay

## 2022-07-06 NOTE — Telephone Encounter (Signed)
Left message to return call to office discuss if wishes to proceed with ESWL.

## 2022-07-06 NOTE — Telephone Encounter (Signed)
-----   Message from Finis Bud, NP sent at 06/30/2022  2:02 PM EST ----- Regarding: RE: pre op clearance Hello Estill Bamberg,   I did an addendum to my note. Ran this by Dr. Sallyanne Kuster who stated patient is at acceptable risk to proceed. I sent my note to the requesting party. He should be good to go.   Thanks!  Kind Regards, Finis Bud, NP  ----- Message ----- From: Dorisann Frames, RN Sent: 06/30/2022  12:17 PM EST To: Finis Bud, NP Subject: pre op clearance                               Velda Shell,  I am following up on the pre op clearance for Mr. Coppens to have his kidney stones treated.  I saw he had an appt on 10/26 for lexascan. I was wanting to know when Mr. Molesworth would be cleared for surgery.  Thanks, Producer, television/film/video

## 2022-07-08 NOTE — Telephone Encounter (Signed)
Please review, Dr. Jeffie Pollock would like to have patient proceed with ESWL.

## 2022-07-08 NOTE — Telephone Encounter (Signed)
Received approval to proceed with ESWL  Message sent to MD to review to proceed with ESWL.

## 2022-07-22 DIAGNOSIS — I10 Essential (primary) hypertension: Secondary | ICD-10-CM | POA: Diagnosis not present

## 2022-07-28 NOTE — Telephone Encounter (Signed)
Left message to return call to discuss future surgery date.

## 2022-07-29 ENCOUNTER — Other Ambulatory Visit: Payer: Self-pay

## 2022-07-29 DIAGNOSIS — N200 Calculus of kidney: Secondary | ICD-10-CM

## 2022-07-29 NOTE — Telephone Encounter (Signed)
I spoke with Robert Warren. We have discussed possible surgery dates and 08/04/2022 was agreed upon by all parties. Patient given information about surgery date, what to expect pre-operatively and post operatively.    We discussed that a pre-op nurse will be calling to set up the pre-op visit that will take place prior to surgery. Informed patient that our office will communicate any additional care to be provided after surgery.    Patients questions or concerns were discussed during our call. Advised to call our office should there be any additional information, questions or concerns that arise. Patient verbalized understanding.

## 2022-07-31 ENCOUNTER — Encounter (HOSPITAL_COMMUNITY)
Admission: RE | Admit: 2022-07-31 | Discharge: 2022-07-31 | Disposition: A | Discharge status: Home / Self Care | Payer: Medicare Other | Source: Ambulatory Visit | Attending: Urology | Admitting: Urology

## 2022-07-31 ENCOUNTER — Encounter (HOSPITAL_COMMUNITY): Payer: Self-pay

## 2022-07-31 ENCOUNTER — Other Ambulatory Visit: Payer: Self-pay

## 2022-08-04 ENCOUNTER — Ambulatory Visit (HOSPITAL_COMMUNITY)
Admission: RE | Admit: 2022-08-04 | Discharge: 2022-08-04 | Disposition: A | Discharge status: Home / Self Care | Payer: Medicare Other | Attending: Urology | Admitting: Urology

## 2022-08-04 ENCOUNTER — Ambulatory Visit (HOSPITAL_COMMUNITY): Payer: Medicare Other

## 2022-08-04 ENCOUNTER — Encounter (HOSPITAL_COMMUNITY)
Admission: RE | Disposition: A | Discharge status: Home / Self Care | Payer: Self-pay | Source: Home / Self Care | Attending: Urology

## 2022-08-04 ENCOUNTER — Encounter (HOSPITAL_COMMUNITY): Payer: Self-pay | Admitting: Urology

## 2022-08-04 DIAGNOSIS — Z87891 Personal history of nicotine dependence: Secondary | ICD-10-CM | POA: Diagnosis not present

## 2022-08-04 DIAGNOSIS — E669 Obesity, unspecified: Secondary | ICD-10-CM | POA: Diagnosis not present

## 2022-08-04 DIAGNOSIS — I10 Essential (primary) hypertension: Secondary | ICD-10-CM | POA: Insufficient documentation

## 2022-08-04 DIAGNOSIS — N200 Calculus of kidney: Secondary | ICD-10-CM | POA: Insufficient documentation

## 2022-08-04 DIAGNOSIS — Z6841 Body Mass Index (BMI) 40.0 and over, adult: Secondary | ICD-10-CM | POA: Diagnosis not present

## 2022-08-04 DIAGNOSIS — Z7901 Long term (current) use of anticoagulants: Secondary | ICD-10-CM | POA: Diagnosis not present

## 2022-08-04 DIAGNOSIS — I4891 Unspecified atrial fibrillation: Secondary | ICD-10-CM | POA: Diagnosis not present

## 2022-08-04 DIAGNOSIS — G4730 Sleep apnea, unspecified: Secondary | ICD-10-CM | POA: Diagnosis not present

## 2022-08-04 HISTORY — PX: EXTRACORPOREAL SHOCK WAVE LITHOTRIPSY: SHX1557

## 2022-08-04 SURGERY — LITHOTRIPSY, ESWL
Anesthesia: LOCAL | Laterality: Left

## 2022-08-04 MED ORDER — ONDANSETRON HCL 4 MG PO TABS: 4.0000 mg | ORAL_TABLET | Freq: Every day | ORAL | 1 refills | Status: DC | PRN

## 2022-08-04 MED ORDER — OXYCODONE HCL 5 MG PO TABS: 5.0000 mg | ORAL_TABLET | Freq: Three times a day (TID) | ORAL | 0 refills | Status: DC | PRN

## 2022-08-04 MED ORDER — DIAZEPAM 5 MG PO TABS
10.0000 mg | ORAL_TABLET | Freq: Once | ORAL | Status: AC
  Administered 2022-08-04: 10 mg via ORAL
  Filled 2022-08-04: qty 2

## 2022-08-04 MED ORDER — DIPHENHYDRAMINE HCL 25 MG PO CAPS
25.0000 mg | ORAL_CAPSULE | ORAL | Status: AC
  Administered 2022-08-04: 25 mg via ORAL
  Filled 2022-08-04: qty 1

## 2022-08-04 MED ORDER — SODIUM CHLORIDE 0.9 % IV SOLN
INTRAVENOUS | Status: DC
  Administered 2022-08-04: 1000 mL via INTRAVENOUS

## 2022-08-04 MED ORDER — TAMSULOSIN HCL 0.4 MG PO CAPS: 0.4000 mg | ORAL_CAPSULE | Freq: Every day | ORAL | 0 refills | Status: DC

## 2022-08-04 NOTE — H&P (Signed)
Urology Admission H&P  Chief Complaint: Left renal stone  History of Present Illness: Robert Warren is a 76yo here for left ESWL for a 1.1cm left renal calculus. He has stopped his eliquis. He has mild left flank pain. No other complaints today  Past Medical History:  Diagnosis Date   Anxiety    Atrial fibrillation ([place])    Hypertension    Shortness of breath    Sleep apnea    Past Surgical History:  Procedure Laterality Date   Anal cyst Removal     COLONOSCOPY N/A 06/20/2014   Procedure: COLONOSCOPY;  Surgeon: Rogene Houston, MD;  [facility];  Service: Endoscopy;  Laterality: N/A;  1030   COLONOSCOPY WITH PROPOFOL N/A 03/14/2021   Procedure: COLONOSCOPY WITH PROPOFOL;  Surgeon: Harvel Quale, MD;  [facility];  Service: Gastroenterology;  Laterality: N/A;  10:50   CYSTOSCOPY     GREEN LIGHT LASER TURP (TRANSURETHRAL RESECTION OF PROSTATE     POLYPECTOMY  03/14/2021   Procedure: POLYPECTOMY;  Surgeon: Harvel Quale, MD;  [facility];  Service: Gastroenterology;;    Home Medications:  Current Facility-Administered Medications  Medication Dose Route Frequency Provider Last Rate Last Admin   0.9 %  sodium chloride infusion   Intravenous Continuous Alyson Ingles Candee Furbish, MD 10 mL/hr at 08/04/22 0700 1,000 mL at 08/04/22 0700   Allergies: No Known Allergies  Family History  Problem Relation Age of Onset   Heart disease Mother    Cancer - Lung Father    Heart attack Father    Social History:  reports that he quit smoking about 25 years ago. His smoking use included cigarettes. He has a 30.00 pack-year smoking history. He has never used smokeless tobacco. He reports current alcohol use. He reports that he does not use drugs.  Review of Systems  Genitourinary:  Positive for flank pain.  All other systems reviewed and are negative.   Physical Exam:  Vital signs in last 24 hours: Temp:  [97.7 F (36.5 C)] 97.7 F (36.5  C) (12/12 0653) Resp:  [20] 20 (12/12 0653) BP: (128)/(76) 128/76 (12/12 0653) SpO2:  [97 %] 97 % (12/12 0653) Weight:  [162.8 kg] 162.8 kg (12/12 2725) Physical Exam Vitals reviewed.  Constitutional:      Appearance: Normal appearance.  HENT:     Head: Normocephalic and atraumatic.     Nose: Nose normal.     Mouth/Throat:     Mouth: Mucous membranes are dry.  Eyes:     Extraocular Movements: Extraocular movements intact.  Cardiovascular:     Rate and Rhythm: Bradycardia present.  Pulmonary:     Effort: Pulmonary effort is normal. No respiratory distress.  Abdominal:     General: Abdomen is flat. There is no distension.  Musculoskeletal:        General: No swelling. Normal range of motion.     Cervical back: Normal range of motion and neck supple.  Skin:    General: Skin is warm and dry.  Neurological:     General: No focal deficit present.     Mental Status: He is alert and oriented to person, place, and time.  Psychiatric:        Mood and Affect: Mood normal.        Behavior: Behavior normal.        Thought Content: Thought content normal.        Judgment: Judgment normal.     Laboratory Data:  No results found for this or any previous visit (from the past 24 hour(s)). No results found for this or any previous visit (from the past 240 hour(s)). Creatinine: No results for input(s): "CREATININE" in the last 168 hours. Baseline Creatinine:   Impression/Assessment:  76yo with left renal calculus  Plan:  -We discussed the management of kidney stones. These options include observation, ureteroscopy, shockwave lithotripsy (ESWL) and percutaneous nephrolithotomy (PCNL). We discussed which options are relevant to the patient's stone(s). We discussed the natural history of kidney stones as well as the complications of untreated stones and the impact on quality of life without treatment as well as with each of the above listed treatments. We also discussed the efficacy of  each treatment in its ability to clear the stone burden. With any of these management options I discussed the signs and symptoms of infection and the need for emergent treatment should these be experienced. For each option we discussed the ability of each procedure to clear the patient of their stone burden.   For observation I described the risks which include but are not limited to silent renal damage, life-threatening infection, need for emergent surgery, failure to pass stone and pain.   For ureteroscopy I described the risks which include bleeding, infection, damage to contiguous structures, positioning injury, ureteral stricture, ureteral avulsion, ureteral injury, need for prolonged ureteral stent, inability to perform ureteroscopy, need for an interval procedure, inability to clear stone burden, stent discomfort/pain, heart attack, stroke, pulmonary embolus and the inherent risks with general anesthesia.   For shockwave lithotripsy I described the risks which include arrhythmia, kidney contusion, kidney hemorrhage, need for transfusion, pain, inability to adequately break up stone, inability to pass stone fragments, Steinstrasse, infection associated with obstructing stones, need for alternate surgical procedure, need for repeat shockwave lithotripsy, MI, CVA, PE and the inherent risks with anesthesia/conscious sedation.   For PCNL I described the risks including positioning injury, pneumothorax, hydrothorax, need for chest tube, inability to clear stone burden, renal laceration, arterial venous fistula or malformation, need for embolization of kidney, loss of kidney or renal function, need for repeat procedure, need for prolonged nephrostomy tube, ureteral avulsion, MI, CVA, PE and the inherent risks of general anesthesia.   - The patient would like to proceed with left ESWL  Nicolette Bang 08/04/2022, 8:48 AM

## 2022-08-05 ENCOUNTER — Encounter (HOSPITAL_COMMUNITY): Payer: Self-pay | Admitting: Urology

## 2022-08-05 DIAGNOSIS — Z1331 Encounter for screening for depression: Secondary | ICD-10-CM | POA: Diagnosis not present

## 2022-08-05 DIAGNOSIS — Z87891 Personal history of nicotine dependence: Secondary | ICD-10-CM | POA: Diagnosis not present

## 2022-08-05 DIAGNOSIS — R5383 Other fatigue: Secondary | ICD-10-CM | POA: Diagnosis not present

## 2022-08-05 DIAGNOSIS — Z6841 Body Mass Index (BMI) 40.0 and over, adult: Secondary | ICD-10-CM | POA: Diagnosis not present

## 2022-08-05 DIAGNOSIS — Z79899 Other long term (current) drug therapy: Secondary | ICD-10-CM | POA: Diagnosis not present

## 2022-08-05 DIAGNOSIS — Z0000 Encounter for general adult medical examination without abnormal findings: Secondary | ICD-10-CM | POA: Diagnosis not present

## 2022-08-05 DIAGNOSIS — Z1339 Encounter for screening examination for other mental health and behavioral disorders: Secondary | ICD-10-CM | POA: Diagnosis not present

## 2022-08-05 DIAGNOSIS — Z125 Encounter for screening for malignant neoplasm of prostate: Secondary | ICD-10-CM | POA: Diagnosis not present

## 2022-08-05 DIAGNOSIS — I429 Cardiomyopathy, unspecified: Secondary | ICD-10-CM | POA: Diagnosis not present

## 2022-08-05 DIAGNOSIS — E7800 Pure hypercholesterolemia, unspecified: Secondary | ICD-10-CM | POA: Diagnosis not present

## 2022-08-05 DIAGNOSIS — I10 Essential (primary) hypertension: Secondary | ICD-10-CM | POA: Diagnosis not present

## 2022-08-05 DIAGNOSIS — Z299 Encounter for prophylactic measures, unspecified: Secondary | ICD-10-CM | POA: Diagnosis not present

## 2022-08-05 DIAGNOSIS — Z7189 Other specified counseling: Secondary | ICD-10-CM | POA: Diagnosis not present

## 2022-08-06 ENCOUNTER — Ambulatory Visit (HOSPITAL_COMMUNITY)
Admission: RE | Admit: 2022-08-06 | Discharge: 2022-08-06 | Disposition: A | Discharge status: Home / Self Care | Payer: Medicare Other | Source: Ambulatory Visit | Attending: Urology | Admitting: Urology

## 2022-08-06 DIAGNOSIS — N200 Calculus of kidney: Secondary | ICD-10-CM | POA: Insufficient documentation

## 2022-08-06 DIAGNOSIS — N281 Cyst of kidney, acquired: Secondary | ICD-10-CM | POA: Diagnosis not present

## 2022-08-07 ENCOUNTER — Ambulatory Visit (INDEPENDENT_AMBULATORY_CARE_PROVIDER_SITE_OTHER): Payer: Medicare Other | Admitting: Urology

## 2022-08-07 VITALS — BP 127/71 | HR 51

## 2022-08-07 DIAGNOSIS — N200 Calculus of kidney: Secondary | ICD-10-CM

## 2022-08-07 LAB — MICROSCOPIC EXAMINATION: Bacteria, UA: NONE SEEN

## 2022-08-07 LAB — URINALYSIS, ROUTINE W REFLEX MICROSCOPIC
Bilirubin, UA: NEGATIVE
Glucose, UA: NEGATIVE
Ketones, UA: NEGATIVE
Nitrite, UA: NEGATIVE
Specific Gravity, UA: 1.02 (ref 1.005–1.030)
Urobilinogen, Ur: 2 mg/dL — ABNORMAL HIGH (ref 0.2–1.0)
pH, UA: 5 (ref 5.0–7.5)

## 2022-08-07 NOTE — Progress Notes (Unsigned)
08/07/2022 10:01 AM   Holland Commons March 06, 1946 [id number]  Referring provider: Glenda Chroman, MD [place],  [place] [address]  No chief complaint on file.   HPI:    PMH: Past Medical History:  Diagnosis Date   Anxiety    Atrial fibrillation ([place])    Hypertension    Shortness of breath    Sleep apnea     Surgical History: Past Surgical History:  Procedure Laterality Date   Anal cyst Removal     COLONOSCOPY N/A 06/20/2014   Procedure: COLONOSCOPY;  Surgeon: Rogene Houston, MD;  [facility];  Service: Endoscopy;  Laterality: N/A;  1030   COLONOSCOPY WITH PROPOFOL N/A 03/14/2021   Procedure: COLONOSCOPY WITH PROPOFOL;  Surgeon: Harvel Quale, MD;  [facility];  Service: Gastroenterology;  Laterality: N/A;  10:50   CYSTOSCOPY     EXTRACORPOREAL SHOCK WAVE LITHOTRIPSY Left 08/04/2022   Procedure: EXTRACORPOREAL SHOCK WAVE LITHOTRIPSY (ESWL);  Surgeon: Cleon Gustin, MD;  Location: [facility];  Service: Urology;  Laterality: Left;   GREEN LIGHT LASER TURP (TRANSURETHRAL RESECTION OF PROSTATE     POLYPECTOMY  03/14/2021   Procedure: POLYPECTOMY;  Surgeon: Harvel Quale, MD;  [facility];  Service: Gastroenterology;;    Home Medications:  Allergies as of 08/07/2022   No Known Allergies      Medication List        Accurate as of August 07, 2022 10:01 AM. If you have any questions, ask your nurse or doctor.          acetaminophen 650 MG CR tablet Commonly known as: TYLENOL Take 650 mg by mouth every 8 (eight) hours as needed for pain.   ARIPiprazole 15 MG tablet Commonly known as: ABILIFY Take 15 mg by mouth daily.   buPROPion 200 MG 12 hr tablet Commonly known as: WELLBUTRIN SR Take 200 mg by mouth daily as needed (Depression).   diclofenac Sodium 1 % Gel Commonly known as: VOLTAREN Apply 2 g topically as needed (pain).   latanoprost 0.005 % ophthalmic solution Commonly known as:  XALATAN 1 drop at bedtime.   lisinopril 40 MG tablet Commonly known as: ZESTRIL Take 40 mg by mouth daily.   meclizine 25 MG tablet Commonly known as: ANTIVERT Take 25 mg by mouth 3 (three) times daily as needed.   ondansetron 4 MG tablet Commonly known as: Zofran Take 1 tablet (4 mg total) by mouth daily as needed for nausea or vomiting.   oxyCODONE 5 MG immediate release tablet Commonly known as: Roxicodone Take 1 tablet (5 mg total) by mouth every 8 (eight) hours as needed.   QUEtiapine 200 MG tablet Commonly known as: SEROQUEL Take 200 mg by mouth daily as needed (anxiety).   rosuvastatin 40 MG tablet Commonly known as: CRESTOR Take 40 mg by mouth daily.   tamsulosin 0.4 MG Caps capsule Commonly known as: Flomax Take 1 capsule (0.4 mg total) by mouth daily after supper.   traZODone 100 MG tablet Commonly known as: DESYREL Take 50 mg by mouth at bedtime as needed for sleep.   verapamil 180 MG 24 hr capsule Commonly known as: VERELAN PM Take 1 capsule (180 mg total) by mouth at bedtime.        Allergies: No Known Allergies  Family History: Family History  Problem Relation Age of Onset   Heart disease Mother    Cancer - Lung Father    Heart attack Father  Social History:  reports that he quit smoking about 25 years ago. His smoking use included cigarettes. He has a 30.00 pack-year smoking history. He has never used smokeless tobacco. He reports current alcohol use. He reports that he does not use drugs.  ROS: All other review of systems were reviewed and are negative except what is noted above in HPI  Physical Exam: BP 127/71   Pulse (!) 51   Constitutional:  Alert and oriented, No acute distress. HEENT: [place] AT, moist mucus membranes.  Trachea midline, no masses. Cardiovascular: No clubbing, cyanosis, or edema. Respiratory: Normal respiratory effort, no increased work of breathing. GI: Abdomen is soft, nontender, nondistended, no abdominal  masses GU: No CVA tenderness.  Lymph: No cervical or inguinal lymphadenopathy. Skin: No rashes, bruises or suspicious lesions. Neurologic: Grossly intact, no focal deficits, moving all 4 extremities. Psychiatric: Normal mood and affect.  Laboratory Data: Lab Results  Component Value Date   WBC 5.7 04/05/2022   HGB 13.9 04/05/2022   HCT 43.7 04/05/2022   MCV 82.9 04/05/2022   PLT 197 04/05/2022    Lab Results  Component Value Date   CREATININE 1.03 04/05/2022    No results found for: "PSA"  No results found for: "TESTOSTERONE"  No results found for: "HGBA1C"  Urinalysis    Component Value Date/Time   COLORURINE AMBER (A) 04/03/2022 1409   APPEARANCEUR Cloudy (A) 04/23/2022 1149   LABSPEC 1.025 04/03/2022 1409   PHURINE 5.0 04/03/2022 1409   GLUCOSEU Negative 04/23/2022 1149   HGBUR LARGE (A) 04/03/2022 1409   BILIRUBINUR Negative 04/23/2022 1149   KETONESUR NEGATIVE 04/03/2022 1409   PROTEINUR Negative 04/23/2022 1149   PROTEINUR >=300 (A) 04/03/2022 1409   NITRITE Negative 04/23/2022 1149   NITRITE NEGATIVE 04/03/2022 1409   LEUKOCYTESUR Negative 04/23/2022 1149   LEUKOCYTESUR MODERATE (A) 04/03/2022 1409    Lab Results  Component Value Date   LABMICR See below: 04/23/2022   WBCUA None seen 04/23/2022   LABEPIT 0-10 04/23/2022   MUCUS Present 04/23/2022   BACTERIA Few 04/23/2022    Pertinent Imaging: *** Results for orders placed during the hospital encounter of 08/04/22  DG Abd 1 View  Narrative CLINICAL DATA:  Pre lithotripsy.  EXAM: ABDOMEN - 1 VIEW  COMPARISON:  CT scan 04/03/2022  FINDINGS: Stable left renal calculus. No definite calcifications along the expected course of the ureters bilaterally. No definite bladder calculi.  The bowel gas pattern is unremarkable. The bony structures are intact.  IMPRESSION: Stable left renal calculus.   Electronically Signed By: Marijo Sanes M.D. On: 08/04/2022 08:21  No results found for  this or any previous visit.  No results found for this or any previous visit.  No results found for this or any previous visit.  No results found for this or any previous visit.  No valid procedures specified. No results found for this or any previous visit.  Results for orders placed during the hospital encounter of 04/03/22  CT Renal Stone Study  Narrative CLINICAL DATA:  Flank pain, kidney stone suspected  EXAM: CT ABDOMEN AND PELVIS WITHOUT CONTRAST  TECHNIQUE: Multidetector CT imaging of the abdomen and pelvis was performed following the standard protocol without IV contrast.  RADIATION DOSE REDUCTION: This exam was performed according to the departmental dose-optimization program which includes automated exposure control, adjustment of the mA and/or kV according to patient size and/or use of iterative reconstruction technique.  COMPARISON:  CT abdomen pelvis 02/25/2016, MRI abdomen 04/17/2016.  FINDINGS: Lower chest:  There is a focal airspace consolidation in the medial right lower lobe (series 4, image 25). Additional eight and 6 mm pulmonary nodules in the peripheral right lower lobe (series 2, images 12 and 14). These findings are new since the prior CT in July 2017.  Hepatobiliary: No focal liver abnormality is seen. The gallbladder is unremarkable.  Pancreas: Unremarkable. No pancreatic ductal dilatation or surrounding inflammatory changes.  Spleen: Normal in size without focal abnormality.  Adrenals/Urinary Tract:  Adrenal glands are unremarkable.  There is symmetric perinephric stranding, increased from prior exam in July 2017. No right renal nephrolithiasis. There is a 1.1 cm stone in the inferior pole the left kidney. No hydroureteronephrosis. No ureter stone.  There is heterogeneous low-density in the lower pole the left kidney, likely related to previously seen multiple lower pole renal cyst on prior MRI. There are small renal cysts in the  lower pole the right kidney also seen on prior MRI. No follow-up imaging is recommended.  There is extensive pelvic inflammatory stranding along a decompressed bladder, the prostate, seminal vesicles, and to a much lesser degree the rectum, which demonstrates no significant wall thickening.  Stomach/Bowel: The stomach is within normal limits. There is no evidence of bowel obstruction.The appendix is normal. Colonic diverticulosis. No diverticulitis. No bowel wall thickening.  Vascular/Lymphatic: Scattered atherosclerosis. No AAA. No lymphadenopathy.  Reproductive: Enlarged prostate gland with adjacent pelvic inflammatory stranding.  Other: No focal fluid collection. No free air. Small fat containing ventral hernia in the upper abdomen with a 1.6 cm aperture (series 2, image 40). Small fat containing inguinal hernias, left greater than right. No bowel containing hernia.  Musculoskeletal: No acute osseous abnormality. No suspicious osseous lesion. Mild bilateral hip osteoarthritis. Multilevel degenerative changes of the spine.  IMPRESSION: Extensive pelvic inflammatory stranding, centered around a decompressed bladder, the prostate, and the seminal vesicles, suggesting cystitis/prostatitis. Recommend correlation with urinalysis. Symmetric perinephric stranding bilaterally, increased from prior exam suggesting potential upper tract infectious involvement, without evidence of obstructive uropathy.  Nonobstructive 1.1 cm stone in the inferior pole of the left kidney.  Focal airspace consolidation in the medial right lower lobe, which could be atelectasis or infection. Additional 8 mm and 6 mm nodules in the periphery of the right lower lobe. Recommend follow-up chest CT in 3 months to assess for interval change.   Electronically Signed By: Maurine Simmering M.D. On: 04/03/2022 13:08   Assessment & Plan:    1. Kidney stone Followup 2 weeks with KUB - Urinalysis, Routine w  reflex microscopic   Return in about 2 weeks (around 08/21/2022) for KUB.  Nicolette Bang, MD  [facility] [place]

## 2022-08-21 DIAGNOSIS — I10 Essential (primary) hypertension: Secondary | ICD-10-CM | POA: Diagnosis not present

## 2022-08-25 ENCOUNTER — Ambulatory Visit (HOSPITAL_COMMUNITY)
Admission: RE | Admit: 2022-08-25 | Discharge: 2022-08-25 | Disposition: A | Discharge status: Home / Self Care | Payer: Medicare Other | Source: Ambulatory Visit | Attending: Urology | Admitting: Urology

## 2022-08-25 ENCOUNTER — Ambulatory Visit (INDEPENDENT_AMBULATORY_CARE_PROVIDER_SITE_OTHER): Payer: Medicare Other | Admitting: Urology

## 2022-08-25 ENCOUNTER — Other Ambulatory Visit: Payer: Self-pay

## 2022-08-25 ENCOUNTER — Encounter: Payer: Self-pay | Admitting: Urology

## 2022-08-25 VITALS — BP 167/72 | HR 76

## 2022-08-25 DIAGNOSIS — N200 Calculus of kidney: Secondary | ICD-10-CM | POA: Insufficient documentation

## 2022-08-25 DIAGNOSIS — M160 Bilateral primary osteoarthritis of hip: Secondary | ICD-10-CM | POA: Diagnosis not present

## 2022-08-25 DIAGNOSIS — Z9889 Other specified postprocedural states: Secondary | ICD-10-CM | POA: Diagnosis not present

## 2022-08-25 DIAGNOSIS — M47816 Spondylosis without myelopathy or radiculopathy, lumbar region: Secondary | ICD-10-CM | POA: Diagnosis not present

## 2022-08-25 MED ORDER — TAMSULOSIN HCL 0.4 MG PO CAPS: 0.4000 mg | ORAL_CAPSULE | Freq: Every day | ORAL | 11 refills | Status: DC

## 2022-08-25 NOTE — Progress Notes (Signed)
08/25/2022 4:22 PM   Robert Warren July 11, 1946 [id number]  Referring provider: Glenda Chroman, MD [place],  [place] [address]  Followupo nephrolithiasis   HPI: Robert Warren is a 77yo here for followup for nephrolithiasis. KUB shows left proximal ureteral calculi. He denies any flank pain. No significant LUTS   PMH: Past Medical History:  Diagnosis Date   Anxiety    Atrial fibrillation (HCC)    Hypertension    Shortness of breath    Sleep apnea     Surgical History: Past Surgical History:  Procedure Laterality Date   Anal cyst Removal     COLONOSCOPY N/A 06/20/2014   Procedure: COLONOSCOPY;  Surgeon: Rogene Houston, MD;  [facility];  Service: Endoscopy;  Laterality: N/A;  1030   COLONOSCOPY WITH PROPOFOL N/A 03/14/2021   Procedure: COLONOSCOPY WITH PROPOFOL;  Surgeon: Harvel Quale, MD;  Location: [facility];  Service: Gastroenterology;  Laterality: N/A;  10:50   CYSTOSCOPY     EXTRACORPOREAL SHOCK WAVE LITHOTRIPSY Left 08/04/2022   Procedure: EXTRACORPOREAL SHOCK WAVE LITHOTRIPSY (ESWL);  Surgeon: Cleon Gustin, MD;  Location: [facility];  Service: Urology;  Laterality: Left;   GREEN LIGHT LASER TURP (TRANSURETHRAL RESECTION OF PROSTATE     POLYPECTOMY  03/14/2021   Procedure: POLYPECTOMY;  Surgeon: Harvel Quale, MD;  [facility]: [facility];  Service: Gastroenterology;;    Home Medications:  Allergies as of 08/25/2022   No Known Allergies      Medication List        Accurate as of August 25, 2022  4:22 PM. If you have any questions, ask your nurse or doctor.          acetaminophen 650 MG CR tablet Commonly known as: TYLENOL Take 650 mg by mouth every 8 (eight) hours as needed for pain.   ARIPiprazole 15 MG tablet Commonly known as: ABILIFY Take 15 mg by mouth daily.   buPROPion 200 MG 12 hr tablet Commonly known as: WELLBUTRIN SR Take 200 mg by mouth daily as needed (Depression).   diclofenac  Sodium 1 % Gel Commonly known as: VOLTAREN Apply 2 g topically as needed (pain).   latanoprost 0.005 % ophthalmic solution Commonly known as: XALATAN 1 drop at bedtime.   lisinopril 40 MG tablet Commonly known as: ZESTRIL Take 40 mg by mouth daily.   meclizine 25 MG tablet Commonly known as: ANTIVERT Take 25 mg by mouth 3 (three) times daily as needed.   ondansetron 4 MG tablet Commonly known as: Zofran Take 1 tablet (4 mg total) by mouth daily as needed for nausea or vomiting.   oxyCODONE 5 MG immediate release tablet Commonly known as: Roxicodone Take 1 tablet (5 mg total) by mouth every 8 (eight) hours as needed.   QUEtiapine 200 MG tablet Commonly known as: SEROQUEL Take 200 mg by mouth daily as needed (anxiety).   rosuvastatin 40 MG tablet Commonly known as: CRESTOR Take 40 mg by mouth daily.   tamsulosin 0.4 MG Caps capsule Commonly known as: Flomax Take 1 capsule (0.4 mg total) by mouth daily after supper.   traZODone 100 MG tablet Commonly known as: DESYREL Take 50 mg by mouth at bedtime as needed for sleep.   verapamil 180 MG 24 hr capsule Commonly known as: VERELAN PM Take 1 capsule (180 mg total) by mouth at bedtime.        Allergies: No Known Allergies  Family History: Family History  Problem Relation Age of  Onset   Heart disease Mother    Cancer - Lung Father    Heart attack Father     Social History:  reports that he quit smoking about 26 years ago. His smoking use included cigarettes. He has a 30.00 pack-year smoking history. He has never used smokeless tobacco. He reports current alcohol use. He reports that he does not use drugs.  ROS: All other review of systems were reviewed and are negative except what is noted above in HPI  Physical Exam: BP (!) 167/72   Pulse 76   Constitutional:  Alert and oriented, No acute distress. HEENT:  AT, moist mucus membranes.  Trachea midline, no masses. Cardiovascular: No clubbing, cyanosis, or  edema. Respiratory: Normal respiratory effort, no increased work of breathing. GI: Abdomen is soft, nontender, nondistended, no abdominal masses GU: No CVA tenderness.  Lymph: No cervical or inguinal lymphadenopathy. Skin: No rashes, bruises or suspicious lesions. Neurologic: Grossly intact, no focal deficits, moving all 4 extremities. Psychiatric: Normal mood and affect.  Laboratory Data: Lab Results  Component Value Date   WBC 5.7 04/05/2022   HGB 13.9 04/05/2022   HCT 43.7 04/05/2022   MCV 82.9 04/05/2022   PLT 197 04/05/2022    Lab Results  Component Value Date   CREATININE 1.03 04/05/2022    No results found for: "PSA"  No results found for: "TESTOSTERONE"  No results found for: "HGBA1C"  Urinalysis    Component Value Date/Time   COLORURINE AMBER (A) 04/03/2022 1409   APPEARANCEUR Clear 08/07/2022 0940   LABSPEC 1.025 04/03/2022 1409   PHURINE 5.0 04/03/2022 1409   GLUCOSEU Negative 08/07/2022 0940   HGBUR LARGE (A) 04/03/2022 1409   BILIRUBINUR Negative 08/07/2022 0940   KETONESUR NEGATIVE 04/03/2022 1409   PROTEINUR 3+ (A) 08/07/2022 0940   PROTEINUR >=300 (A) 04/03/2022 1409   NITRITE Negative 08/07/2022 0940   NITRITE NEGATIVE 04/03/2022 1409   LEUKOCYTESUR Trace (A) 08/07/2022 0940   LEUKOCYTESUR MODERATE (A) 04/03/2022 1409    Lab Results  Component Value Date   LABMICR See below: 08/07/2022   WBCUA 6-10 (A) 08/07/2022   LABEPIT 0-10 08/07/2022   MUCUS Present 04/23/2022   BACTERIA None seen 08/07/2022    Pertinent Imaging: KUB today: Images reviewed and discussed with the patient  Results for orders placed during the hospital encounter of 08/04/22  DG Abd 1 View  Narrative CLINICAL DATA:  Pre lithotripsy.  EXAM: ABDOMEN - 1 VIEW  COMPARISON:  CT scan 04/03/2022  FINDINGS: Stable left renal calculus. No definite calcifications along the expected course of the ureters bilaterally. No definite bladder calculi.  The bowel gas  pattern is unremarkable. The bony structures are intact.  IMPRESSION: Stable left renal calculus.   Electronically Signed By: Marijo Sanes M.D. On: 08/04/2022 08:21  No results found for this or any previous visit.  No results found for this or any previous visit.  No results found for this or any previous visit.  Results for orders placed during the hospital encounter of 08/06/22  US RENAL  Narrative CLINICAL DATA:  Follow-up kidney stones after ESWL.  EXAM: RENAL / URINARY TRACT ULTRASOUND COMPLETE  COMPARISON:  CT scan of the abdomen and pelvis without contrast April 03, 2022  FINDINGS: Right Kidney:  Renal measurements: 13.9 x 5.6 x 6.9 cm = volume: 281 mL. Echogenicity within normal limits. No mass or hydronephrosis visualized.  Left Kidney:  Renal measurements: 13.8 x 6.8 x 6.1 cm = volume: 3 L1 mL. There is  a shadowing mildly echogenic region identified in the mid to lower left kidney measuring up to 16 mm suggestive of a nonobstructive stone. Contains 2 adjacent cysts measuring 2.4 and 2.0 cm. No follow-up imaging recommended for the cysts.  Bladder:  Appears normal for degree of bladder distention.  Other:  None.  IMPRESSION: 1. 16 mm nonobstructive stone in the mid to lower left kidney. 2. No other significant abnormalities.   Electronically Signed By: Dorise Bullion III M.D. On: 08/07/2022 13:40  No valid procedures specified. No results found for this or any previous visit.  Results for orders placed during the hospital encounter of 04/03/22  CT Renal Stone Study  Narrative CLINICAL DATA:  Flank pain, kidney stone suspected  EXAM: CT ABDOMEN AND PELVIS WITHOUT CONTRAST  TECHNIQUE: Multidetector CT imaging of the abdomen and pelvis was performed following the standard protocol without IV contrast.  RADIATION DOSE REDUCTION: This exam was performed according to the departmental dose-optimization program which includes  automated exposure control, adjustment of the mA and/or kV according to patient size and/or use of iterative reconstruction technique.  COMPARISON:  CT abdomen pelvis 02/25/2016, MRI abdomen 04/17/2016.  FINDINGS: Lower chest: There is a focal airspace consolidation in the medial right lower lobe (series 4, image 25). Additional eight and 6 mm pulmonary nodules in the peripheral right lower lobe (series 2, images 12 and 14). These findings are new since the prior CT in July 2017.  Hepatobiliary: No focal liver abnormality is seen. The gallbladder is unremarkable.  Pancreas: Unremarkable. No pancreatic ductal dilatation or surrounding inflammatory changes.  Spleen: Normal in size without focal abnormality.  Adrenals/Urinary Tract:  Adrenal glands are unremarkable.  There is symmetric perinephric stranding, increased from prior exam in July 2017. No right renal nephrolithiasis. There is a 1.1 cm stone in the inferior pole the left kidney. No hydroureteronephrosis. No ureter stone.  There is heterogeneous low-density in the lower pole the left kidney, likely related to previously seen multiple lower pole renal cyst on prior MRI. There are small renal cysts in the lower pole the right kidney also seen on prior MRI. No follow-up imaging is recommended.  There is extensive pelvic inflammatory stranding along a decompressed bladder, the prostate, seminal vesicles, and to a much lesser degree the rectum, which demonstrates no significant wall thickening.  Stomach/Bowel: The stomach is within normal limits. There is no evidence of bowel obstruction.The appendix is normal. Colonic diverticulosis. No diverticulitis. No bowel wall thickening.  Vascular/Lymphatic: Scattered atherosclerosis. No AAA. No lymphadenopathy.  Reproductive: Enlarged prostate gland with adjacent pelvic inflammatory stranding.  Other: No focal fluid collection. No free air. Small fat containing ventral  hernia in the upper abdomen with a 1.6 cm aperture (series 2, image 40). Small fat containing inguinal hernias, left greater than right. No bowel containing hernia.  Musculoskeletal: No acute osseous abnormality. No suspicious osseous lesion. Mild bilateral hip osteoarthritis. Multilevel degenerative changes of the spine.  IMPRESSION: Extensive pelvic inflammatory stranding, centered around a decompressed bladder, the prostate, and the seminal vesicles, suggesting cystitis/prostatitis. Recommend correlation with urinalysis. Symmetric perinephric stranding bilaterally, increased from prior exam suggesting potential upper tract infectious involvement, without evidence of obstructive uropathy.  Nonobstructive 1.1 cm stone in the inferior pole of the left kidney.  Focal airspace consolidation in the medial right lower lobe, which could be atelectasis or infection. Additional 8 mm and 6 mm nodules in the periphery of the right lower lobe. Recommend follow-up chest CT in 3 months to assess for interval  change.   Electronically Signed By: Maurine Simmering M.D. On: 04/03/2022 13:08   Assessment & Plan:    1. Kidney stone -Followup 2-3 weeks with KUB - Urinalysis, Routine w reflex microscopic   No follow-ups on file.  Nicolette Bang, MD  [facility] [place]

## 2022-08-26 LAB — MICROSCOPIC EXAMINATION: Bacteria, UA: NONE SEEN

## 2022-08-26 LAB — URINALYSIS, ROUTINE W REFLEX MICROSCOPIC
Bilirubin, UA: NEGATIVE
Glucose, UA: NEGATIVE
Ketones, UA: NEGATIVE
Leukocytes,UA: NEGATIVE
Nitrite, UA: NEGATIVE
Specific Gravity, UA: 1.02 (ref 1.005–1.030)
Urobilinogen, Ur: 0.2 mg/dL (ref 0.2–1.0)
pH, UA: 5 (ref 5.0–7.5)

## 2022-08-27 DIAGNOSIS — M25561 Pain in right knee: Secondary | ICD-10-CM | POA: Diagnosis not present

## 2022-08-27 DIAGNOSIS — M25562 Pain in left knee: Secondary | ICD-10-CM | POA: Diagnosis not present

## 2022-08-27 DIAGNOSIS — M170 Bilateral primary osteoarthritis of knee: Secondary | ICD-10-CM | POA: Diagnosis not present

## 2022-08-27 DIAGNOSIS — R262 Difficulty in walking, not elsewhere classified: Secondary | ICD-10-CM | POA: Diagnosis not present

## 2022-09-01 ENCOUNTER — Encounter: Payer: Self-pay | Admitting: Urology

## 2022-09-01 NOTE — Patient Instructions (Signed)

## 2022-09-03 DIAGNOSIS — M170 Bilateral primary osteoarthritis of knee: Secondary | ICD-10-CM | POA: Diagnosis not present

## 2022-09-03 DIAGNOSIS — M25562 Pain in left knee: Secondary | ICD-10-CM | POA: Diagnosis not present

## 2022-09-03 DIAGNOSIS — M25561 Pain in right knee: Secondary | ICD-10-CM | POA: Diagnosis not present

## 2022-09-03 DIAGNOSIS — R262 Difficulty in walking, not elsewhere classified: Secondary | ICD-10-CM | POA: Diagnosis not present

## 2022-09-10 DIAGNOSIS — M25562 Pain in left knee: Secondary | ICD-10-CM | POA: Diagnosis not present

## 2022-09-10 DIAGNOSIS — M25561 Pain in right knee: Secondary | ICD-10-CM | POA: Diagnosis not present

## 2022-09-10 DIAGNOSIS — R262 Difficulty in walking, not elsewhere classified: Secondary | ICD-10-CM | POA: Diagnosis not present

## 2022-09-10 DIAGNOSIS — M170 Bilateral primary osteoarthritis of knee: Secondary | ICD-10-CM | POA: Diagnosis not present

## 2022-09-15 DIAGNOSIS — H401111 Primary open-angle glaucoma, right eye, mild stage: Secondary | ICD-10-CM | POA: Diagnosis not present

## 2022-09-17 DIAGNOSIS — M25562 Pain in left knee: Secondary | ICD-10-CM | POA: Diagnosis not present

## 2022-09-17 DIAGNOSIS — R262 Difficulty in walking, not elsewhere classified: Secondary | ICD-10-CM | POA: Diagnosis not present

## 2022-09-17 DIAGNOSIS — M25561 Pain in right knee: Secondary | ICD-10-CM | POA: Diagnosis not present

## 2022-09-17 DIAGNOSIS — M170 Bilateral primary osteoarthritis of knee: Secondary | ICD-10-CM | POA: Diagnosis not present

## 2022-09-21 DIAGNOSIS — I10 Essential (primary) hypertension: Secondary | ICD-10-CM | POA: Diagnosis not present

## 2022-09-28 ENCOUNTER — Encounter: Payer: Self-pay | Admitting: Cardiovascular Disease

## 2022-09-28 ENCOUNTER — Ambulatory Visit: Payer: Medicare Other | Attending: Cardiovascular Disease | Admitting: Cardiovascular Disease

## 2022-09-28 VITALS — BP 132/82 | HR 58 | Ht 76.0 in | Wt 356.0 lb

## 2022-09-28 DIAGNOSIS — R001 Bradycardia, unspecified: Secondary | ICD-10-CM | POA: Insufficient documentation

## 2022-09-28 DIAGNOSIS — G4733 Obstructive sleep apnea (adult) (pediatric): Secondary | ICD-10-CM | POA: Diagnosis not present

## 2022-09-28 DIAGNOSIS — I493 Ventricular premature depolarization: Secondary | ICD-10-CM | POA: Diagnosis not present

## 2022-09-28 DIAGNOSIS — I480 Paroxysmal atrial fibrillation: Secondary | ICD-10-CM | POA: Diagnosis not present

## 2022-09-28 DIAGNOSIS — I10 Essential (primary) hypertension: Secondary | ICD-10-CM | POA: Diagnosis not present

## 2022-09-28 DIAGNOSIS — E785 Hyperlipidemia, unspecified: Secondary | ICD-10-CM

## 2022-09-28 MED ORDER — METOPROLOL SUCCINATE ER 50 MG PO TB24: 50.0000 mg | ORAL_TABLET | Freq: Every day | ORAL | 3 refills | Status: DC

## 2022-09-28 NOTE — Progress Notes (Unsigned)
Cardiology Office Note    Date:  09/30/2022   ID:  Robert Warren, DOB [birth year], MRN [id number]  PCP:  Glenda Chroman, MD  Cardiologist:   Sanda Klein, MD   Chief Complaint  Patient presents with   Irregular Heart Beat    History of Present Illness:  Robert Warren is a 77 y.o. male , Veteran of the [employer], with a history of atrial fibrillation rapid ventricular response actually diagnosed during an episode of urosepsis in 2017.  His echo showed evidence of cor pulmonale, but left ventricular systolic function is normal. He did not have any evidence of ischemia on a nuclear stress test in 2017. He takes a statin for hyperlipidemia and has a history of hypertension which is well compensated. He is morbidly obese. He has OSA and uses CPAP.   He reports 100% compliance with CPAP and denies daytime hypersomnolence.  Later, our sleep clinic assistant told me that he has not used his CPAP throughout the month of January, not sure if this is a download issue or to problem with compliance.  He feels great. The patient specifically denies any chest pain at rest exertion, dyspnea at rest or with exertion, orthopnea, paroxysmal nocturnal dyspnea, syncope, palpitations, focal neurological deficits, intermittent claudication, lower extremity edema, unexplained weight gain, cough, hemoptysis or wheezing.   He had a near syncopal event when working outside in his yard about 5 years ago.  It has not happened since.  Seen that he has episodes of arrhythmia.  He is very active.  He takes his grandkids (age 53-96 years old to school pretty much every day of the week).  He has occasional episodes of dyspnea at rest that he wonders whether could be due to arrhythmia.  He does not have exertional dyspnea or chest discomfort.  He had an episode of shortness of breath for example and mild tightness in his chest when driving on February 5, just before this appointment.  The problem resolved spontaneously.  He  has not had clinically detected episodes of atrial fibrillation but he describes occasional brief episodes of he does not have a orthopnea, PND, edema, dizziness with activity or syncope with activity.  Medical the control is pretty good.  His LDL was 98 but his HDL was little low at 36.  He does not have diabetes mellitus.  He has normal renal function with a creatinine of 1.03.  He developed junctional bradycardia on higher doses of AV nodal blocking agents.  Past Medical History:  Diagnosis Date   Anxiety    Atrial fibrillation ([place])    Hypertension    Shortness of breath    Sleep apnea     Past Surgical History:  Procedure Laterality Date   Anal cyst Removal     COLONOSCOPY N/A 06/20/2014   Procedure: COLONOSCOPY;  Surgeon: Rogene Houston, MD;  [facility];  Service: Endoscopy;  Laterality: N/A;  1030   COLONOSCOPY WITH PROPOFOL N/A 03/14/2021   Procedure: COLONOSCOPY WITH PROPOFOL;  Surgeon: Harvel Quale, MD;  [facility];  Service: Gastroenterology;  Laterality: N/A;  10:50   CYSTOSCOPY     EXTRACORPOREAL SHOCK WAVE LITHOTRIPSY Left 08/04/2022   Procedure: EXTRACORPOREAL SHOCK WAVE LITHOTRIPSY (ESWL);  Surgeon: Cleon Gustin, MD;  Location: [facility];  Service: Urology;  Laterality: Left;   GREEN LIGHT LASER TURP (TRANSURETHRAL RESECTION OF PROSTATE     POLYPECTOMY  03/14/2021   Procedure: POLYPECTOMY;  Surgeon: Harvel Quale, MD;  [facility];  Service: Gastroenterology;;    Current Medications: Outpatient Medications Prior to Visit  Medication Sig Dispense Refill   ARIPiprazole (ABILIFY) 15 MG tablet Take 15 mg by mouth daily.     buPROPion (WELLBUTRIN SR) 200 MG 12 hr tablet Take 200 mg by mouth daily as needed (Depression).     diclofenac Sodium (VOLTAREN) 1 % GEL Apply 2 g topically as needed (pain).     latanoprost (XALATAN) 0.005 % ophthalmic solution 1 drop at bedtime.     lisinopril (ZESTRIL) 40 MG  tablet Take 40 mg by mouth daily.     rosuvastatin (CRESTOR) 40 MG tablet Take 40 mg by mouth daily.     tamsulosin (FLOMAX) 0.4 MG CAPS capsule Take 1 capsule (0.4 mg total) by mouth daily after supper. 30 capsule 11   verapamil (VERELAN PM) 180 MG 24 hr capsule Take 1 capsule (180 mg total) by mouth at bedtime. 30 capsule 6   ondansetron (ZOFRAN) 4 MG tablet Take 1 tablet (4 mg total) by mouth daily as needed for nausea or vomiting. 30 tablet 1   acetaminophen (TYLENOL) 650 MG CR tablet Take 650 mg by mouth every 8 (eight) hours as needed for pain. (Patient not taking: Reported on 09/28/2022)     meclizine (ANTIVERT) 25 MG tablet Take 25 mg by mouth 3 (three) times daily as needed. (Patient not taking: Reported on 09/28/2022)     oxyCODONE (ROXICODONE) 5 MG immediate release tablet Take 1 tablet (5 mg total) by mouth every 8 (eight) hours as needed. (Patient not taking: Reported on 09/28/2022) 30 tablet 0   QUEtiapine (SEROQUEL) 200 MG tablet Take 200 mg by mouth daily as needed (anxiety). (Patient not taking: Reported on 09/28/2022)     traZODone (DESYREL) 100 MG tablet Take 50 mg by mouth at bedtime as needed for sleep. (Patient not taking: Reported on 09/28/2022)     No facility-administered medications prior to visit.     Allergies:   Patient has no known allergies.   Social History   Socioeconomic History   Marital status: Married    Spouse name: Not on file   Number of children: Not on file   Years of education: Not on file   Highest education level: Not on file  Occupational History   Not on file  Tobacco Use   Smoking status: Former    Packs/day: 1.00    Years: 30.00    Total pack years: 30.00    Types: Cigarettes    Quit date: 08/23/1996    Years since quitting: 26.1   Smokeless tobacco: Never  Vaping Use   Vaping Use: Never used  Substance and Sexual Activity   Alcohol use: Yes    Comment: occasional   Drug use: No   Sexual activity: Yes  Other Topics Concern   Not on  file  Social History Narrative   Not on file   Social Determinants of Health   Financial Resource Strain: Not on file  Food Insecurity: Not on file  Transportation Needs: Not on file  Physical Activity: Not on file  Stress: Not on file  Social Connections: Not on file     Family History:  The patient's family history includes Cancer - Lung in his father; Heart attack in his father; Heart disease in his mother.   ROS:   Please see the history of present illness.    ROS All other systems reviewed and are negative.   PHYSICAL EXAM:  VS:  BP 132/82 (BP Location: Left Arm, Patient Position: Sitting, Cuff Size: Large)   Pulse (!) 58   Ht '6\' 4"'$  (1.93 m)   Wt (!) 356 lb (161.5 kg)   SpO2 94%   BMI 43.33 kg/m      General: Alert, oriented x3, no distress, morbidly obese Head: no evidence of trauma, PERRL, EOMI, no exophtalmos or lid lag, no myxedema, no xanthelasma; normal ears, nose and oropharynx Neck: normal jugular venous pulsations and no hepatojugular reflux; brisk carotid pulses without delay and no carotid bruits Chest: clear to auscultation, no signs of consolidation by percussion or palpation, normal fremitus, symmetrical and full respiratory excursions Cardiovascular: normal position and quality of the apical impulse, regular rhythm with occasional ectopy, normal first and second heart sounds, no murmurs, rubs or gallops Abdomen: no tenderness or distention, no masses by palpation, no abnormal pulsatility or arterial bruits, normal bowel sounds, no hepatosplenomegaly Extremities: no clubbing, cyanosis or edema; 2+ radial, ulnar and brachial pulses bilaterally; 2+ right femoral, posterior tibial and dorsalis pedis pulses; 2+ left femoral, posterior tibial and dorsalis pedis pulses; no subclavian or femoral bruits Neurological: grossly nonfocal Psych: Normal mood and affect    Wt Readings from Last 3 Encounters:  09/29/22 (!) 360 lb (163.3 kg)  09/28/22 (!) 356 lb (161.5  kg)  08/04/22 (!) 358 lb 14.5 oz (162.8 kg)      Studies/Labs Reviewed:   EKG:  EKG is ordered today and personally reviewed.  It shows sinus bradycardia at 50 bpm with frequent PVCs.  3 monomorphic PVCs were seen on the 10-second recording.  They all have left bundle branch block inferior axis morphology.  QTc is borderline prolonged at 482 ms.  Recent Labs: 04/05/2022: ALT 74; BUN 18; Creatinine, Ser 1.03; Hemoglobin 13.9; Magnesium 2.3; Platelets 197; Potassium 4.0; Sodium 142     ASSESSMENT:    1. Paroxysmal atrial fibrillation (HCC)   2. PVCs (premature ventricular contractions)   3. Junctional bradycardia   4. OSA (obstructive sleep apnea)   5. Morbid obesity ([place])   6. Dyslipidemia (high LDL; low HDL)   7. Essential hypertension      PLAN:  In order of problems listed above:  Afib: Not particularly symptomatic.  Difficult to tell whether his irregular rhythms (which probably produced the "error" on his blood pressure cuff) are due to his frequent PVCs or episodes of atrial fibrillation.  He is compliant with anticoagulation and has not had any bleeding events.   CHADSVasc 2 (age, HTN).  We are avoiding higher doses of AV nodal blocking agents due to history of bradycardia.  In the past we chose to discontinue his beta-blocker and continue verapamil. PVCs: It is possible that his occasional symptomatic events and his irregular rhythm are due to PVCs rather than atrial fibrillation.  He might do better on beta-blockers as monotherapy, rather than verapamil, since the beta-blocker may benefit both atrial and ventricular arrhythmia.  Stop verapamil, start metoprolol succinate 50 mg once daily. Bradycardia: He does not have any symptoms suggesting severe bradycardia.  Avoid high doses of negative chronotropic agents since he has a history of junctional bradycardia.  Will switch from verapamil to beta-blocker today.  At some point in the future it is possible that he will need a  pacemaker. OSA: Recommended 100% compliance with CPAP.  I think it will be important for him to continue following up in outpatient sleep clinic. Morbid obesity: Weight loss would be beneficial from any points of view.  HLP: Current lipid profile is acceptable, but his HDL cholesterol would improve with additional weight loss. HTN:  adequate control.  Reevaluate after switching from verapamil to metoprolol.       Medication Adjustments/Labs and Tests Ordered: Current medicines are reviewed at length with the patient today.  Concerns regarding medicines are outlined above.  Medication changes, Labs and Tests ordered today are listed in the Patient Instructions below. Patient Instructions  Medication Instructions:  Stop Verapamil Start Metoprolol Succinate '50mg'$  once daily *If you need a refill on your cardiac medications before your next appointment, please call your pharmacy*   Follow-Up: At [facility], you and your health needs are our priority.  As part of our continuing mission to provide you with exceptional heart care, we have created designated Provider Care Teams.  These Care Teams include your primary Cardiologist (physician) and Advanced Practice Providers (APPs -  Physician Assistants and Nurse Practitioners) who all work together to provide you with the care you need, when you need it.  We recommend signing up for the patient portal called "MyChart".  Sign up information is provided on this After Visit Summary.  MyChart is used to connect with patients for Virtual Visits (Telemedicine).  Patients are able to view lab/test results, encounter notes, upcoming appointments, etc.  Non-urgent messages can be sent to your provider as well.   To learn more about what you can do with MyChart, go to [email or web address].    Your next appointment:   6 month(s)  Provider:   Sanda Klein, MD     Other Instructions Send a KARDIA tracing one week after making these medication  changes     Signed, Sanda Klein, MD  09/30/2022 7:19 PM    [address], [place], [place]  [address] Phone: [phone]; Fax: [phone]

## 2022-09-28 NOTE — Patient Instructions (Signed)
Medication Instructions:  Stop Verapamil Start Metoprolol Succinate '50mg'$  once daily *If you need a refill on your cardiac medications before your next appointment, please call your pharmacy*   Follow-Up: At [facility], you and your health needs are our priority.  As part of our continuing mission to provide you with exceptional heart care, we have created designated Provider Care Teams.  These Care Teams include your primary Cardiologist (physician) and Advanced Practice Providers (APPs -  Physician Assistants and Nurse Practitioners) who all work together to provide you with the care you need, when you need it.  We recommend signing up for the patient portal called "MyChart".  Sign up information is provided on this After Visit Summary.  MyChart is used to connect with patients for Virtual Visits (Telemedicine).  Patients are able to view lab/test results, encounter notes, upcoming appointments, etc.  Non-urgent messages can be sent to your provider as well.   To learn more about what you can do with MyChart, go to [email or web address].    Your next appointment:   6 month(s)  Provider:   Sanda Klein, MD     Other Instructions Send a KARDIA tracing one week after making these medication changes

## 2022-09-29 ENCOUNTER — Ambulatory Visit: Payer: Medicare Other | Attending: Cardiovascular Disease | Admitting: Cardiovascular Disease

## 2022-09-29 ENCOUNTER — Other Ambulatory Visit: Payer: Self-pay | Admitting: Cardiovascular Disease

## 2022-09-29 ENCOUNTER — Encounter: Payer: Self-pay | Admitting: Cardiovascular Disease

## 2022-09-29 DIAGNOSIS — I480 Paroxysmal atrial fibrillation: Secondary | ICD-10-CM | POA: Insufficient documentation

## 2022-09-29 DIAGNOSIS — G4733 Obstructive sleep apnea (adult) (pediatric): Secondary | ICD-10-CM

## 2022-09-29 DIAGNOSIS — G4731 Primary central sleep apnea: Secondary | ICD-10-CM | POA: Diagnosis not present

## 2022-09-29 DIAGNOSIS — I10 Essential (primary) hypertension: Secondary | ICD-10-CM | POA: Insufficient documentation

## 2022-09-29 NOTE — Progress Notes (Signed)
Cardiology Office Note    Date:  09/29/2022   ID:  Robert Warren, DOB 1945/09/24, MRN [id number]  PCP:  Glenda Chroman, MD  Cardiologist:  Shelva Majestic, MD (sleep); Dr. Sallyanne Kuster  11 month F/U sleep evaluation with me.  History of Present Illness:  Robert Warren is a 77 y.o. male who is followed by Dr. Sallyanne Kuster for cardiology care.  He is a [place] [employer] veteran and has a history of atrial fibrillation.  A prior echo Doppler study had shown evidence for cor pulmonale but he had normal left ventricular systolic function.  He has a history of obstructive sleep apnea and has been on CPAP therapy since  2017 His DME company is [employer].  His initial evaluation was a home study with [facility] on March 20, 2016.  Reportedly on that study his apnea index was 58.0, RDI (AHI: RDI) was 74/h.  Central apnea index was 46.8/h.  He has a ResMed air sense 10 CPAP unit with a set up date on May 05, 2016.  When the patient was recently seen by Dr. Sallyanne Kuster on March 05, 2021, he admitted to poor sleep, nonrestorative sleep, and the need to take naps during the day.  I saw him for my initial evaluation with me on April 23, 2021.  Robert Warren admits that he uses CPAP every night.  He typically goes to bed around 8 PM but does not go to sleep and usually watches television or reads.  He typically falls asleep around 1030 to 11 PM.  He does not put his CPAP mask until he is about to fall asleep.  He wakes up at 4 AM somewhat restless and usually for good at 5:45 AM.  He brought his machine with him to the office today.  Last night he had used his CPAP for 5 hours and 51 minutes and his AHI was 2.2.  We were subsequently able to get a download and over the past month he has used a 29 of 30 nights and 16 out of 30 nights had usage greater than 4 hours.  His average use was 4.6 hours.  He cannot sleep on his back.  His pressure averages 13.5 cm of water.  AHI was 6.1.  Central apnea index 3.0/h.  An Epworth  Sleepiness Scale score was calculated in the office today and this endorsed at 8. He has a history of morbid obesity.  Due to a history of PAF he was on anticoagulation with apixaban.  With a history of hypertension he was on lisinopril 40 mg, verapamil 120 mg and was on rosuvastatin 40 mg for hyperlipidemia.  During his initial evaluation, blood pressure was elevated and HCTZ 12.5 mg was added to his medical regimen.  During his initial evaluation with me, I made adjustments to his CPAP unit which had been set at a pressure range previously of 5 to 20 cm.  With his 95th percentile pressure 13.5 with maximum average pressure 14.5 I changed his minimum pressure to 10 cm and continued 20 cm maximal pressure.  I also increased his ramp start pressure.  I discussed the importance of weight loss and exercise both for his cardiovascular health and effects on his severe sleep apnea.  I saw him for follow-up evaluation on October 29, 2021 at which time he felt well.  I obtained a new download from February 5 through October 27, 2021 at his 10 to 20 cm setting range.  He is compliant with  usage at 100% and average use at 6 hours and 26 minutes.  He typically goes to bed and watches television at 9 AM but it may be several hours before he puts the mask on.  AHI is 4.8.  On most days there is no mask leak and there were several days with mild mask leak.  [employer] is his DME company.  Since I saw him, his dose of the verapamil has been increased from 120 mg to 180 mg at the [place] 1 month ago.  He continues to be on lisinopril 40 mg and HCTZ 12.5 mg.  He continues to be on Eliquis for anticoagulation and is unaware of any recurrent atrial fibrillation.  He is on rosuvastatin 40 mg for hyperlipidemia.    Since I saw him, he has been evaluated by Dr. Sallyanne Kuster.  When seen by him on June 01, 2022 he was in a bigeminal rhythm.  He was given clearance to undergo lithotripsy.  An echo Doppler study on June 09, 2022 showed an EF of 50  to 55% with mild LVH, there was grade 1 diastolic dysfunction.  There was mildly elevated pulmonary artery systolic pressure 37 mm, mild to moderate LA dilatation and aortic valve sclerosis.  He saw Dr. Sallyanne Kuster yesterday for follow-up evaluation and he has had some brief episodes of some shortness of breath with concern for possible PAF.  Apparently, it appears that he was taken off Eliquis at his hospitalization on 08/04/2022.  Robert Warren presents to the office to see me today in follow-up of his sleep apnea.  I obtained a download from January 8 through September 29, 2022.  Usage was 100% with average use at 7 hours and 37 minutes.  His CPAP AutoSet unit was set at a pressure range of 20 to 20 cm.  AHI is increased at 15.3 with central apnea index of 11.4.  His machine is from 2016 and he started therapy in 2017.  He is CPAP unit is no longer wireless.  He admits to some fatigue.  He is unaware of breakthrough snoring.  He presents for follow-up sleep evaluation.     Past Medical History:  Diagnosis Date   Anxiety    Atrial fibrillation ([place])    Hypertension    Shortness of breath    Sleep apnea     Past Surgical History:  Procedure Laterality Date   Anal cyst Removal     COLONOSCOPY N/A 06/20/2014   Procedure: COLONOSCOPY;  Surgeon: Rogene Houston, MD;  [facility];  Service: Endoscopy;  Laterality: N/A;  1030   COLONOSCOPY WITH PROPOFOL N/A 03/14/2021   Procedure: COLONOSCOPY WITH PROPOFOL;  Surgeon: Harvel Quale, MD;  Location: [facility];  Service: Gastroenterology;  Laterality: N/A;  10:50   CYSTOSCOPY     EXTRACORPOREAL SHOCK WAVE LITHOTRIPSY Left 08/04/2022   Procedure: EXTRACORPOREAL SHOCK WAVE LITHOTRIPSY (ESWL);  Surgeon: Cleon Gustin, MD;  Location: [facility];  Service: Urology;  Laterality: Left;   GREEN LIGHT LASER TURP (TRANSURETHRAL RESECTION OF PROSTATE     POLYPECTOMY  03/14/2021   Procedure: POLYPECTOMY;  Surgeon: Montez Morita,  Quillian Quince, MD;  Location: [facility];  Service: Gastroenterology;;    Current Medications: Outpatient Medications Prior to Visit  Medication Sig Dispense Refill   ARIPiprazole (ABILIFY) 15 MG tablet Take 15 mg by mouth daily.     buPROPion (WELLBUTRIN SR) 200 MG 12 hr tablet Take 200 mg by mouth daily as needed (Depression).  diclofenac Sodium (VOLTAREN) 1 % GEL Apply 2 g topically as needed (pain).     latanoprost (XALATAN) 0.005 % ophthalmic solution 1 drop at bedtime.     lisinopril (ZESTRIL) 40 MG tablet Take 40 mg by mouth daily.     metoprolol succinate (TOPROL-XL) 50 MG 24 hr tablet Take 1 tablet (50 mg total) by mouth daily. Take with or immediately following a meal. 90 tablet 3   ondansetron (ZOFRAN) 4 MG tablet Take 1 tablet (4 mg total) by mouth daily as needed for nausea or vomiting. 30 tablet 1   rosuvastatin (CRESTOR) 40 MG tablet Take 40 mg by mouth daily.     tamsulosin (FLOMAX) 0.4 MG CAPS capsule Take 1 capsule (0.4 mg total) by mouth daily after supper. 30 capsule 11   acetaminophen (TYLENOL) 650 MG CR tablet Take 650 mg by mouth every 8 (eight) hours as needed for pain. (Patient not taking: Reported on 09/28/2022)     meclizine (ANTIVERT) 25 MG tablet Take 25 mg by mouth 3 (three) times daily as needed. (Patient not taking: Reported on 09/28/2022)     oxyCODONE (ROXICODONE) 5 MG immediate release tablet Take 1 tablet (5 mg total) by mouth every 8 (eight) hours as needed. (Patient not taking: Reported on 09/28/2022) 30 tablet 0   QUEtiapine (SEROQUEL) 200 MG tablet Take 200 mg by mouth daily as needed (anxiety). (Patient not taking: Reported on 09/28/2022)     traZODone (DESYREL) 100 MG tablet Take 50 mg by mouth at bedtime as needed for sleep. (Patient not taking: Reported on 09/28/2022)     No facility-administered medications prior to visit.     Allergies:   Patient has no known allergies.   Social History   Socioeconomic History   Marital status: Married    Spouse name:  Not on file   Number of children: Not on file   Years of education: Not on file   Highest education level: Not on file  Occupational History   Not on file  Tobacco Use   Smoking status: Former    Packs/day: 1.00    Years: 30.00    Total pack years: 30.00    Types: Cigarettes    Quit date: 08/23/1996    Years since quitting: 26.1   Smokeless tobacco: Never  Vaping Use   Vaping Use: Never used  Substance and Sexual Activity   Alcohol use: Yes    Comment: occasional   Drug use: No   Sexual activity: Yes  Other Topics Concern   Not on file  Social History Narrative   Not on file   Social Determinants of Health   Financial Resource Strain: Not on file  Food Insecurity: Not on file  Transportation Needs: Not on file  Physical Activity: Not on file  Stress: Not on file  Social Connections: Not on file    Additional social history is notable that he is married for 48 years.  He has 2 children and 6 grandchildren.  He previously had worked as a Games developer at [employer].  He smoked from age 65 and quit smoking in 1999.  Family History:  The patient's family history includes Cancer - Lung in his father; Heart attack in his father; Heart disease in his mother.  His mother died at age 62 with heart disease and father died at age 47 with cancer.  ROS General: Negative; No fevers, chills, or night sweats; morbid obesity HEENT: Negative; No changes in vision or hearing, sinus congestion,  difficulty swallowing Pulmonary: Negative; No cough, wheezing, shortness of breath, hemoptysis Cardiovascular: Positive for hypertension, PAF GI: Negative; No nausea, vomiting, diarrhea, or abdominal pain GU: Negative; No dysuria, hematuria, or difficulty voiding Musculoskeletal: Negative; no myalgias, joint pain, or weakness Hematologic/Oncology: Negative; no easy bruising, bleeding Endocrine: Negative; no heat/cold intolerance; no diabetes Neuro: Negative; no changes in balance,  headaches Skin: Negative; No rashes or skin lesions Psychiatric: Negative; No behavioral problems, depression Sleep: Positive for OSA, on therapy since September 2017.  Admits to fatigability, daytime sleepiness, cannot sleep on his back; negative; No bruxism, restless legs, hypnogognic hallucinations, no cataplexy Other comprehensive 14 point system review is negative.   PHYSICAL EXAM:   VS:  BP (!) 160/90   Pulse 70   Ht '6\' 4"'$  (1.93 m)   Wt (!) 360 lb (163.3 kg)   SpO2 94%   BMI 43.82 kg/m     Repeat blood pressure by me was 124/74  Wt Readings from Last 3 Encounters:  09/29/22 (!) 360 lb (163.3 kg)  09/28/22 (!) 356 lb (161.5 kg)  08/04/22 (!) 358 lb 14.5 oz (162.8 kg)    General: Alert, oriented, no distress.  Skin: normal turgor, no rashes, warm and dry HEENT: Normocephalic, atraumatic. Pupils equal round and reactive to light; sclera anicteric; extraocular muscles intact;  Nose without nasal septal hypertrophy Mouth/Parynx benign; Mallinpatti scale 4 Neck: Thick neck; No JVD, no carotid bruits; normal carotid upstroke Lungs: clear to ausculatation and percussion; no wheezing or rales Chest wall: without tenderness to palpitation Heart: PMI not displaced, RRR, s1 s2 normal, 1/6 systolic murmur, no diastolic murmur, no rubs, gallops, thrills, or heaves Abdomen: Central adiposity;  soft, nontender; no hepatosplenomehaly, BS+; abdominal aorta nontender and not dilated by palpation. Back: no CVA tenderness Pulses 2+ Musculoskeletal: full range of motion, normal strength, no joint deformities Extremities: no clubbing cyanosis or edema, Homan's sign negative  Neurologic: grossly nonfocal; Cranial nerves grossly wnl Psychologic: Normal mood and affect      Studies/Labs Reviewed:   I personally reviewed the ECG from June 01, 2022:  Sinus rhythm with bigeminy, NSSTT wave abnormality   October 29, 2021 ECG (independently read by me):Sinus bradycardia at 51 with sinus  arrythmia; 1st degree AV block, PR 212 msec  April 23, 2021 ECG (independently read by me): Sinus bradycardia at 58, 1st degree AV block  Recent Labs:    Latest Ref Rng & Units 04/05/2022    6:08 AM 04/04/2022    4:51 AM 04/03/2022   12:22 PM  BMP  Glucose 70 - 99 mg/dL 96  108  115   BUN 8 - 23 mg/dL '18  24  21   '$ Creatinine 0.61 - 1.24 mg/dL 1.03  1.38  1.74   Sodium 135 - 145 mmol/L 142  141  138   Potassium 3.5 - 5.1 mmol/L 4.0  3.3  3.4   Chloride 98 - 111 mmol/L 110  111  108   CO2 22 - 32 mmol/L '28  24  22   '$ Calcium 8.9 - 10.3 mg/dL 8.3  8.4  8.4         Latest Ref Rng & Units 04/05/2022    6:08 AM 04/04/2022    4:51 AM 04/03/2022   12:22 PM  Hepatic Function  Total Protein 6.5 - 8.1 g/dL 6.2  6.6  7.0   Albumin 3.5 - 5.0 g/dL 2.6  2.8  3.1   AST 15 - 41 U/L 87  153  108  ALT 0 - 44 U/L 74  90  62   Alk Phosphatase 38 - 126 U/L 63  68  73   Total Bilirubin 0.3 - 1.2 mg/dL 0.7  1.0  1.3        Latest Ref Rng & Units 04/05/2022    6:08 AM 04/04/2022    4:51 AM 04/03/2022   12:22 PM  CBC  WBC 4.0 - 10.5 K/uL 5.7  5.8  7.9   Hemoglobin 13.0 - 17.0 g/dL 13.9  15.4  15.9   Hematocrit 39.0 - 52.0 % 43.7  48.0  49.1   Platelets 150 - 400 K/uL 197  185  216    Lab Results  Component Value Date   MCV 82.9 04/05/2022   MCV 82.6 04/04/2022   MCV 81.2 04/03/2022   Lab Results  Component Value Date   TSH 1.968 02/25/2016   No results found for: "HGBA1C"   BNP    Component Value Date/Time   BNP 173.7 (H) 02/24/2016 1950    ProBNP No results found for: "PROBNP"   Lipid Panel  No results found for: "CHOL", "TRIG", "HDL", "CHOLHDL", "VLDL", "LDLCALC", "LDLDIRECT", "LABVLDL"   RADIOLOGY: No results found.   Additional studies/ records that were reviewed today include:  I was able to obtain the patient's prior sleep evaluation which was a home study with [facility] on March 20, 2016 as noted above.  I reviewed the records of Dr. Sallyanne Kuster.  I obtained  a new download from his CPAP machine which he brought with him today.  His CPAP machine is from 2017, although the download stated August 24, 2020 which is incorrect  ASSESSMENT:    1. Paroxysmal atrial fibrillation (HCC)   2. Essential hypertension   3. Complex sleep apnea syndrome   4. Morbid obesity [facility])     PLAN:  Robert Warren is a 77 year old African-American gentleman is a [place] [employer] veteran and has a history of atrial fibrillation with rapid ventricular response which was initially diagnosed in 2017 during an episode of urosepsis.  At that time, an echo Doppler study showed normal systolic function but there was evidence for cor pulmonale.  Nuclear stress test in 2017 did not show any evidence of ischemia.  He has a history of hyperlipidemia for which he has been on rosuvastatin.  He continues to be on anticoagulation with apixaban.  He was diagnosed with severe sleep apnea on initial home study in 2017 and apparently was set up by [employer] for CPAP therapy on May 05, 2016.  He has been on therapy ever since.  Presently, he has been going to bed at 8 PM but typically does not attempt to sleep until 1030 or 11.  He usually watches television in bed or reads but at times he does doze off during this.  When I initially saw him, I discussed optimal sleep hygiene and recommended that he use bed predominantly for sleep and not to wait several hours before putting his mask on.  In addition, previously he was taking his mask off around 4 AM going to the bathroom and may not put it back on for several hours.  I discussed with him optimal sleep duration at 7 and 9 hours and that he should try using CPAP for ideally 8 hours duration if at all possible.   When I saw him for my initial evaluation I discussed in detail potential adverse consequences of sleep apnea regarding his cardiovascular health.  I obtained a download  from January 8 through September 29, 2022.  Presently, he is having significant  central events on his overall AHI is 15.3 with central apnea index of 11.4.  He is set at a minimum pressure of 12 maximum up to 20 and his 95th percentile pressure is 14.9 with maximum average pressure of 16.1.  Robert Warren CPAP machine is now 77 years old.  It is no longer wireless.  On his initial sleep study on March 20, 2016 he was found to have severe sleep apnea at 58/h.  However, central apnea index was very severe at 46.8/h.  Presently, I believe Robert Warren would benefit from another sleep evaluation.  He is compliant with his current device but I believe he would benefit from BiPAP therapy rather than CPAP.  I will schedule him for a BiPAP titration study and if he is not able to achieve optimal therapy he may need BiPAP ST or ASV.  His most recent echo Doppler study showed an EF of 50 to 55%.  He will be following up with Dr. Sallyanne Kuster.  We will need to verify if Robert Warren is back on Eliquis anticoagulation following his lithotripsy since it is not on his med list today.  I will see him in approximately 4 months for follow-up sleep evaluation following his BiPAP titration and initiation of BiPAP therapy    Medication Adjustments/Labs and Tests Ordered: Current medicines are reviewed at length with the patient today.  Concerns regarding medicines are outlined above.  Medication changes, Labs and Tests ordered today are listed in the Patient Instructions below. Patient Instructions  Medication Instructions:  The current medical regimen is effective;  continue present plan and medications.  *If you need a refill on your cardiac medications before your next appointment, please call your pharmacy*   Follow-Up: At [facility], you and your health needs are our priority.  As part of our continuing mission to provide you with exceptional heart care, we have created designated Provider Care Teams.  These Care Teams include your primary Cardiologist (physician) and Advanced Practice  Providers (APPs -  Physician Assistants and Nurse Practitioners) who all work together to provide you with the care you need, when you need it.  We recommend signing up for the patient portal called "MyChart".  Sign up information is provided on this After Visit Summary.  MyChart is used to connect with patients for Virtual Visits (Telemedicine).  Patients are able to view lab/test results, encounter notes, upcoming appointments, etc.  Non-urgent messages can be sent to your provider as well.   To learn more about what you can do with MyChart, go to [email or web address].    Your next appointment:   4 month(s)  Provider:   Shelva Majestic, MD (sleep)      Signed, Shelva Majestic, MD  09/29/2022 1:36 PM    [facility] [address] [place], [place], [place]  [address] Phone: [phone]

## 2022-09-29 NOTE — Patient Instructions (Signed)
Medication Instructions:  The current medical regimen is effective;  continue present plan and medications.  *If you need a refill on your cardiac medications before your next appointment, please call your pharmacy*   Follow-Up: At [facility], you and your health needs are our priority.  As part of our continuing mission to provide you with exceptional heart care, we have created designated Provider Care Teams.  These Care Teams include your primary Cardiologist (physician) and Advanced Practice Providers (APPs -  Physician Assistants and Nurse Practitioners) who all work together to provide you with the care you need, when you need it.  We recommend signing up for the patient portal called "MyChart".  Sign up information is provided on this After Visit Summary.  MyChart is used to connect with patients for Virtual Visits (Telemedicine).  Patients are able to view lab/test results, encounter notes, upcoming appointments, etc.  Non-urgent messages can be sent to your provider as well.   To learn more about what you can do with MyChart, go to [email or web address].    Your next appointment:   4 month(s)  Provider:   Shelva Majestic, MD (sleep)

## 2022-09-30 ENCOUNTER — Encounter: Payer: Self-pay | Admitting: Cardiovascular Disease

## 2022-10-01 DIAGNOSIS — R262 Difficulty in walking, not elsewhere classified: Secondary | ICD-10-CM | POA: Diagnosis not present

## 2022-10-01 DIAGNOSIS — M25561 Pain in right knee: Secondary | ICD-10-CM | POA: Diagnosis not present

## 2022-10-01 DIAGNOSIS — M25562 Pain in left knee: Secondary | ICD-10-CM | POA: Diagnosis not present

## 2022-10-01 DIAGNOSIS — M170 Bilateral primary osteoarthritis of knee: Secondary | ICD-10-CM | POA: Diagnosis not present

## 2022-10-05 ENCOUNTER — Ambulatory Visit (HOSPITAL_COMMUNITY)
Admission: RE | Admit: 2022-10-05 | Discharge: 2022-10-05 | Disposition: A | Discharge status: Home / Self Care | Payer: Medicare Other | Source: Ambulatory Visit | Attending: Urology | Admitting: Urology

## 2022-10-05 ENCOUNTER — Ambulatory Visit (INDEPENDENT_AMBULATORY_CARE_PROVIDER_SITE_OTHER): Payer: Medicare Other | Admitting: Urology

## 2022-10-05 ENCOUNTER — Encounter: Payer: Self-pay | Admitting: Urology

## 2022-10-05 VITALS — BP 139/55 | HR 62

## 2022-10-05 DIAGNOSIS — Z87442 Personal history of urinary calculi: Secondary | ICD-10-CM | POA: Diagnosis not present

## 2022-10-05 DIAGNOSIS — N200 Calculus of kidney: Secondary | ICD-10-CM | POA: Diagnosis not present

## 2022-10-05 LAB — URINALYSIS, ROUTINE W REFLEX MICROSCOPIC
Bilirubin, UA: NEGATIVE
Glucose, UA: NEGATIVE
Ketones, UA: NEGATIVE
Leukocytes,UA: NEGATIVE
Nitrite, UA: NEGATIVE
Specific Gravity, UA: 1.015 (ref 1.005–1.030)
Urobilinogen, Ur: 0.2 mg/dL (ref 0.2–1.0)
pH, UA: 5.5 (ref 5.0–7.5)

## 2022-10-05 LAB — MICROSCOPIC EXAMINATION
Bacteria, UA: NONE SEEN
RBC, Urine: >30 /HPF — AB (ref 0–2)

## 2022-10-05 NOTE — Progress Notes (Signed)
10/05/2022 11:48 AM   Robert Warren 05/13/1946 [id number]  Referring provider: Ignatius Specking, MD [address] [place],  [place] [address]  FOLLOWUP NEPHROLITHIASIS   HPI: [name] is a 77yo here for followup for nephrolithiasis. He denies any Warren pain. KUB shows no residual calculi. He denies any significant LUTS. No hematuria or dysuria   PMH: Past Medical History:  Diagnosis Date   Anxiety    Atrial fibrillation (HCC)    Hypertension    Shortness of breath    Sleep apnea     Surgical History: Past Surgical History:  Procedure Laterality Date   Anal cyst Removal     COLONOSCOPY N/A 06/20/2014   Procedure: COLONOSCOPY;  Surgeon: Malissa Hippo, MD;  [facility]: [facility];  Service: Endoscopy;  Laterality: N/A;  1030   COLONOSCOPY WITH PROPOFOL N/A 03/14/2021   Procedure: COLONOSCOPY WITH PROPOFOL;  Surgeon: Dolores Frame, MD;  Location: [facility];  Service: Gastroenterology;  Laterality: N/A;  10:50   CYSTOSCOPY     EXTRACORPOREAL SHOCK WAVE LITHOTRIPSY Left 08/04/2022   Procedure: EXTRACORPOREAL SHOCK WAVE LITHOTRIPSY (ESWL);  Surgeon: Malen Gauze, MD;  Location: [facility];  Service: Urology;  Laterality: Left;   GREEN LIGHT LASER TURP (TRANSURETHRAL RESECTION OF PROSTATE     POLYPECTOMY  03/14/2021   Procedure: POLYPECTOMY;  Surgeon: Dolores Frame, MD;  Location: [facility];  Service: Gastroenterology;;    Home Medications:  Allergies as of 10/05/2022   No Known Allergies      Medication List        Accurate as of October 05, 2022 11:48 AM. If you have any questions, ask your nurse or doctor.          ARIPiprazole 15 MG tablet Commonly known as: ABILIFY Take 15 mg by mouth daily.   buPROPion 200 MG 12 hr tablet Commonly known as: WELLBUTRIN SR Take 200 mg by mouth daily as needed (Depression).   diclofenac Sodium 1 % Gel Commonly known as: VOLTAREN Apply 2 g topically as needed (pain).   latanoprost 0.005  % ophthalmic solution Commonly known as: XALATAN 1 drop at bedtime.   lisinopril 40 MG tablet Commonly known as: ZESTRIL Take 40 mg by mouth daily.   metoprolol succinate 50 MG 24 hr tablet Commonly known as: TOPROL-XL Take 1 tablet (50 mg total) by mouth daily. Take with or immediately following a meal.   rosuvastatin 40 MG tablet Commonly known as: CRESTOR Take 40 mg by mouth daily.   tamsulosin 0.4 MG Caps capsule Commonly known as: Flomax Take 1 capsule (0.4 mg total) by mouth daily after supper.        Allergies: No Known Allergies  Family History: Family History  Problem Relation Age of Onset   Heart disease Mother    Cancer - Lung Father    Heart attack Father     Social History:  reports that he quit smoking about 26 years ago. His smoking use included cigarettes. He has a 30.00 pack-year smoking history. He has never used smokeless tobacco. He reports current alcohol use. He reports that he does not use drugs.  ROS: All other review of systems were reviewed and are negative except what is noted above in HPI  Physical Exam: BP (!) 139/55   Pulse 62   Constitutional:  Alert and oriented, No acute distress. HEENT:  AT, moist mucus membranes.  Trachea midline, no masses. Cardiovascular: No clubbing, cyanosis, or edema. Respiratory: Normal respiratory effort,  no increased work of breathing. GI: Abdomen is soft, nontender, nondistended, no abdominal masses GU: No CVA tenderness.  Lymph: No cervical or inguinal lymphadenopathy. Skin: No rashes, bruises or suspicious lesions. Neurologic: Grossly intact, no focal deficits, moving all 4 extremities. Psychiatric: Normal mood and affect.  Laboratory Data: Lab Results  Component Value Date   WBC 5.7 04/05/2022   HGB 13.9 04/05/2022   HCT 43.7 04/05/2022   MCV 82.9 04/05/2022   PLT 197 04/05/2022    Lab Results  Component Value Date   CREATININE 1.03 04/05/2022    No results found for: "PSA"  No  results found for: "TESTOSTERONE"  No results found for: "HGBA1C"  Urinalysis    Component Value Date/Time   COLORURINE AMBER (A) 04/03/2022 1409   APPEARANCEUR Clear 08/25/2022 1553   LABSPEC 1.025 04/03/2022 1409   PHURINE 5.0 04/03/2022 1409   GLUCOSEU Negative 08/25/2022 1553   HGBUR LARGE (A) 04/03/2022 1409   BILIRUBINUR Negative 08/25/2022 1553   KETONESUR NEGATIVE 04/03/2022 1409   PROTEINUR 2+ (A) 08/25/2022 1553   PROTEINUR >=300 (A) 04/03/2022 1409   NITRITE Negative 08/25/2022 1553   NITRITE NEGATIVE 04/03/2022 1409   LEUKOCYTESUR Negative 08/25/2022 1553   LEUKOCYTESUR MODERATE (A) 04/03/2022 1409    Lab Results  Component Value Date   LABMICR See below: 08/25/2022   WBCUA 0-5 08/25/2022   LABEPIT 0-10 08/25/2022   MUCUS Present 04/23/2022   BACTERIA None seen 08/25/2022    Pertinent Imaging: KUB today: Images reviewed and discussed with the patient  Results for orders placed in visit on 08/25/22  DG Abd 1 View  Narrative CLINICAL DATA:  Post lithotripsy.  EXAM: ABDOMEN - 1 VIEW  COMPARISON:  Renal ultrasound 08/06/2022. Abdomen 08/04/2022 CT 04/03/2022.  FINDINGS: Previously noted left renal stone is faintly visualized and appears fragmented with fragments appearing to be present in the left kidney and the midportion of the left ureter. No bowel distention or free air. Degenerative change lumbar spine and both hips. Stable pelvic calcification consistent with a phlebolith.  IMPRESSION: Previously noted left renal stone is faintly visualized and appears fragmented with fragments appearing to be present in the left kidney and the midportion of the left ureter.   Electronically Signed By: Maisie Fus  Register M.D. On: 08/26/2022 10:15  No results found for this or any previous visit.  No results found for this or any previous visit.  No results found for this or any previous visit.  Results for orders placed during the hospital encounter of  08/06/22  US RENAL  Narrative CLINICAL DATA:  Follow-up kidney stones after ESWL.  EXAM: RENAL / URINARY TRACT ULTRASOUND COMPLETE  COMPARISON:  CT scan of the abdomen and pelvis without contrast April 03, 2022  FINDINGS: Right Kidney:  Renal measurements: 13.9 x 5.6 x 6.9 cm = volume: 281 mL. Echogenicity within normal limits. No mass or hydronephrosis visualized.  Left Kidney:  Renal measurements: 13.8 x 6.8 x 6.1 cm = volume: 3 L1 mL. There is a shadowing mildly echogenic region identified in the mid to lower left kidney measuring up to 16 mm suggestive of a nonobstructive stone. Contains 2 adjacent cysts measuring 2.4 and 2.0 cm. No follow-up imaging recommended for the cysts.  Bladder:  Appears normal for degree of bladder distention.  Other:  None.  IMPRESSION: 1. 16 mm nonobstructive stone in the mid to lower left kidney. 2. No other significant abnormalities.   Electronically Signed By: Gerome Sam III M.D. On: 08/07/2022 13:40  No valid procedures specified. No results found for this or any previous visit.  Results for orders placed during the hospital encounter of 04/03/22  CT Renal Stone Study  Narrative CLINICAL DATA:  Warren pain, kidney stone suspected  EXAM: CT ABDOMEN AND PELVIS WITHOUT CONTRAST  TECHNIQUE: Multidetector CT imaging of the abdomen and pelvis was performed following the standard protocol without IV contrast.  RADIATION DOSE REDUCTION: This exam was performed according to the departmental dose-optimization program which includes automated exposure control, adjustment of the mA and/or kV according to patient size and/or use of iterative reconstruction technique.  COMPARISON:  CT abdomen pelvis 02/25/2016, MRI abdomen 04/17/2016.  FINDINGS: Lower chest: There is a focal airspace consolidation in the medial right lower lobe (series 4, image 25). Additional eight and 6 mm pulmonary nodules in the peripheral right  lower lobe (series 2, images 12 and 14). These findings are new since the prior CT in July 2017.  Hepatobiliary: No focal liver abnormality is seen. The gallbladder is unremarkable.  Pancreas: Unremarkable. No pancreatic ductal dilatation or surrounding inflammatory changes.  Spleen: Normal in size without focal abnormality.  Adrenals/Urinary Tract:  Adrenal glands are unremarkable.  There is symmetric perinephric stranding, increased from prior exam in July 2017. No right renal nephrolithiasis. There is a 1.1 cm stone in the inferior pole the left kidney. No hydroureteronephrosis. No ureter stone.  There is heterogeneous low-density in the lower pole the left kidney, likely related to previously seen multiple lower pole renal cyst on prior MRI. There are small renal cysts in the lower pole the right kidney also seen on prior MRI. No follow-up imaging is recommended.  There is extensive pelvic inflammatory stranding along a decompressed bladder, the prostate, seminal vesicles, and to a much lesser degree the rectum, which demonstrates no significant wall thickening.  Stomach/Bowel: The stomach is within normal limits. There is no evidence of bowel obstruction.The appendix is normal. Colonic diverticulosis. No diverticulitis. No bowel wall thickening.  Vascular/Lymphatic: Scattered atherosclerosis. No AAA. No lymphadenopathy.  Reproductive: Enlarged prostate gland with adjacent pelvic inflammatory stranding.  Other: No focal fluid collection. No free air. Small fat containing ventral hernia in the upper abdomen with a 1.6 cm aperture (series 2, image 40). Small fat containing inguinal hernias, left greater than right. No bowel containing hernia.  Musculoskeletal: No acute osseous abnormality. No suspicious osseous lesion. Mild bilateral hip osteoarthritis. Multilevel degenerative changes of the spine.  IMPRESSION: Extensive pelvic inflammatory stranding, centered  around a decompressed bladder, the prostate, and the seminal vesicles, suggesting cystitis/prostatitis. Recommend correlation with urinalysis. Symmetric perinephric stranding bilaterally, increased from prior exam suggesting potential upper tract infectious involvement, without evidence of obstructive uropathy.  Nonobstructive 1.1 cm stone in the inferior pole of the left kidney.  Focal airspace consolidation in the medial right lower lobe, which could be atelectasis or infection. Additional 8 mm and 6 mm nodules in the periphery of the right lower lobe. Recommend follow-up chest CT in 3 months to assess for interval change.   Electronically Signed By: Caprice Renshaw M.D. On: 04/03/2022 13:08   Assessment & Plan:    1. Kidney stone -followup 2-3 months with CT stone study - Urinalysis, Routine w reflex microscopic   No follow-ups on file.  Wilkie Aye, MD  [facility] [place]

## 2022-10-05 NOTE — Patient Instructions (Signed)

## 2022-10-08 ENCOUNTER — Encounter: Payer: Self-pay | Admitting: Urology

## 2022-10-14 DIAGNOSIS — Z299 Encounter for prophylactic measures, unspecified: Secondary | ICD-10-CM | POA: Diagnosis not present

## 2022-10-14 DIAGNOSIS — I700 Atherosclerosis of aorta: Secondary | ICD-10-CM | POA: Diagnosis not present

## 2022-10-14 DIAGNOSIS — R059 Cough, unspecified: Secondary | ICD-10-CM | POA: Diagnosis not present

## 2022-10-14 DIAGNOSIS — J069 Acute upper respiratory infection, unspecified: Secondary | ICD-10-CM | POA: Diagnosis not present

## 2022-10-14 DIAGNOSIS — D6869 Other thrombophilia: Secondary | ICD-10-CM | POA: Diagnosis not present

## 2022-10-27 DIAGNOSIS — R070 Pain in throat: Secondary | ICD-10-CM | POA: Diagnosis not present

## 2022-10-27 DIAGNOSIS — Z6841 Body Mass Index (BMI) 40.0 and over, adult: Secondary | ICD-10-CM | POA: Diagnosis not present

## 2022-10-27 DIAGNOSIS — J209 Acute bronchitis, unspecified: Secondary | ICD-10-CM | POA: Diagnosis not present

## 2022-10-27 DIAGNOSIS — Z299 Encounter for prophylactic measures, unspecified: Secondary | ICD-10-CM | POA: Diagnosis not present

## 2022-10-29 ENCOUNTER — Encounter: Payer: Self-pay | Admitting: Cardiovascular Disease

## 2022-10-29 NOTE — Telephone Encounter (Signed)
That is indeed atrial fibrillation, but the rate is well controlled. If symptoms are not severe, I would just wait it out. Check rhythm again in a few hours and in the morning and get back to [place].

## 2022-11-04 ENCOUNTER — Telehealth: Payer: Self-pay | Admitting: Cardiovascular Disease

## 2022-11-04 NOTE — Telephone Encounter (Signed)
   Pre-operative Risk Assessment    Patient Name: Robert Warren  DOB: March 08, 1946 MRN: [id number]     Request for Surgical Clearance    Procedure:  Dental Extraction - Amount of Teeth to be Pulled:  1  Date of Surgery:  Clearance TBD                                 Surgeon:  Dr. Molli Posey Surgeon's Group or Practice Name:   Phone number:  [phone]  Fax number:  [phone]   Type of Clearance Requested:   - Pharmacy:  Hold Apixaban (Eliquis)     Type of Anesthesia:  Not Indicated   Additional requests/questions:   Caller states they will need to know how long to hold patient's Eliquis.  Signed, Heloise Beecham   11/04/2022, 2:34 PM

## 2022-11-04 NOTE — Telephone Encounter (Signed)
    Primary Cardiologist: Sanda Klein, MD  Chart reviewed as part of pre-operative protocol coverage. Simple dental extractions are considered low risk procedures per guidelines and generally do not require any specific cardiac clearance. It is also generally accepted that for simple extractions and dental cleanings, there is no need to interrupt blood thinner therapy.   SBE prophylaxis is not required for the patient.  I will route this recommendation to the requesting party via Epic fax function and remove from pre-op pool.  Please call with questions.  Mable Fill, Marissa Nestle, NP 11/04/2022, 2:49 PM

## 2022-11-06 DIAGNOSIS — I10 Essential (primary) hypertension: Secondary | ICD-10-CM | POA: Diagnosis not present

## 2022-11-06 DIAGNOSIS — J45909 Unspecified asthma, uncomplicated: Secondary | ICD-10-CM | POA: Diagnosis not present

## 2022-11-06 DIAGNOSIS — Z299 Encounter for prophylactic measures, unspecified: Secondary | ICD-10-CM | POA: Diagnosis not present

## 2022-11-06 DIAGNOSIS — Z6841 Body Mass Index (BMI) 40.0 and over, adult: Secondary | ICD-10-CM | POA: Diagnosis not present

## 2022-11-13 ENCOUNTER — Encounter: Payer: Medicare Other | Admitting: Cardiovascular Disease

## 2022-11-30 ENCOUNTER — Ambulatory Visit: Payer: Medicare Other | Attending: Cardiovascular Disease | Admitting: Cardiovascular Disease

## 2022-11-30 DIAGNOSIS — G4733 Obstructive sleep apnea (adult) (pediatric): Secondary | ICD-10-CM | POA: Diagnosis not present

## 2022-12-08 DIAGNOSIS — Z6841 Body Mass Index (BMI) 40.0 and over, adult: Secondary | ICD-10-CM | POA: Diagnosis not present

## 2022-12-08 DIAGNOSIS — I10 Essential (primary) hypertension: Secondary | ICD-10-CM | POA: Diagnosis not present

## 2022-12-08 DIAGNOSIS — J209 Acute bronchitis, unspecified: Secondary | ICD-10-CM | POA: Diagnosis not present

## 2022-12-08 DIAGNOSIS — Z299 Encounter for prophylactic measures, unspecified: Secondary | ICD-10-CM | POA: Diagnosis not present

## 2022-12-11 DIAGNOSIS — R059 Cough, unspecified: Secondary | ICD-10-CM | POA: Diagnosis not present

## 2022-12-14 ENCOUNTER — Encounter: Payer: Self-pay | Admitting: Cardiovascular Disease

## 2022-12-14 NOTE — Procedures (Signed)
[facility]        Patient Name: Robert Warren, Robert Warren Date: 11/30/2022 Gender: Male D.O.B: 06-05-1946 Age (years): 20 Referring Provider: Nicki Guadalajara MD, ABSM Height (inches): 76 Interpreting Physician: Nicki Guadalajara MD, ABSM Weight (lbs): 350 RPSGT: Alfonso Ellis BMI: 43 MRN: [id number] Neck Size: 19.50  CLINICAL INFORMATION The patient is referred for a BiPAP titration to treat sleep apnea.  Date of NPSG: 2017: AHI 58/h; CAI 46.8/h; Pt has been on CPAP since 2017.  SLEEP STUDY TECHNIQUE As per the AASM Manual for the Scoring of Sleep and Associated Events v2.3 (April 2016) with a hypopnea requiring 4% desaturations.  The channels recorded and monitored were frontal, central and occipital EEG, electrooculogram (EOG), submentalis EMG (chin), nasal and oral airflow, thoracic and abdominal wall motion, anterior tibialis EMG, snore microphone, electrocardiogram, and pulse oximetry. Bilevel positive airway pressure (BPAP) was initiated at the beginning of the study and titrated to treat sleep-disordered breathing.  MEDICATIONS ARIPiprazole (ABILIFY) 15 MG tablet buPROPion (WELLBUTRIN SR) 200 MG 12 hr tablet diclofenac Sodium (VOLTAREN) 1 % GEL latanoprost (XALATAN) 0.005 % ophthalmic solution lisinopril (ZESTRIL) 40 MG tablet metoprolol succinate (TOPROL-XL) 50 MG 24 hr tablet rosuvastatin (CRESTOR) 40 MG tablet tamsulosin (FLOMAX) 0.4 MG CAPS capsule Medications self-administered by patient taken the night of the study : N/A  RESPIRATORY PARAMETERS Optimal IPAP Pressure (cm): 15 AHI at Optimal Pressure (/hr) 0 Optimal EPAP Pressure (cm): 10   Overall Minimal O2 (%): 87.00 Minimal O2 at Optimal Pressure (%): 91.0  SLEEP ARCHITECTURE Start Time: 10:34:02 PM Stop Time: 5:50:02 AM Total Time (min): 436 Total Sleep Time (min): 386 Sleep Latency (min): 1.3 Sleep Efficiency (%): 88.5 REM Latency (min): 54.5 WASO (min): 48.7 Stage N1  (%): 6.99 Stage N2 (%): 56.48 Stage N3 (%): 14.51 Stage R (%): 22 Supine (%): 27.06 Arousal Index (/hr): 8.2   CARDIAC DATA The 2 lead EKG demonstrated sinus rhythm. The mean heart rate was 48.39 beats per minute. Other EKG findings include: PVCs.  LEG MOVEMENT DATA The total Periodic Limb Movements of Sleep (PLMS) were 314. The PLMS index was 48.81. A PLMS index of <15 is considered normal in adults.  IMPRESSIONS - BiPAP ST initated at 8/4 and was titrated to optimial BiPAP ST pressure at 15/10 cm of water, rate 16;  AHI 0, RDI 0, O2 nadir 94%. - Central sleep apnea was not noted during this titration (CAI 2.8/h). - Oxygen desaturations to a nadir of 87% at 8/4 cm of water. - The patient snored with soft snoring volume. - 2-lead EKG demonstrated: PVCs - Moderate periodic limb movements were observed during this study. Arousals associated with PLMs were rare.  DIAGNOSIS - Obstructive Sleep Apnea (G47.33)  RECOMMENDATIONS - Recommend an initial trial of BiPAP ST therapy with IPAP 15, EPAP 10 cm H2O, rate 16 with a Large size Fisher&Paykel Full Face Vitera mask and heated humidification. - Effort should be made to optimize nasal and oropharyngeal patency. - If patient is symptomatic with restless legs consider a trial of pharmacotherapy. - Avoid alcohol, sedatives and other CNS depressants that may worsen sleep apnea and disrupt normal sleep architecture. - Sleep hygiene should be reviewed to assess factors that may improve sleep quality. - Weight management (BMI 43) and regular exercise should be initiated or continued. - Recommend a download and sleep clinic  evaluation after 4 - 6 weeks of therapy  [Electronically signed] 12/14/2022 08:16 PM  Nicki Guadalajara MD, [facility], ABSM Diplomate, American Board of Sleep Medicine  NPI: [id number]  [facility] PH: [phone]   FX: [phone] ACCREDITED BY THE AMERICAN ACADEMY OF SLEEP MEDICINE

## 2022-12-16 ENCOUNTER — Telehealth: Payer: Self-pay | Admitting: *Deleted

## 2022-12-16 NOTE — Telephone Encounter (Signed)
Patient notified titration sleep study has been completed. Order for BIPAP ST faxed to [facility]. Per patient this is who currently servicing his CPAP needs.

## 2022-12-16 NOTE — Telephone Encounter (Signed)
-----   Message from Lennette Bihari, MD sent at 12/14/2022  8:24 PM EDT ----- Burna Mortimer, please notify patient and set up with DME for BiPAP ST

## 2022-12-22 DIAGNOSIS — I10 Essential (primary) hypertension: Secondary | ICD-10-CM | POA: Diagnosis not present

## 2022-12-27 ENCOUNTER — Inpatient Hospital Stay (HOSPITAL_COMMUNITY)
Admission: EM | Admit: 2022-12-27 | Discharge: 2023-01-01 | DRG: 286 | Disposition: A | Discharge status: Home / Self Care | Payer: Medicare Other | Attending: Cardiovascular Disease | Admitting: Cardiovascular Disease

## 2022-12-27 ENCOUNTER — Encounter (HOSPITAL_COMMUNITY): Payer: Self-pay | Admitting: Emergency Medicine

## 2022-12-27 ENCOUNTER — Emergency Department (HOSPITAL_COMMUNITY): Payer: Medicare Other

## 2022-12-27 ENCOUNTER — Other Ambulatory Visit: Payer: Self-pay

## 2022-12-27 DIAGNOSIS — I5021 Acute systolic (congestive) heart failure: Secondary | ICD-10-CM | POA: Diagnosis not present

## 2022-12-27 DIAGNOSIS — H9190 Unspecified hearing loss, unspecified ear: Secondary | ICD-10-CM | POA: Diagnosis present

## 2022-12-27 DIAGNOSIS — E782 Mixed hyperlipidemia: Secondary | ICD-10-CM | POA: Diagnosis present

## 2022-12-27 DIAGNOSIS — R079 Chest pain, unspecified: Secondary | ICD-10-CM | POA: Diagnosis not present

## 2022-12-27 DIAGNOSIS — Z8249 Family history of ischemic heart disease and other diseases of the circulatory system: Secondary | ICD-10-CM

## 2022-12-27 DIAGNOSIS — Z1152 Encounter for screening for COVID-19: Secondary | ICD-10-CM

## 2022-12-27 DIAGNOSIS — Z6841 Body Mass Index (BMI) 40.0 and over, adult: Secondary | ICD-10-CM

## 2022-12-27 DIAGNOSIS — I440 Atrioventricular block, first degree: Secondary | ICD-10-CM | POA: Diagnosis present

## 2022-12-27 DIAGNOSIS — I5031 Acute diastolic (congestive) heart failure: Secondary | ICD-10-CM | POA: Insufficient documentation

## 2022-12-27 DIAGNOSIS — D6869 Other thrombophilia: Secondary | ICD-10-CM | POA: Diagnosis not present

## 2022-12-27 DIAGNOSIS — I110 Hypertensive heart disease with heart failure: Secondary | ICD-10-CM | POA: Diagnosis not present

## 2022-12-27 DIAGNOSIS — I5023 Acute on chronic systolic (congestive) heart failure: Secondary | ICD-10-CM | POA: Diagnosis present

## 2022-12-27 DIAGNOSIS — I2510 Atherosclerotic heart disease of native coronary artery without angina pectoris: Secondary | ICD-10-CM | POA: Diagnosis present

## 2022-12-27 DIAGNOSIS — Z7901 Long term (current) use of anticoagulants: Secondary | ICD-10-CM | POA: Diagnosis not present

## 2022-12-27 DIAGNOSIS — Z79899 Other long term (current) drug therapy: Secondary | ICD-10-CM

## 2022-12-27 DIAGNOSIS — I493 Ventricular premature depolarization: Secondary | ICD-10-CM | POA: Diagnosis present

## 2022-12-27 DIAGNOSIS — I5033 Acute on chronic diastolic (congestive) heart failure: Secondary | ICD-10-CM | POA: Diagnosis not present

## 2022-12-27 DIAGNOSIS — I5020 Unspecified systolic (congestive) heart failure: Secondary | ICD-10-CM | POA: Diagnosis not present

## 2022-12-27 DIAGNOSIS — I4891 Unspecified atrial fibrillation: Secondary | ICD-10-CM | POA: Diagnosis not present

## 2022-12-27 DIAGNOSIS — I428 Other cardiomyopathies: Secondary | ICD-10-CM | POA: Diagnosis present

## 2022-12-27 DIAGNOSIS — G4733 Obstructive sleep apnea (adult) (pediatric): Secondary | ICD-10-CM | POA: Diagnosis present

## 2022-12-27 DIAGNOSIS — R001 Bradycardia, unspecified: Secondary | ICD-10-CM

## 2022-12-27 DIAGNOSIS — Z87891 Personal history of nicotine dependence: Secondary | ICD-10-CM

## 2022-12-27 DIAGNOSIS — I10 Essential (primary) hypertension: Secondary | ICD-10-CM | POA: Diagnosis not present

## 2022-12-27 DIAGNOSIS — I480 Paroxysmal atrial fibrillation: Secondary | ICD-10-CM | POA: Diagnosis not present

## 2022-12-27 DIAGNOSIS — I509 Heart failure, unspecified: Secondary | ICD-10-CM

## 2022-12-27 DIAGNOSIS — N400 Enlarged prostate without lower urinary tract symptoms: Secondary | ICD-10-CM | POA: Diagnosis not present

## 2022-12-27 DIAGNOSIS — F419 Anxiety disorder, unspecified: Secondary | ICD-10-CM | POA: Diagnosis present

## 2022-12-27 DIAGNOSIS — R059 Cough, unspecified: Secondary | ICD-10-CM | POA: Diagnosis not present

## 2022-12-27 DIAGNOSIS — R0602 Shortness of breath: Secondary | ICD-10-CM | POA: Diagnosis not present

## 2022-12-27 MED ORDER — ALBUTEROL SULFATE HFA 108 (90 BASE) MCG/ACT IN AERS: 2.0000 | INHALATION_SPRAY | RESPIRATORY_TRACT | Status: DC | PRN

## 2022-12-27 NOTE — ED Provider Notes (Signed)
[place] EMERGENCY DEPARTMENT AT [facility] Provider Note   CSN: [id number] Arrival date & time: 12/27/22  2302     History  Chief Complaint  Patient presents with   Shortness of Breath    Robert Warren is a 77 y.o. male.  The history is provided by the patient and the spouse.  Shortness of Breath Severity:  Moderate Onset quality:  Gradual Duration:  1 month Timing:  Intermittent Progression:  Worsening Chronicity:  New Relieved by:  Rest Worsened by:  Activity Associated symptoms: cough   Associated symptoms: no fever, no hemoptysis and no vomiting   Associated symptoms comment:  Chest tightness Patient with history of hypertension and atrial fibrillation presents with shortness of breath.  Patient reports for the past month he has had increasing shortness of breath.  It is worse with exertion and lying flat.  No fevers or vomiting.  He has mild chest tightness but no CP at this time.  He has had a dry cough.  He is a non-smoker.  Denies any weight gain or lower extremity edema Reports he was treated for bronchitis by his PCP with steroids within the past month and had a negative x-ray    Home Medications Prior to Admission medications   Medication Sig Start Date End Date Taking? Authorizing Provider  ARIPiprazole (ABILIFY) 15 MG tablet Take 15 mg by mouth daily. 04/22/22   [provider]  buPROPion (WELLBUTRIN SR) 200 MG 12 hr tablet Take 200 mg by mouth daily as needed (Depression). 12/10/15   [provider]  diclofenac Sodium (VOLTAREN) 1 % GEL Apply 2 g topically as needed (pain). 07/30/16   [provider]  latanoprost (XALATAN) 0.005 % ophthalmic solution 1 drop at bedtime. 09/09/21   [provider]  lisinopril (ZESTRIL) 40 MG tablet Take 40 mg by mouth daily.    [provider]  metoprolol succinate (TOPROL-XL) 50 MG 24 hr tablet Take 1 tablet (50 mg total) by mouth daily. Take with or immediately following a  meal. 09/28/22 09/23/23  Croitoru, Mihai, MD  rosuvastatin (CRESTOR) 40 MG tablet Take 40 mg by mouth daily.    [provider]  tamsulosin (FLOMAX) 0.4 MG CAPS capsule Take 1 capsule (0.4 mg total) by mouth daily after supper. 08/25/22   McKenzie, Mardene Celeste, MD      Allergies    Patient has no known allergies.    Review of Systems   Review of Systems  Constitutional:  Negative for fever and unexpected weight change.  Respiratory:  Positive for cough and shortness of breath. Negative for hemoptysis.   Cardiovascular:  Negative for leg swelling.  Gastrointestinal:  Negative for vomiting.    Physical Exam Updated Vital Signs BP 116/68   Pulse (!) 48   Temp (!) 97.5 F (36.4 C) (Oral)   Resp 13   Ht 1.93 m (6\' 4" )   Wt (!) 158.8 kg   SpO2 100%   BMI 42.60 kg/m  Physical Exam CONSTITUTIONAL: Well developed/well nourished, hard of hearing HEAD: Normocephalic/atraumatic EYES: EOMI/PERRL ENMT: Mucous membranes moist NECK: supple no meningeal signs, no JVD SPINE/BACK:entire spine nontender CV: S1/S2 noted, no murmurs/rubs/gallops noted LUNGS: Lungs are clear to auscultation bilaterally, no apparent distress ABDOMEN: soft, nontender NEURO: Pt is awake/alert/appropriate, moves all extremitiesx4.  No facial droop.   EXTREMITIES: pulses normal/equal, full ROM, no significant lower extremity edema SKIN: warm, color normal PSYCH: no abnormalities of mood noted, alert and oriented to situation  ED Results /  Procedures / Treatments   Labs (all labs ordered are listed, but only abnormal results are displayed) Labs Reviewed  CBC - Abnormal; Notable for the following components:      Result Value   RDW 15.8 (*)    All other components within normal limits  BASIC METABOLIC PANEL - Abnormal; Notable for the following components:   Glucose, Bld 102 (*)    Calcium 8.8 (*)    All other components within normal limits  BRAIN NATRIURETIC PEPTIDE - Abnormal; Notable for the following  components:   B Natriuretic Peptide 1,870.0 (*)    All other components within normal limits  RESP PANEL BY RT-PCR (RSV, FLU A&B, COVID)  [id number]    EKG EKG Interpretation  Date/Time:  Sunday Dec 27 2022 23:21:06 EDT Ventricular Rate:  60 PR Interval:  210 QRS Duration: 94 QT Interval:  430 QTC Calculation: 430 R Axis:   -21 Text Interpretation: Sinus rhythm with 1st degree A-V block Septal infarct , age undetermined Abnormal ECG When compared with ECG of 03-Apr-2022 12:10, PREVIOUS ECG IS PRESENT Confirmed by Zadie Rhine ([id number]) on 12/27/2022 11:37:33 PM  Radiology DG Chest 2 View  Result Date: 12/28/2022 CLINICAL DATA:  Shortness of breath, cough, chest pain EXAM: CHEST - 2 VIEW COMPARISON:  12/11/2022 FINDINGS: Heart is upper limits normal in size. Mild vascular congestion. No confluent opacities, effusions or edema. No acute bony abnormality. IMPRESSION: Mild vascular congestion. Electronically Signed   By: Charlett Nose M.D.   On: 12/28/2022 00:17    Procedures Procedures    Medications Ordered in ED Medications  albuterol (VENTOLIN HFA) 108 (90 Base) MCG/ACT inhaler 2 puff (has no administration in time range)  furosemide (LASIX) injection 40 mg (has no administration in time range)    ED Course/ Medical Decision Making/ A&P Clinical Course as of 12/28/22 0132  Mon Dec 28, 2022  0118 Patient history of obesity, hypertension presenting with shortness of breath.  Patient found to have acute CHF which appears to be a new diagnosis.  Patient now reports has been using CPAP during the daytime to help with his breathing.  Blood pressure is being taken in appropriate range, will give Lasix for diuresis.  He will be admitted.  Patient does appear to be in sinus bradycardia but no significant AV blocks [DW]  0132 Discussed with Dr. Thomes Dinning for admission [DW]    Clinical Course User Index [DW] Zadie Rhine, MD                             Medical Decision Making Amount  and/or Complexity of Data Reviewed Labs: ordered. Radiology: ordered.  Risk Prescription drug management. Decision regarding hospitalization.   This patient presents to the ED for concern of shortness of breath, this involves an extensive number of treatment options, and is a complaint that carries with it a high risk of complications and morbidity.  The differential diagnosis includes but is not limited to Acute coronary syndrome, pneumonia, acute pulmonary edema, pneumothorax, acute anemia, pulmonary embolism   Comorbidities that complicate the patient evaluation: Patient's presentation is complicated by their history of hypertension, a-fib  Social Determinants of Health: Patient's  poor health literacy and hard of hearing   increases the complexity of managing their presentation  Additional history obtained: Additional history obtained from spouse Records reviewed  cardiology notes  Lab Tests: I Ordered, and personally interpreted labs.  The pertinent results include: Labs overall unremarkable  Imaging Studies ordered: I ordered imaging studies including X-ray chest   I independently visualized and interpreted imaging which showed cardiomegaly, vascular congestion I agree with the radiologist interpretation  Cardiac Monitoring: The patient was maintained on a cardiac monitor.  I personally viewed and interpreted the cardiac monitor which showed an underlying rhythm of:  sinus bradycardia  Medicines ordered and prescription drug management: I ordered medication including Lasix for pulmonary edema   Critical Interventions:   admission for new onset CHF  Consultations Obtained: I requested consultation with the admitting physician [facility] , and discussed  findings as well as pertinent plan - they recommend: Admit  Reevaluation: After the interventions noted above, I reevaluated the patient and found that they have :stayed the same  Complexity of problems  addressed: Patient's presentation is most consistent with  acute presentation with potential threat to life or bodily function  Disposition: After consideration of the diagnostic results and the patient's response to treatment,  I feel that the patent would benefit from admission   .           Final Clinical Impression(s) / ED Diagnoses Final diagnoses:  Acute systolic congestive heart failure (HCC)  Sinus bradycardia    Rx / DC Orders ED Discharge Orders     None         Zadie Rhine, MD 12/28/22 [phone]

## 2022-12-27 NOTE — ED Triage Notes (Signed)
Pt states he was diagnosed with bronchitis "a couple of months ago" and that he has been short of breath "since then" but that now it is getting worse. Pt reports dyspnea at rest as well as with exertion.

## 2022-12-28 ENCOUNTER — Inpatient Hospital Stay (HOSPITAL_COMMUNITY): Payer: Medicare Other

## 2022-12-28 ENCOUNTER — Other Ambulatory Visit (HOSPITAL_COMMUNITY): Payer: Self-pay | Admitting: *Deleted

## 2022-12-28 DIAGNOSIS — I480 Paroxysmal atrial fibrillation: Secondary | ICD-10-CM | POA: Diagnosis present

## 2022-12-28 DIAGNOSIS — I509 Heart failure, unspecified: Secondary | ICD-10-CM | POA: Diagnosis present

## 2022-12-28 DIAGNOSIS — E782 Mixed hyperlipidemia: Secondary | ICD-10-CM

## 2022-12-28 DIAGNOSIS — Z79899 Other long term (current) drug therapy: Secondary | ICD-10-CM | POA: Diagnosis not present

## 2022-12-28 DIAGNOSIS — I2510 Atherosclerotic heart disease of native coronary artery without angina pectoris: Secondary | ICD-10-CM | POA: Diagnosis present

## 2022-12-28 DIAGNOSIS — I5021 Acute systolic (congestive) heart failure: Secondary | ICD-10-CM

## 2022-12-28 DIAGNOSIS — I110 Hypertensive heart disease with heart failure: Secondary | ICD-10-CM | POA: Diagnosis present

## 2022-12-28 DIAGNOSIS — G4733 Obstructive sleep apnea (adult) (pediatric): Secondary | ICD-10-CM | POA: Diagnosis present

## 2022-12-28 DIAGNOSIS — Z8249 Family history of ischemic heart disease and other diseases of the circulatory system: Secondary | ICD-10-CM | POA: Diagnosis not present

## 2022-12-28 DIAGNOSIS — R079 Chest pain, unspecified: Secondary | ICD-10-CM | POA: Diagnosis not present

## 2022-12-28 DIAGNOSIS — I493 Ventricular premature depolarization: Secondary | ICD-10-CM | POA: Diagnosis present

## 2022-12-28 DIAGNOSIS — N400 Enlarged prostate without lower urinary tract symptoms: Secondary | ICD-10-CM

## 2022-12-28 DIAGNOSIS — I5033 Acute on chronic diastolic (congestive) heart failure: Secondary | ICD-10-CM | POA: Diagnosis not present

## 2022-12-28 DIAGNOSIS — I5020 Unspecified systolic (congestive) heart failure: Secondary | ICD-10-CM | POA: Diagnosis not present

## 2022-12-28 DIAGNOSIS — Z87891 Personal history of nicotine dependence: Secondary | ICD-10-CM | POA: Diagnosis not present

## 2022-12-28 DIAGNOSIS — Z6841 Body Mass Index (BMI) 40.0 and over, adult: Secondary | ICD-10-CM | POA: Diagnosis not present

## 2022-12-28 DIAGNOSIS — R0602 Shortness of breath: Secondary | ICD-10-CM | POA: Diagnosis not present

## 2022-12-28 DIAGNOSIS — Z1152 Encounter for screening for COVID-19: Secondary | ICD-10-CM | POA: Diagnosis not present

## 2022-12-28 DIAGNOSIS — I4891 Unspecified atrial fibrillation: Secondary | ICD-10-CM | POA: Diagnosis not present

## 2022-12-28 DIAGNOSIS — F419 Anxiety disorder, unspecified: Secondary | ICD-10-CM | POA: Diagnosis present

## 2022-12-28 DIAGNOSIS — H9190 Unspecified hearing loss, unspecified ear: Secondary | ICD-10-CM | POA: Diagnosis present

## 2022-12-28 DIAGNOSIS — Z7901 Long term (current) use of anticoagulants: Secondary | ICD-10-CM | POA: Diagnosis not present

## 2022-12-28 DIAGNOSIS — I440 Atrioventricular block, first degree: Secondary | ICD-10-CM | POA: Diagnosis present

## 2022-12-28 DIAGNOSIS — R059 Cough, unspecified: Secondary | ICD-10-CM | POA: Diagnosis not present

## 2022-12-28 DIAGNOSIS — D6869 Other thrombophilia: Secondary | ICD-10-CM | POA: Diagnosis not present

## 2022-12-28 DIAGNOSIS — I5031 Acute diastolic (congestive) heart failure: Secondary | ICD-10-CM

## 2022-12-28 DIAGNOSIS — I428 Other cardiomyopathies: Secondary | ICD-10-CM | POA: Diagnosis present

## 2022-12-28 DIAGNOSIS — I10 Essential (primary) hypertension: Secondary | ICD-10-CM

## 2022-12-28 DIAGNOSIS — I5023 Acute on chronic systolic (congestive) heart failure: Secondary | ICD-10-CM | POA: Diagnosis present

## 2022-12-28 LAB — ECHOCARDIOGRAM COMPLETE
Area-P 1/2: 2.34 cm2
Calc EF: 33.8 %
Height: 76 in
S' Lateral: 4.9 cm
Single Plane A2C EF: 35.8 %
Single Plane A4C EF: 30.7 %
Weight: 5545.01 oz

## 2022-12-28 LAB — CBC
HCT: 45.6 % (ref 39.0–52.0)
HCT: 48.2 % (ref 39.0–52.0)
Hemoglobin: 14.5 g/dL (ref 13.0–17.0)
Hemoglobin: 15.1 g/dL (ref 13.0–17.0)
MCH: 26.7 pg (ref 26.0–34.0)
MCH: 27 pg (ref 26.0–34.0)
MCHC: 31.3 g/dL (ref 30.0–36.0)
MCHC: 31.8 g/dL (ref 30.0–36.0)
MCV: 84.9 fL (ref 80.0–100.0)
MCV: 85.3 fL (ref 80.0–100.0)
Platelets: 181 10*3/uL (ref 150–400)
Platelets: 184 10*3/uL (ref 150–400)
RBC: 5.37 MIL/uL (ref 4.22–5.81)
RBC: 5.65 MIL/uL (ref 4.22–5.81)
RDW: 15.6 % — ABNORMAL HIGH (ref 11.5–15.5)
RDW: 15.8 % — ABNORMAL HIGH (ref 11.5–15.5)
WBC: 6.4 10*3/uL (ref 4.0–10.5)
WBC: 7.1 10*3/uL (ref 4.0–10.5)
nRBC: 0 % (ref 0.0–0.2)
nRBC: 0 % (ref 0.0–0.2)

## 2022-12-28 LAB — BASIC METABOLIC PANEL
Anion gap: 7 (ref 5–15)
BUN: 17 mg/dL (ref 8–23)
CO2: 25 mmol/L (ref 22–32)
Calcium: 8.8 mg/dL — ABNORMAL LOW (ref 8.9–10.3)
Chloride: 106 mmol/L (ref 98–111)
Creatinine, Ser: 1.16 mg/dL (ref 0.61–1.24)
GFR, Estimated: >60 mL/min (ref >60)
Glucose, Bld: 102 mg/dL — ABNORMAL HIGH (ref 70–99)
Potassium: 4.1 mmol/L (ref 3.5–5.1)
Sodium: 138 mmol/L (ref 135–145)

## 2022-12-28 LAB — RESP PANEL BY RT-PCR (RSV, FLU A&B, COVID)  RVPGX2
Influenza A by PCR: NEGATIVE
Influenza B by PCR: NEGATIVE
Resp Syncytial Virus by PCR: NEGATIVE
SARS Coronavirus 2 by RT PCR: NEGATIVE

## 2022-12-28 LAB — CREATININE, SERUM
Creatinine, Ser: 1.13 mg/dL (ref 0.61–1.24)
GFR, Estimated: >60 mL/min (ref >60)

## 2022-12-28 LAB — BRAIN NATRIURETIC PEPTIDE: B Natriuretic Peptide: 1870 pg/mL — ABNORMAL HIGH (ref 0.0–100.0)

## 2022-12-28 MED ORDER — FUROSEMIDE 10 MG/ML IJ SOLN
40.0000 mg | Freq: Two times a day (BID) | INTRAMUSCULAR | Status: DC
  Administered 2022-12-28: 40 mg via INTRAVENOUS
  Filled 2022-12-28: qty 4

## 2022-12-28 MED ORDER — PERFLUTREN LIPID MICROSPHERE
1.0000 mL | INTRAVENOUS | Status: AC | PRN
  Administered 2022-12-28: 5 mL via INTRAVENOUS

## 2022-12-28 MED ORDER — ROSUVASTATIN CALCIUM 20 MG PO TABS
40.0000 mg | ORAL_TABLET | Freq: Every day | ORAL | Status: DC
  Administered 2022-12-28 – 2023-01-01 (×5): 40 mg via ORAL
  Filled 2022-12-28 (×5): qty 2

## 2022-12-28 MED ORDER — FUROSEMIDE 10 MG/ML IJ SOLN
40.0000 mg | Freq: Once | INTRAMUSCULAR | Status: AC
  Administered 2022-12-28: 40 mg via INTRAVENOUS
  Filled 2022-12-28: qty 4

## 2022-12-28 MED ORDER — ONDANSETRON HCL 4 MG/2ML IJ SOLN: 4.0000 mg | Freq: Four times a day (QID) | INTRAMUSCULAR | Status: DC | PRN

## 2022-12-28 MED ORDER — ENOXAPARIN SODIUM 40 MG/0.4ML IJ SOSY: 40.0000 mg | PREFILLED_SYRINGE | INTRAMUSCULAR | Status: DC

## 2022-12-28 MED ORDER — TAMSULOSIN HCL 0.4 MG PO CAPS
0.4000 mg | ORAL_CAPSULE | Freq: Every day | ORAL | Status: DC
  Administered 2022-12-28 – 2022-12-31 (×4): 0.4 mg via ORAL
  Filled 2022-12-28 (×4): qty 1

## 2022-12-28 MED ORDER — ACETAMINOPHEN 325 MG PO TABS: 650.0000 mg | ORAL_TABLET | Freq: Four times a day (QID) | ORAL | Status: DC | PRN

## 2022-12-28 MED ORDER — ONDANSETRON HCL 4 MG PO TABS: 4.0000 mg | ORAL_TABLET | Freq: Four times a day (QID) | ORAL | Status: DC | PRN

## 2022-12-28 MED ORDER — APIXABAN 5 MG PO TABS
5.0000 mg | ORAL_TABLET | Freq: Two times a day (BID) | ORAL | Status: DC
  Administered 2022-12-28 – 2022-12-29 (×3): 5 mg via ORAL
  Filled 2022-12-28 (×3): qty 1

## 2022-12-28 MED ORDER — FUROSEMIDE 40 MG PO TABS
40.0000 mg | ORAL_TABLET | Freq: Every day | ORAL | Status: DC
  Administered 2022-12-28 – 2023-01-01 (×5): 40 mg via ORAL
  Filled 2022-12-28 (×4): qty 1

## 2022-12-28 MED ORDER — ACETAMINOPHEN 650 MG RE SUPP: 650.0000 mg | Freq: Four times a day (QID) | RECTAL | Status: DC | PRN

## 2022-12-28 MED ORDER — LISINOPRIL 10 MG PO TABS
40.0000 mg | ORAL_TABLET | Freq: Every day | ORAL | Status: DC
  Administered 2022-12-28: 40 mg via ORAL
  Filled 2022-12-28: qty 4

## 2022-12-28 MED ORDER — ENOXAPARIN SODIUM 80 MG/0.8ML IJ SOSY
80.0000 mg | PREFILLED_SYRINGE | INTRAMUSCULAR | Status: DC
  Administered 2022-12-28: 80 mg via SUBCUTANEOUS
  Filled 2022-12-28: qty 0.8

## 2022-12-28 MED ORDER — METOPROLOL SUCCINATE ER 50 MG PO TB24
50.0000 mg | ORAL_TABLET | Freq: Every day | ORAL | Status: DC
  Filled 2022-12-28 (×2): qty 1

## 2022-12-28 MED ORDER — FUROSEMIDE 40 MG PO TABS
ORAL_TABLET | ORAL | Status: AC
  Filled 2022-12-28: qty 1

## 2022-12-28 MED ORDER — FUROSEMIDE 40 MG PO TABS: 40.0000 mg | ORAL_TABLET | Freq: Every day | ORAL | Status: DC

## 2022-12-28 NOTE — H&P (Signed)
History and Physical    Patient: Robert Warren BMW:[id number] DOB: 03-26-1946 DOA: 12/27/2022 DOS: the patient was seen and examined on 12/28/2022 PCP: Ignatius Specking, MD  Patient coming from: Home  Chief Complaint:  Chief Complaint  Patient presents with   Shortness of Breath   HPI: Robert Warren is a 77 y.o. male with medical history significant of atrial fibrillation with RVR, OSA, morbid obesity, hypertension, hyperlipidemia who presents to the emergency department due to which is worse with lying flat in bed and exertion.  He complained of chest pressure and he went to his PCP who  prescribed him with antibiotics and steroid due to presumed bronchitis without any improvement in symptoms.  He endorsed having dry cough, but denies weight gain, increased leg swelling, use of tobacco or any other recreational drug use.  ED Course:  In the emergency department, temperature was 97.23F, pulse 46 bpm, BP 171/88, respiratory rate 15/min, O2 sat 94% on room air.  Workup in the ED showed normal CBC and normal BMP except for blood glucose of 102, BNP 1870.  Influenza A, B, SARS coronavirus 2, RSV was negative. Chest x-ray showed mild vascular congestion Patient was treated with IV Lasix 40 mg x 1.  Hospitalist was asked to admit patient for further evaluation and management.  Review of Systems: Review of systems as noted in the HPI. All other systems reviewed and are negative.   Past Medical History:  Diagnosis Date   Anxiety    Atrial fibrillation (HCC)    Hypertension    Shortness of breath    Sleep apnea    Past Surgical History:  Procedure Laterality Date   Anal cyst Removal     COLONOSCOPY N/A 06/20/2014   Procedure: COLONOSCOPY;  Surgeon: Malissa Hippo, MD;  Location: [facility];  Service: Endoscopy;  Laterality: N/A;  1030   COLONOSCOPY WITH PROPOFOL N/A 03/14/2021   Procedure: COLONOSCOPY WITH PROPOFOL;  Surgeon: Dolores Frame, MD;  Location: [facility];   Service: Gastroenterology;  Laterality: N/A;  10:50   CYSTOSCOPY     EXTRACORPOREAL SHOCK WAVE LITHOTRIPSY Left 08/04/2022   Procedure: EXTRACORPOREAL SHOCK WAVE LITHOTRIPSY (ESWL);  Surgeon: Malen Gauze, MD;  Location: [facility];  Service: Urology;  Laterality: Left;   GREEN LIGHT LASER TURP (TRANSURETHRAL RESECTION OF PROSTATE     POLYPECTOMY  03/14/2021   Procedure: POLYPECTOMY;  Surgeon: Dolores Frame, MD;  Location: [facility];  Service: Gastroenterology;;    Social History:  reports that he quit smoking about 26 years ago. His smoking use included cigarettes. He has a 30.00 pack-year smoking history. He has never used smokeless tobacco. He reports current alcohol use. He reports that he does not use drugs.   No Known Allergies  Family History  Problem Relation Age of Onset   Heart disease Mother    Cancer - Lung Father    Heart attack Father      Prior to Admission medications   Medication Sig Start Date End Date Taking? Authorizing Provider  ARIPiprazole (ABILIFY) 15 MG tablet Take 15 mg by mouth daily. 04/22/22   [provider]  buPROPion (WELLBUTRIN SR) 200 MG 12 hr tablet Take 200 mg by mouth daily as needed (Depression). 12/10/15   [provider]  diclofenac Sodium (VOLTAREN) 1 % GEL Apply 2 g topically as needed (pain). 07/30/16   [provider]  latanoprost (XALATAN) 0.005 % ophthalmic solution 1 drop at bedtime. 09/09/21   [provider]  lisinopril (ZESTRIL) 40 MG tablet Take 40 mg by mouth daily.    [provider]  metoprolol succinate (TOPROL-XL) 50 MG 24 hr tablet Take 1 tablet (50 mg total) by mouth daily. Take with or immediately following a meal. 09/28/22 09/23/23  Croitoru, Mihai, MD  rosuvastatin (CRESTOR) 40 MG tablet Take 40 mg by mouth daily.    [provider]  tamsulosin (FLOMAX) 0.4 MG CAPS capsule Take 1 capsule (0.4 mg total) by mouth daily after supper. 08/25/22   McKenzie, Mardene Celeste,  MD    Physical Exam: BP (!) 151/82 (BP Location: Right Arm)   Pulse (!) 58   Temp 97.7 F (36.5 C) (Oral)   Resp 20   Ht 6\' 4"  (1.93 m)   Wt (!) 157.2 kg   SpO2 100%   BMI 42.18 kg/m   General: 77 y.o. year-old male well developed well nourished in no acute distress.  Alert and oriented x3. HEENT: NCAT, EOMI Neck: Supple, trachea medial Cardiovascular: Regular rate and rhythm with no rubs or gallops.  No thyromegaly or JVD noted.  No lower extremity edema. 2/4 pulses in all 4 extremities. Respiratory: Clear to auscultation with no wheezes or rales. Good inspiratory effort. Abdomen: Soft, nontender nondistended with normal bowel sounds x4 quadrants. Muskuloskeletal: No cyanosis, clubbing or edema noted bilaterally Neuro: CN II-XII intact, strength 5/5 x 4, sensation, reflexes intact Skin: No ulcerative lesions noted or rashes Psychiatry: Judgement and insight appear normal. Mood is appropriate for condition and setting          Labs on Admission:  Basic Metabolic Panel: Recent Labs  Lab 12/27/22 2350  NA 138  K 4.1  CL 106  CO2 25  GLUCOSE 102*  BUN 17  CREATININE 1.16  CALCIUM 8.8*   Liver Function Tests: No results for input(s): "AST", "ALT", "ALKPHOS", "BILITOT", "PROT", "ALBUMIN" in the last 168 hours. No results for input(s): "LIPASE", "AMYLASE" in the last 168 hours. No results for input(s): "AMMONIA" in the last 168 hours. CBC: Recent Labs  Lab 12/27/22 2350  WBC 7.1  HGB 14.5  HCT 45.6  MCV 84.9  PLT 184   Cardiac Enzymes: No results for input(s): "CKTOTAL", "CKMB", "CKMBINDEX", "TROPONINI" in the last 168 hours.  BNP (last 3 results) Recent Labs    12/27/22 2350  BNP 1,870.0*    ProBNP (last 3 results) No results for input(s): "PROBNP" in the last 8760 hours.  CBG: No results for input(s): "GLUCAP" in the last 168 hours.  Radiological Exams on Admission: DG Chest 2 View  Result Date: 12/28/2022 CLINICAL DATA:  Shortness of breath,  cough, chest pain EXAM: CHEST - 2 VIEW COMPARISON:  12/11/2022 FINDINGS: Heart is upper limits normal in size. Mild vascular congestion. No confluent opacities, effusions or edema. No acute bony abnormality. IMPRESSION: Mild vascular congestion. Electronically Signed   By: Charlett Nose M.D.   On: 12/28/2022 00:17    EKG: I independently viewed the EKG done and my findings are as followed: Normal sinus rhythm with first-degree heart block at rate of 60 bpm  Assessment/Plan Present on Admission:  Paroxysmal atrial fibrillation (HCC)  OSA (obstructive sleep apnea)  Essential hypertension  Mixed hyperlipidemia  BPH (benign prostatic hyperplasia)  Principal Problem:   CHF (congestive heart failure) (HCC) Active Problems:   BPH (benign prostatic hyperplasia)   Paroxysmal atrial fibrillation (HCC)   OSA (obstructive sleep apnea)   Mixed hyperlipidemia   Essential hypertension  New onset congestive heart failure Continue total  input/output, daily weights and fluid restriction Continue IV Lasix 40 mg twice daily Continue lisinopril, Toprol-XL, Crestor Continue heart healthy diet  Echocardiogram in the morning Cardiology will be consulted  Paroxysmal atrial fibrillation with RVR EKG personally reviewed showed normal sinus rhythm with first-degree heart block at rate of 60 bpm Continue metoprolol No anticoagulation was noted in patient's med rec  Obstructive sleep apnea Stable, patient prefers use of supplemental oxygen via [place]  Essential hypertension (uncontrolled) Continue Lasix, lisinopril and Toprol-XL (with holding parameters)  Mixed hyperlipidemia Continue Crestor  BPH Continue Flomax  DVT prophylaxis: Lovenox  Advance Care Planning: Full code  Consults: Cardiology  Family Communication: None at bedside  Severity of Illness: The appropriate patient status for this patient is INPATIENT. Inpatient status is judged to be reasonable and necessary in order to provide the  required intensity of service to ensure the patient's safety. The patient's presenting symptoms, physical exam findings, and initial radiographic and laboratory data in the context of their chronic comorbidities is felt to place them at high risk for further clinical deterioration. Furthermore, it is not anticipated that the patient will be medically stable for discharge from the hospital within 2 midnights of admission.   * I certify that at the point of admission it is my clinical judgment that the patient will require inpatient hospital care spanning beyond 2 midnights from the point of admission due to high intensity of service, high risk for further deterioration and high frequency of surveillance required.*  Author: Frankey Shown, DO 12/28/2022 5:54 AM  For on call review [email or web address].

## 2022-12-28 NOTE — Progress Notes (Signed)
*  PRELIMINARY RESULTS* Echocardiogram 2D Echocardiogram has been performed with Definity.  Stacey Drain 12/28/2022, 4:21 PM

## 2022-12-28 NOTE — TOC Progression Note (Signed)
  Transition of Care [facility]) Screening Note   Patient Details  Name: Robert Warren Date of Birth: September 13, 1945   Transition of Care [facility]) CM/SW Contact:    Leitha Bleak, RN Phone Number: 12/28/2022, 1:57 PM  Cardiology consulted,  transfer to [facility] VS treat here and DC home Thursday. TOC following.   Transition of Care Department [facility]) has reviewed patient and no TOC needs have been identified at this time. We will continue to monitor patient advancement through interdisciplinary progression rounds. If new patient transition needs arise, please place a TOC consult.      Barriers to Discharge: Continued Medical Work up  Expected Discharge Plan and Services       Living arrangements for the past 2 months: Single Family Home                    Social Determinants of Health (SDOH) Interventions SDOH Screenings   Food Insecurity: No Food Insecurity (12/28/2022)  Housing: Low Risk  (12/28/2022)  Transportation Needs: No Transportation Needs (12/28/2022)  Utilities: Not At Risk (12/28/2022)  Tobacco Use: Medium Risk (12/27/2022)    Readmission Risk Interventions     No data to display

## 2022-12-28 NOTE — ED Notes (Signed)
EDP [place] at bedside. Kellogg RN

## 2022-12-28 NOTE — Progress Notes (Signed)
PROGRESS NOTE  Robert Warren ZOX:[id number] DOB: 1946-04-12 DOA: 12/27/2022 PCP: Ignatius Specking, MD  Brief History16  77 year old male with a history of atrial fibrillation, OSA, morbid obesity, hypertension, hyperlipidemia, bradycardia, BPH presenting with about 6-week history of shortness of breath.  The patient states that he has been following his PCP.  He was diagnosed with bronchitis and given an antibiotic injection and started on prednisone.  There was no significant improvement.  He was given an additional course of prednisone which she finished about a week prior to this admission.  He states that his cough is somewhat better but he continued to have shortness of breath.  The patient complained of increasing abdominal girth and orthopnea type symptoms.  He denies any fevers, chills, headache, neck pain, nausea, vomiting, diarrhea, abdominal pain.  He did have some chest discomfort.  There is no hematochezia or melena. In the ED, the patient was afebrile hemodynamically stable with oxygen saturation 100% room air.  BMP showed sodium 138, potassium 4.1, bicarbonate 25, serum creatinine 1.16.  WBC 7.1, hemoglobin 14.5, platelets 1-84,000.  BNP was 1870.  Chest x-ray showed vascular congestion.  EKG showed sinus rhythm with nonspecific T wave changes.  The patient was started on intravenous furosemide.   Assessment/Plan: Acute HFpEF -06/09/2022 echo EF 50-55%, no WMA, grade 1 DD, normal RVF -Accurate I's and O's -Daily weights -Continue IV furosemide 40 mg IV twice daily -Obtain ReDS reading  Paroxysmal atrial fibrillation -Currently in sinus rhythm -Rate controlled -Continue apixaban  OSA -Continue CPAP while inpatient  Bradycardia -Dr. Royann Shivers switch patient from verampamil to metoprolol succinate 50mg  daily on 09/28/22 -Patient has a history of junctional bradycardia -Avoid high doses of negative chronotropic agents  Essential hypertension -Continue metoprolol  succinate  Morbid obesity -BMI 42.18 -Lifestyle modification  BPH -continue tamulosin         Family Communication:  no Family at bedside  Consultants:  none  Code Status:  FULL   DVT Prophylaxis:apixaban   Procedures: As Listed in Progress Note Above  Antibiotics: None  Total time spent 50 minutes.  Greater than 50% spent face to face counseling and coordinating care.    Subjective: Patient states that he is breathing better today.  He remains short of breath with exertion.  He denies any chest pain, hemoptysis, nausea, vomiting or direct abdominal pain.  He denies any fevers or chills.  Objective: Vitals:   12/28/22 0230 12/28/22 0629 12/28/22 0925 12/28/22 1019  BP: (!) 151/82 138/77  118/71  Pulse: (!) 58 (!) 52 (!) 55 (!) 50  Resp: 20 20  14   Temp: 97.7 F (36.5 C) (!) 97.4 F (36.3 C)  97.8 F (36.6 C)  TempSrc: Oral Oral  Oral  SpO2: 100% 96%  95%  Weight: (!) 157.2 kg     Height: 6\' 4"  (1.93 m)       Intake/Output Summary (Last 24 hours) at 12/28/2022 1255 Last data filed at 12/28/2022 0933 Gross per 24 hour  Intake 660 ml  Output 1975 ml  Net -1315 ml   Weight change:  Exam:  General:  Pt is alert, follows commands appropriately, not in acute distress HEENT: No icterus, No thrush, No neck mass, [place]/AT Cardiovascular: RRR, S1/S2, no rubs, no gallops Respiratory: Fine bibasilar crackles.  No wheezing.  Good air movement Abdomen: Soft/+BS, non tender, non distended, no guarding Extremities: No edema, No lymphangitis, No petechiae, No rashes, no synovitis   Data  Reviewed: I have personally reviewed following labs and imaging studies Basic Metabolic Panel: Recent Labs  Lab 12/27/22 2350 12/28/22 0526  NA 138  --   K 4.1  --   CL 106  --   CO2 25  --   GLUCOSE 102*  --   BUN 17  --   CREATININE 1.16 1.13  CALCIUM 8.8*  --    Liver Function Tests: No results for input(s): "AST", "ALT", "ALKPHOS", "BILITOT", "PROT", "ALBUMIN" in the  last 168 hours. No results for input(s): "LIPASE", "AMYLASE" in the last 168 hours. No results for input(s): "AMMONIA" in the last 168 hours. Coagulation Profile: No results for input(s): "INR", "PROTIME" in the last 168 hours. CBC: Recent Labs  Lab 12/27/22 2350 12/28/22 0526  WBC 7.1 6.4  HGB 14.5 15.1  HCT 45.6 48.2  MCV 84.9 85.3  PLT 184 181   Cardiac Enzymes: No results for input(s): "CKTOTAL", "CKMB", "CKMBINDEX", "TROPONINI" in the last 168 hours. BNP: Invalid input(s): "POCBNP" CBG: No results for input(s): "GLUCAP" in the last 168 hours. HbA1C: No results for input(s): "HGBA1C" in the last 72 hours. Urine analysis:    Component Value Date/Time   COLORURINE AMBER (A) 04/03/2022 1409   APPEARANCEUR Clear 10/05/2022 1103   LABSPEC 1.025 04/03/2022 1409   PHURINE 5.0 04/03/2022 1409   GLUCOSEU Negative 10/05/2022 1103   HGBUR LARGE (A) 04/03/2022 1409   BILIRUBINUR Negative 10/05/2022 1103   KETONESUR NEGATIVE 04/03/2022 1409   PROTEINUR 3+ (A) 10/05/2022 1103   PROTEINUR >=300 (A) 04/03/2022 1409   NITRITE Negative 10/05/2022 1103   NITRITE NEGATIVE 04/03/2022 1409   LEUKOCYTESUR Negative 10/05/2022 1103   LEUKOCYTESUR MODERATE (A) 04/03/2022 1409   Sepsis Labs: @LABRCNTIP (procalcitonin:4,lacticidven:4) ) Recent Results (from the past 240 hour(s))  Resp panel by RT-PCR (RSV, Flu A&B, Covid) Anterior Nasal Swab     Status: None   Collection Time: 12/28/22 12:18 AM   Specimen: Anterior Nasal Swab  Result Value Ref Range Status   SARS Coronavirus 2 by RT PCR NEGATIVE NEGATIVE Final    Comment: (NOTE) SARS-CoV-2 target nucleic acids are NOT DETECTED.  The SARS-CoV-2 RNA is generally detectable in upper respiratory specimens during the acute phase of infection. The lowest concentration of SARS-CoV-2 viral copies this assay can detect is 138 copies/mL. A negative result does not preclude SARS-Cov-2 infection and should not be used as the sole basis for  treatment or other patient management decisions. A negative result may occur with  improper specimen collection/handling, submission of specimen other than nasopharyngeal swab, presence of viral mutation(s) within the areas targeted by this assay, and inadequate number of viral copies(<138 copies/mL). A negative result must be combined with clinical observations, patient history, and epidemiological information. The expected result is Negative.  Fact Sheet for Patients:  BloggerCourse.com  Fact Sheet for Healthcare Providers:  SeriousBroker.it  This test is no t yet approved or cleared by the [place] FDA and  has been authorized for detection and/or diagnosis of SARS-CoV-2 by FDA under an Emergency Use Authorization (EUA). This EUA will remain  in effect (meaning this test can be used) for the duration of the COVID-19 declaration under Section 564(b)(1) of the Act, 21 U.S.C.section 360bbb-3(b)(1), unless the authorization is terminated  or revoked sooner.       Influenza A by PCR NEGATIVE NEGATIVE Final   Influenza B by PCR NEGATIVE NEGATIVE Final    Comment: (NOTE) The Xpert Xpress SARS-CoV-2/FLU/RSV plus assay is intended as an aid in  the diagnosis of influenza from Nasopharyngeal swab specimens and should not be used as a sole basis for treatment. Nasal washings and aspirates are unacceptable for Xpert Xpress SARS-CoV-2/FLU/RSV testing.  Fact Sheet for Patients: BloggerCourse.com  Fact Sheet for Healthcare Providers: SeriousBroker.it  This test is not yet approved or cleared by the [place] FDA and has been authorized for detection and/or diagnosis of SARS-CoV-2 by FDA under an Emergency Use Authorization (EUA). This EUA will remain in effect (meaning this test can be used) for the duration of the COVID-19 declaration under Section 564(b)(1) of the Act, 21  U.S.C. section 360bbb-3(b)(1), unless the authorization is terminated or revoked.     Resp Syncytial Virus by PCR NEGATIVE NEGATIVE Final    Comment: (NOTE) Fact Sheet for Patients: BloggerCourse.com  Fact Sheet for Healthcare Providers: SeriousBroker.it  This test is not yet approved or cleared by the [place] FDA and has been authorized for detection and/or diagnosis of SARS-CoV-2 by FDA under an Emergency Use Authorization (EUA). This EUA will remain in effect (meaning this test can be used) for the duration of the COVID-19 declaration under Section 564(b)(1) of the Act, 21 U.S.C. section 360bbb-3(b)(1), unless the authorization is terminated or revoked.  Performed at [facility], [address] [place], [place] [address]      Scheduled Meds:  apixaban  5 mg Oral BID   furosemide  40 mg Intravenous Q12H   metoprolol succinate  50 mg Oral Daily   Continuous Infusions:  Procedures/Studies: DG Chest 2 View  Result Date: 12/28/2022 CLINICAL DATA:  Shortness of breath, cough, chest pain EXAM: CHEST - 2 VIEW COMPARISON:  12/11/2022 FINDINGS: Heart is upper limits normal in size. Mild vascular congestion. No confluent opacities, effusions or edema. No acute bony abnormality. IMPRESSION: Mild vascular congestion. Electronically Signed   By: Charlett Nose M.D.   On: 12/28/2022 00:17   SLEEP STUDY DOCUMENTS  Result Date: 12/02/2022 Ordered by an unspecified provider.   Catarina Hartshorn, DO  [facility]  If 7PM-7AM, please contact night-coverage [email or web address] Password [email or web address] 12/28/2022, 12:55 PM   LOS: 0 days

## 2022-12-28 NOTE — Hospital Course (Signed)
77 year old male with a history of atrial fibrillation, OSA, morbid obesity, hypertension, hyperlipidemia, bradycardia, BPH presenting with about 6-week history of shortness of breath.  The patient states that he has been following his PCP.  He was diagnosed with bronchitis and given an antibiotic injection and started on prednisone.  There was no significant improvement.  He was given an additional course of prednisone which she finished about a week prior to this admission.  He states that his cough is somewhat better but he continued to have shortness of breath.  The patient complained of increasing abdominal girth and orthopnea type symptoms.  He denies any fevers, chills, headache, neck pain, nausea, vomiting, diarrhea, abdominal pain.  He did have some chest discomfort.  There is no hematochezia or melena. In the ED, the patient was afebrile hemodynamically stable with oxygen saturation 100% room air.  BMP showed sodium 138, potassium 4.1, bicarbonate 25, serum creatinine 1.16.  WBC 7.1, hemoglobin 14.5, platelets 1-84,000.  BNP was 1870.  Chest x-ray showed vascular congestion.  EKG showed sinus rhythm with nonspecific T wave changes.  The patient was started on intravenous furosemide.

## 2022-12-29 ENCOUNTER — Telehealth (HOSPITAL_COMMUNITY): Payer: Self-pay | Admitting: Pharmacy Technician

## 2022-12-29 ENCOUNTER — Other Ambulatory Visit (HOSPITAL_COMMUNITY): Payer: Self-pay

## 2022-12-29 DIAGNOSIS — I4891 Unspecified atrial fibrillation: Secondary | ICD-10-CM

## 2022-12-29 DIAGNOSIS — I10 Essential (primary) hypertension: Secondary | ICD-10-CM | POA: Diagnosis not present

## 2022-12-29 DIAGNOSIS — I5021 Acute systolic (congestive) heart failure: Secondary | ICD-10-CM | POA: Diagnosis not present

## 2022-12-29 DIAGNOSIS — I480 Paroxysmal atrial fibrillation: Secondary | ICD-10-CM | POA: Diagnosis not present

## 2022-12-29 LAB — CBC
HCT: 49.5 % (ref 39.0–52.0)
Hemoglobin: 15.6 g/dL (ref 13.0–17.0)
MCH: 26.7 pg (ref 26.0–34.0)
MCHC: 31.5 g/dL (ref 30.0–36.0)
MCV: 84.8 fL (ref 80.0–100.0)
Platelets: 210 10*3/uL (ref 150–400)
RBC: 5.84 MIL/uL — ABNORMAL HIGH (ref 4.22–5.81)
RDW: 15.9 % — ABNORMAL HIGH (ref 11.5–15.5)
WBC: 6.7 10*3/uL (ref 4.0–10.5)
nRBC: 0 % (ref 0.0–0.2)

## 2022-12-29 LAB — COMPREHENSIVE METABOLIC PANEL
ALT: 16 U/L (ref 0–44)
AST: 16 U/L (ref 15–41)
Albumin: 3.7 g/dL (ref 3.5–5.0)
Alkaline Phosphatase: 67 U/L (ref 38–126)
Anion gap: 10 (ref 5–15)
BUN: 25 mg/dL — ABNORMAL HIGH (ref 8–23)
CO2: 28 mmol/L (ref 22–32)
Calcium: 9.2 mg/dL (ref 8.9–10.3)
Chloride: 101 mmol/L (ref 98–111)
Creatinine, Ser: 1.4 mg/dL — ABNORMAL HIGH (ref 0.61–1.24)
GFR, Estimated: 52 mL/min — ABNORMAL LOW (ref >60)
Glucose, Bld: 117 mg/dL — ABNORMAL HIGH (ref 70–99)
Potassium: 3.6 mmol/L (ref 3.5–5.1)
Sodium: 139 mmol/L (ref 135–145)
Total Bilirubin: 1.7 mg/dL — ABNORMAL HIGH (ref 0.3–1.2)
Total Protein: 7.4 g/dL (ref 6.5–8.1)

## 2022-12-29 LAB — MAGNESIUM: Magnesium: 2.1 mg/dL (ref 1.7–2.4)

## 2022-12-29 LAB — PHOSPHORUS: Phosphorus: 4 mg/dL (ref 2.5–4.6)

## 2022-12-29 MED ORDER — DAPAGLIFLOZIN PROPANEDIOL 10 MG PO TABS: 10.0000 mg | ORAL_TABLET | Freq: Every day | ORAL | Status: DC

## 2022-12-29 MED ORDER — METOPROLOL SUCCINATE ER 25 MG PO TB24
25.0000 mg | ORAL_TABLET | Freq: Every day | ORAL | Status: DC
  Administered 2022-12-29 – 2023-01-01 (×4): 25 mg via ORAL
  Filled 2022-12-29 (×4): qty 1

## 2022-12-29 MED ORDER — METOPROLOL SUCCINATE ER 25 MG PO TB24: 25.0000 mg | ORAL_TABLET | Freq: Every day | ORAL | Status: DC

## 2022-12-29 MED ORDER — EMPAGLIFLOZIN 10 MG PO TABS
10.0000 mg | ORAL_TABLET | Freq: Every day | ORAL | Status: DC
  Administered 2022-12-29 – 2023-01-01 (×4): 10 mg via ORAL
  Filled 2022-12-29 (×6): qty 1

## 2022-12-29 MED ORDER — LOSARTAN POTASSIUM 25 MG PO TABS
25.0000 mg | ORAL_TABLET | Freq: Every day | ORAL | Status: DC
  Administered 2022-12-29 – 2023-01-01 (×4): 25 mg via ORAL
  Filled 2022-12-29 (×4): qty 1

## 2022-12-29 NOTE — Telephone Encounter (Signed)
Pharmacy Patient Advocate Encounter  Insurance verification completed.    The patient is insured through [employer] Medicare Part D   The patient is currently admitted and ran test claims for the following: Entresto, Farxiga, Jardiance.  Copays and coinsurance results were relayed to Inpatient clinical team.   

## 2022-12-29 NOTE — Progress Notes (Signed)
PROGRESS NOTE  Robert Warren EAV:[id number] DOB: 04/06/1946 DOA: 12/27/2022 PCP: Ignatius Specking, MD  Brief History57  77 year old male with a history of atrial fibrillation, OSA, morbid obesity, hypertension, hyperlipidemia, bradycardia, BPH presenting with about 6-week history of shortness of breath.  The patient states that he has been following his PCP.  He was diagnosed with bronchitis and given an antibiotic injection and started on prednisone.  There was no significant improvement.  He was given an additional course of prednisone which she finished about a week prior to this admission.  He states that his cough is somewhat better but he continued to have shortness of breath.  The patient complained of increasing abdominal girth and orthopnea type symptoms.  He denies any fevers, chills, headache, neck pain, nausea, vomiting, diarrhea, abdominal pain.  He did have some chest discomfort.  There is no hematochezia or melena. In the ED, the patient was afebrile hemodynamically stable with oxygen saturation 100% room air.  BMP showed sodium 138, potassium 4.1, bicarbonate 25, serum creatinine 1.16.  WBC 7.1, hemoglobin 14.5, platelets 1-84,000.  BNP was 1870.  Chest x-ray showed vascular congestion.  EKG showed sinus rhythm with nonspecific T wave changes.  The patient was started on intravenous furosemide with clinical improvement, and he was transitioned to po furosemide.  Echo showed EF 30-35%.  Cardiology was consulted and GDMT was added.  Plan is for heart cath when stable.   Assessment/Plan: Acute HFrEF -06/09/2022 echo EF 50-55%, no WMA, grade 1 DD, normal RVF -12/28/22 Echo EF 30-35%, global HK, G1DD -Accurate I's and O's -Daily weights -Continue IV furosemide 40 mg IV twice daily>>po lasix -Obtain ReDS--34 -appreciate cardiology consult>>plan for LHC/RHC this admission -Farxiga/losartan/metoprolol succinate started   Paroxysmal atrial fibrillation -Currently in sinus  rhythm -Rate controlled -Continue apixaban   OSA -Continue CPAP while inpatient   Bradycardia -Dr. Royann Shivers switch patient from verampamil to metoprolol succinate 50mg  daily on 09/28/22--decreased to 25 mg  -Patient has a history of junctional bradycardia -Avoid high doses of negative chronotropic agents   Essential hypertension -Continue metoprolol succinate   Morbid obesity -BMI 42.18 -Lifestyle modification   BPH -continue tamulosin                 Family Communication:  no Family at bedside   Consultants:  none   Code Status:  FULL    DVT Prophylaxis:apixaban     Procedures: As Listed in Progress Note Above   Antibiotics: None          Subjective: Pt states his breathing is improving.  Denies f/c, cp, n/v/d  Objective: Vitals:   12/29/22 0314 12/29/22 0406 12/29/22 0953 12/29/22 1327  BP: (!) 84/58 (!) 107/51 104/75 117/85  Pulse: 60  74 (!) 58  Resp: 20  20   Temp: 98.7 F (37.1 C)  98 F (36.7 C) (!) 97.5 F (36.4 C)  TempSrc:   Oral Oral  SpO2: 95%  95% 98%  Weight:  (!) 154 kg    Height:        Intake/Output Summary (Last 24 hours) at 12/29/2022 1703 Last data filed at 12/29/2022 1400 Gross per 24 hour  Intake 480 ml  Output 800 ml  Net -320 ml   Weight change: -4.759 kg Exam:  General:  Pt is alert, follows commands appropriately, not in acute distress HEENT: No icterus, No thrush, No neck mass, [place]/AT Cardiovascular: RRR, S1/S2, no rubs, no gallops Respiratory: fine  bibasilar crackles.  No wheeze Abdomen: Soft/+BS, non tender, non distended, no guarding Extremities: No edema, No lymphangitis, No petechiae, No rashes, no synovitis   Data Reviewed: I have personally reviewed following labs and imaging studies Basic Metabolic Panel: Recent Labs  Lab 12/27/22 2350 12/28/22 0526 12/29/22 0422  NA 138  --  139  K 4.1  --  3.6  CL 106  --  101  CO2 25  --  28  GLUCOSE 102*  --  117*  BUN 17  --  25*  CREATININE 1.16  1.13 1.40*  CALCIUM 8.8*  --  9.2  MG  --   --  2.1  PHOS  --   --  4.0   Liver Function Tests: Recent Labs  Lab 12/29/22 0422  AST 16  ALT 16  ALKPHOS 67  BILITOT 1.7*  PROT 7.4  ALBUMIN 3.7   No results for input(s): "LIPASE", "AMYLASE" in the last 168 hours. No results for input(s): "AMMONIA" in the last 168 hours. Coagulation Profile: No results for input(s): "INR", "PROTIME" in the last 168 hours. CBC: Recent Labs  Lab 12/27/22 2350 12/28/22 0526 12/29/22 0422  WBC 7.1 6.4 6.7  HGB 14.5 15.1 15.6  HCT 45.6 48.2 49.5  MCV 84.9 85.3 84.8  PLT 184 181 210   Cardiac Enzymes: No results for input(s): "CKTOTAL", "CKMB", "CKMBINDEX", "TROPONINI" in the last 168 hours. BNP: Invalid input(s): "POCBNP" CBG: No results for input(s): "GLUCAP" in the last 168 hours. HbA1C: No results for input(s): "HGBA1C" in the last 72 hours. Urine analysis:    Component Value Date/Time   COLORURINE AMBER (A) 04/03/2022 1409   APPEARANCEUR Clear 10/05/2022 1103   LABSPEC 1.025 04/03/2022 1409   PHURINE 5.0 04/03/2022 1409   GLUCOSEU Negative 10/05/2022 1103   HGBUR LARGE (A) 04/03/2022 1409   BILIRUBINUR Negative 10/05/2022 1103   KETONESUR NEGATIVE 04/03/2022 1409   PROTEINUR 3+ (A) 10/05/2022 1103   PROTEINUR >=300 (A) 04/03/2022 1409   NITRITE Negative 10/05/2022 1103   NITRITE NEGATIVE 04/03/2022 1409   LEUKOCYTESUR Negative 10/05/2022 1103   LEUKOCYTESUR MODERATE (A) 04/03/2022 1409   Sepsis Labs: @LABRCNTIP (procalcitonin:4,lacticidven:4) ) Recent Results (from the past 240 hour(s))  Resp panel by RT-PCR (RSV, Flu A&B, Covid) Anterior Nasal Swab     Status: None   Collection Time: 12/28/22 12:18 AM   Specimen: Anterior Nasal Swab  Result Value Ref Range Status   SARS Coronavirus 2 by RT PCR NEGATIVE NEGATIVE Final    Comment: (NOTE) SARS-CoV-2 target nucleic acids are NOT DETECTED.  The SARS-CoV-2 RNA is generally detectable in upper respiratory specimens during  the acute phase of infection. The lowest concentration of SARS-CoV-2 viral copies this assay can detect is 138 copies/mL. A negative result does not preclude SARS-Cov-2 infection and should not be used as the sole basis for treatment or other patient management decisions. A negative result may occur with  improper specimen collection/handling, submission of specimen other than nasopharyngeal swab, presence of viral mutation(s) within the areas targeted by this assay, and inadequate number of viral copies(<138 copies/mL). A negative result must be combined with clinical observations, patient history, and epidemiological information. The expected result is Negative.  Fact Sheet for Patients:  BloggerCourse.com  Fact Sheet for Healthcare Providers:  SeriousBroker.it  This test is no t yet approved or cleared by the [place] FDA and  has been authorized for detection and/or diagnosis of SARS-CoV-2 by FDA under an Emergency Use Authorization (EUA). This EUA will remain  in effect (meaning this test can be used) for the duration of the COVID-19 declaration under Section 564(b)(1) of the Act, 21 U.S.C.section 360bbb-3(b)(1), unless the authorization is terminated  or revoked sooner.       Influenza A by PCR NEGATIVE NEGATIVE Final   Influenza B by PCR NEGATIVE NEGATIVE Final    Comment: (NOTE) The Xpert Xpress SARS-CoV-2/FLU/RSV plus assay is intended as an aid in the diagnosis of influenza from Nasopharyngeal swab specimens and should not be used as a sole basis for treatment. Nasal washings and aspirates are unacceptable for Xpert Xpress SARS-CoV-2/FLU/RSV testing.  Fact Sheet for Patients: BloggerCourse.com  Fact Sheet for Healthcare Providers: SeriousBroker.it  This test is not yet approved or cleared by the [place] FDA and has been authorized for detection and/or  diagnosis of SARS-CoV-2 by FDA under an Emergency Use Authorization (EUA). This EUA will remain in effect (meaning this test can be used) for the duration of the COVID-19 declaration under Section 564(b)(1) of the Act, 21 U.S.C. section 360bbb-3(b)(1), unless the authorization is terminated or revoked.     Resp Syncytial Virus by PCR NEGATIVE NEGATIVE Final    Comment: (NOTE) Fact Sheet for Patients: BloggerCourse.com  Fact Sheet for Healthcare Providers: SeriousBroker.it  This test is not yet approved or cleared by the [place] FDA and has been authorized for detection and/or diagnosis of SARS-CoV-2 by FDA under an Emergency Use Authorization (EUA). This EUA will remain in effect (meaning this test can be used) for the duration of the COVID-19 declaration under Section 564(b)(1) of the Act, 21 U.S.C. section 360bbb-3(b)(1), unless the authorization is terminated or revoked.  Performed at [facility], [address], [place], [place] [address]      Scheduled Meds:  empagliflozin  10 mg Oral Daily   furosemide  40 mg Oral Daily   losartan  25 mg Oral Daily   metoprolol succinate  25 mg Oral Daily   rosuvastatin  40 mg Oral Daily   tamsulosin  0.4 mg Oral QPC supper   Continuous Infusions:  Procedures/Studies: ECHOCARDIOGRAM COMPLETE  Result Date: 12/28/2022    ECHOCARDIOGRAM REPORT   Patient Name:   BRANTON ALLENDE Date of Exam: 12/28/2022 Medical Rec #:  [id number]       Height:       76.0 in Accession #:    [id number]      Weight:       346.6 lb Date of Birth:  May 15, 1946       BSA:          2.798 m Patient Age:    77 years        BP:           138/77 mmHg Patient Gender: M               HR:           52 bpm. [facility] Procedure: 2D Echo, Cardiac Doppler and Color Doppler Indications:    CHF-Acute Systolic I50.21  History:        Patient has prior history of Echocardiogram examinations, most                  recent 06/09/2022. CHF, Arrythmias:Atrial Fibrillation; Risk                 Factors:Hypertension, Dyslipidemia and Former Smoker.  Sonographer:    Celesta Gentile RCS Referring Phys: [id number] OLADAPO ADEFESO IMPRESSIONS  1. Left ventricular ejection fraction, by  estimation, is 30 to 35%. The left ventricle has moderately decreased function. The left ventricle demonstrates global hypokinesis. The left ventricular internal cavity size was severely dilated. There is mild concentric left ventricular hypertrophy. Left ventricular diastolic parameters are consistent with Grade I diastolic dysfunction (impaired relaxation). Elevated left atrial pressure.  2. Right ventricular systolic function is low normal. The right ventricular size is normal.  3. Left atrial size was moderately dilated.  4. Right atrial size was mildly dilated.  5. The mitral valve is normal in structure. Trivial mitral valve regurgitation.  6. The aortic valve is tricuspid. Aortic valve regurgitation is not visualized. Comparison(s): The left ventricular function is worsened. FINDINGS  Left Ventricle: Left ventricular ejection fraction, by estimation, is 30 to 35%. The left ventricle has moderately decreased function. The left ventricle demonstrates global hypokinesis. The left ventricular internal cavity size was severely dilated. There is mild concentric left ventricular hypertrophy. Left ventricular diastolic parameters are consistent with Grade I diastolic dysfunction (impaired relaxation). Elevated left atrial pressure. Right Ventricle: The right ventricular size is normal. Right vetricular wall thickness was not assessed. Right ventricular systolic function is low normal. Left Atrium: Left atrial size was moderately dilated. Right Atrium: Right atrial size was mildly dilated. Pericardium: There is no evidence of pericardial effusion. Mitral Valve: The mitral valve is normal in structure. Trivial mitral valve regurgitation. Tricuspid Valve: The  tricuspid valve is normal in structure. Tricuspid valve regurgitation is trivial. Aortic Valve: The aortic valve is tricuspid. Aortic valve regurgitation is not visualized. Pulmonic Valve: The pulmonic valve was normal in structure. Pulmonic valve regurgitation is not visualized. Aorta: The aortic root is normal in size and structure. IAS/Shunts: No atrial level shunt detected by color flow Doppler.  LEFT VENTRICLE PLAX 2D LVIDd:         6.40 cm      Diastology LVIDs:         4.90 cm      LV e' medial:    3.44 cm/s LV PW:         1.40 cm      LV E/e' medial:  15.4 LV IVS:        1.40 cm      LV e' lateral:   4.40 cm/s LVOT diam:     2.40 cm      LV E/e' lateral: 12.0 LV SV:         72 LV SV Index:   26 LVOT Area:     4.52 cm  LV Volumes (MOD) LV vol d, MOD A2C: 162.0 ml LV vol d, MOD A4C: 249.0 ml LV vol s, MOD A2C: 104.0 ml LV vol s, MOD A4C: 172.5 ml LV SV MOD A2C:     58.0 ml LV SV MOD A4C:     249.0 ml LV SV MOD BP:      68.1 ml RIGHT VENTRICLE RV S prime:     15.40 cm/s TAPSE (M-mode): 2.9 cm LEFT ATRIUM              Index        RIGHT ATRIUM           Index LA diam:        5.60 cm  2.00 cm/m   RA Area:     23.80 cm LA Vol (A2C):   101.0 ml 36.09 ml/m  RA Volume:   82.60 ml  29.52 ml/m LA Vol (A4C):   129.0 ml 46.10 ml/m LA Biplane Vol: 120.0  ml 42.88 ml/m  AORTIC VALVE LVOT Vmax:   81.50 cm/s LVOT Vmean:  61.200 cm/s LVOT VTI:    0.160 m  AORTA Ao Root diam: 4.00 cm MITRAL VALVE MV Area (PHT): 2.34 cm    SHUNTS MV Decel Time: 324 msec    Systemic VTI:  0.16 m MV E velocity: 52.90 cm/s  Systemic Diam: 2.40 cm MV A velocity: 70.30 cm/s MV E/A ratio:  0.75 Dietrich Pates MD Electronically signed by Dietrich Pates MD Signature Date/Time: 12/28/2022/3:36:23 PM    Final    DG Chest 2 View  Result Date: 12/28/2022 CLINICAL DATA:  Shortness of breath, cough, chest pain EXAM: CHEST - 2 VIEW COMPARISON:  12/11/2022 FINDINGS: Heart is upper limits normal in size. Mild vascular congestion. No confluent opacities,  effusions or edema. No acute bony abnormality. IMPRESSION: Mild vascular congestion. Electronically Signed   By: Charlett Nose M.D.   On: 12/28/2022 00:17   SLEEP STUDY DOCUMENTS  Result Date: 12/02/2022 Ordered by an unspecified provider.   Catarina Hartshorn, DO  [facility]  If 7PM-7AM, please contact night-coverage [email or web address] Password [email or web address] 12/29/2022, 5:03 PM   LOS: 1 day

## 2022-12-29 NOTE — TOC Benefit Eligibility Note (Signed)
Patient Product/process development scientist completed.    The patient is currently admitted and upon discharge could be taking Jardiance 10 mg.  The current 30 day co-pay is $419.34 due to a $545.00 deductible.   The patient is currently admitted and upon discharge could be taking Farxiga 10 mg.  Not on Formulary  The patient is currently admitted and upon discharge could be taking Entresto 24-26 mg.  The current 30 day co-pay is $431.92 due to a $545.00 deductible.   The patient is insured through [employer] Part D   This test claim was processed through [facility]- copay amounts may vary at other pharmacies due to pharmacy/plan contracts, or as the patient moves through the different stages of their insurance plan.  Roland Earl, CPHT Pharmacy Patient Advocate Specialist [facility] Patient Advocate Team Direct Number: [phone]  Fax: [phone]

## 2022-12-29 NOTE — Consult Note (Addendum)
Cardiology Consultation   Patient ID: Robert Warren MRN: [id number]; DOB: 1946-01-12  Admit date: 12/27/2022 Date of Consult: 12/29/2022  PCP:  Ignatius Specking, MD   [facility] Providers Cardiologist:  Thurmon Fair, MD Sleep Medicine: Dr. Tresa Endo  Patient Profile:   Robert Warren is a 77 y.o. male with a hx of paroxysmal atrial fibrillation, PVC's, abnormal stress test (NST in 05/2022 showing evidence of prior inferior infarct but no ischemia), HTN, HLD and OSA (on CPAP) who is being seen 12/29/2022 for the evaluation of CHF at the request of Dr. Thomes Dinning.  History of Present Illness:   Robert Warren was last examined by Dr. Royann Shivers in 09/2022 and reported occasional episodes of dyspnea at rest but denied any recent exertional symptoms. He was having frequent PVC's and it was felt that he may respond better to beta-blocker therapy as compared to Verapamil, therefore Verapamil was discontinued and he was switched to Toprol-XL 50 mg daily. Was recommended to overall be on a lower dose given a history of junctional bradycardia on higher doses of AV nodal blocking agents. Notes also mentioned that he was compliant with anticoagulation but I am unable to see where he was on anticoagulation at that time (previously on Eliquis 5mg  BID by review of prior medication lists).   He presented to [facility] ED on 12/27/2022 for evaluation of worsening dyspnea at rest and with activity. In talking with the patient today, he reports worsening dyspnea at rest and with activity for the past few months. Notices mostly when being active around his home and helping take care of his grandchildren. He also reports orthopnea at night but no specific PND, abdominal distention or lower extremity edema. Reports occasional episodes of chest tightness when he cannot catch his breath and this occurs with activity and resolves with rest. Still has occasional palpitations and he does use a Kardia monitor at home to see  if he is in sinus rhythm or atrial fibrillation.  Initial labs showed WBC 7.1, Hgb 14.5, platelets 184, Na+ 138, K+ 4.1 and creatinine 1.16 (close to baseline). BNP was elevated to 1870. Negative for COVID and Influenza. CXR showing mild vascular congestion. EKG showing NSR, HR 60 with 1st degree AV block and slight TWI along lateral leads. Echo this admission shows a reduced EF of 30-35% with global HK, mild LVH, Grade 1 DD, low-normal RV function and trivial MR (EF previously 50-55% in 05/2022).    He was admitted for an acute CHF exacerbation has been started on IV Lasix 40 mg twice daily. Received a total of 3 doses yesterday with a recorded net output of -2.1 L thus far. Weight at 339 lbs today (listed as 350 lbs on admission but at 360 lbs during his office visit on 09/29/2022). Labs today show his creatinine has increased to 1.40 but Lasix was reduced to once daily by the Hospitalist team already. BP has also been soft at 84/58 - 135/86 in the past 24 hours, at 107/51 on most recent check. Toprol-XL was held yesterday but did receive Lisinopril 40mg .   Past Medical History:  Diagnosis Date   Anxiety    Atrial fibrillation (HCC)    Hypertension    Shortness of breath    Sleep apnea     Past Surgical History:  Procedure Laterality Date   Anal cyst Removal     COLONOSCOPY N/A 06/20/2014   Procedure: COLONOSCOPY;  Surgeon: Malissa Hippo, MD;  Location: [facility];  Service: Endoscopy;  Laterality: N/A;  1030   COLONOSCOPY WITH PROPOFOL N/A 03/14/2021   Procedure: COLONOSCOPY WITH PROPOFOL;  Surgeon: Dolores Frame, MD;  Location: [facility];  Service: Gastroenterology;  Laterality: N/A;  10:50   CYSTOSCOPY     EXTRACORPOREAL SHOCK WAVE LITHOTRIPSY Left 08/04/2022   Procedure: EXTRACORPOREAL SHOCK WAVE LITHOTRIPSY (ESWL);  Surgeon: Malen Gauze, MD;  Location: [facility];  Service: Urology;  Laterality: Left;   GREEN LIGHT LASER TURP (TRANSURETHRAL RESECTION OF PROSTATE      POLYPECTOMY  03/14/2021   Procedure: POLYPECTOMY;  Surgeon: Dolores Frame, MD;  Location: [facility];  Service: Gastroenterology;;     Home Medications:  Prior to Admission medications   Medication Sig Start Date End Date Taking? Authorizing Provider  ARIPiprazole (ABILIFY) 15 MG tablet Take 15 mg by mouth daily. 04/22/22   [provider]  buPROPion (WELLBUTRIN SR) 200 MG 12 hr tablet Take 200 mg by mouth daily as needed (Depression). 12/10/15   [provider]  diclofenac Sodium (VOLTAREN) 1 % GEL Apply 2 g topically as needed (pain). 07/30/16   [provider]  latanoprost (XALATAN) 0.005 % ophthalmic solution 1 drop at bedtime. 09/09/21   [provider]  lisinopril (ZESTRIL) 40 MG tablet Take 40 mg by mouth daily.    [provider]  metoprolol succinate (TOPROL-XL) 50 MG 24 hr tablet Take 1 tablet (50 mg total) by mouth daily. Take with or immediately following a meal. 09/28/22 09/23/23  Croitoru, Mihai, MD  rosuvastatin (CRESTOR) 40 MG tablet Take 40 mg by mouth daily.    [provider]  tamsulosin (FLOMAX) 0.4 MG CAPS capsule Take 1 capsule (0.4 mg total) by mouth daily after supper. 08/25/22   McKenzie, Mardene Celeste, MD    Inpatient Medications: Scheduled Meds:  furosemide  40 mg Oral Daily   losartan  25 mg Oral Daily   metoprolol succinate  50 mg Oral Daily   rosuvastatin  40 mg Oral Daily   tamsulosin  0.4 mg Oral QPC supper   Continuous Infusions:  PRN Meds: acetaminophen **OR** acetaminophen, albuterol, ondansetron **OR** ondansetron (ZOFRAN) IV  Allergies:   No Known Allergies  Social History:   Social History   Socioeconomic History   Marital status: Married    Spouse name: Not on file   Number of children: Not on file   Years of education: Not on file   Highest education level: Not on file  Occupational History   Not on file  Tobacco Use   Smoking status: Former    Packs/day: 1.00    Years:  30.00    Additional pack years: 0.00    Total pack years: 30.00    Types: Cigarettes    Quit date: 08/23/1996    Years since quitting: 26.3   Smokeless tobacco: Never  Vaping Use   Vaping Use: Never used  Substance and Sexual Activity   Alcohol use: Yes    Comment: occasional   Drug use: No   Sexual activity: Yes  Other Topics Concern   Not on file  Social History Narrative   Not on file   Social Determinants of Health   Financial Resource Strain: Not on file  Food Insecurity: No Food Insecurity (12/28/2022)   Hunger Vital Sign    Worried About Running Out of Food in the Last Year: Never true    Ran Out of Food in the Last Year: Never true  Transportation Needs: No Transportation Needs (12/28/2022)  PRAPARE - Administrator, Civil Service (Medical): No    Lack of Transportation (Non-Medical): No  Physical Activity: Not on file  Stress: Not on file  Social Connections: Not on file  Intimate Partner Violence: Not At Risk (12/28/2022)   Humiliation, Afraid, Rape, and Kick questionnaire    Fear of Current or Ex-Partner: No    Emotionally Abused: No    Physically Abused: No    Sexually Abused: No    Family History:    Family History  Problem Relation Age of Onset   Heart disease Mother    Cancer - Lung Father    Heart attack Father      ROS:  Please see the history of present illness.   All other ROS reviewed and negative.     Physical Exam/Data:   Vitals:   12/28/22 2007 12/29/22 0314 12/29/22 0406 12/29/22 0953  BP: 135/86 (!) 84/58 (!) 107/51 104/75  Pulse: (!) 58 60  74  Resp: 20 20  20   Temp: 98 F (36.7 C) 98.7 F (37.1 C)  98 F (36.7 C)  TempSrc: Oral   Oral  SpO2: 97% 95%  95%  Weight:   (!) 154 kg   Height:        Intake/Output Summary (Last 24 hours) at 12/29/2022 1010 Last data filed at 12/28/2022 1636 Gross per 24 hour  Intake 240 ml  Output 1000 ml  Net -760 ml      12/29/2022    4:06 AM 12/28/2022    2:30 AM 12/27/2022   11:18 PM   Last 3 Weights  Weight (lbs) 339 lb 8.1 oz 346 lb 9 oz 350 lb  Weight (kg) 154 kg 157.2 kg 158.759 kg     Body mass index is 41.33 kg/m.  General:  Well nourished, well developed male appearing in no acute distress HEENT: normal Neck:  JVD difficult to assess secondary to body habitus.  No carotid bruits. Cardiac:  normal S1, S2; RRR; no murmur  Lungs: decreased breath sounds along bases bilaterally.  Abd: soft, nontender, no hepatomegaly  Ext: no pitting edema Musculoskeletal:  No deformities, BUE and BLE strength normal and equal Skin: warm and dry  Neuro:  CNs 2-12 intact, no focal abnormalities noted Psych:  Normal affect   EKG:  The EKG was personally reviewed and demonstrates: NSR, HR 60 with 1st degree AV block and slight TWI along lateral leads Telemetry:  Telemetry was personally reviewed and demonstrates: NSR< HR in 60's to 70's with occasional PVC's. Brief narrow-complex tachycardia this AM with HR in the 140's and lasting less than 11 seconds.   Relevant CV Studies:  Echocardiogram: 05/2022 MPRESSIONS     1. Left ventricular ejection fraction, by estimation, is 50 to 55%. The  left ventricle has low normal function. The left ventricle has no regional  wall motion abnormalities. The left ventricular internal cavity size was  moderately dilated. There is mild   concentric left ventricular hypertrophy. Left ventricular diastolic  parameters are consistent with Grade I diastolic dysfunction (impaired  relaxation).   2. Frequent PVCs noted during the study.   3. Right ventricular systolic function is normal. The right ventricular  size is normal. There is mildly elevated pulmonary artery systolic  pressure. The estimated right ventricular systolic pressure is 37.4 mmHg.   4. Left atrial size was mild to moderately dilated.   5. The mitral valve is grossly normal. Trivial mitral valve  regurgitation.   6. The aortic  valve is tricuspid. Aortic valve regurgitation is  not  visualized. Aortic valve sclerosis is present, with no evidence of aortic  valve stenosis. Aortic valve mean gradient measures 4.0 mmHg.   7. The inferior vena cava is dilated in size with >50% respiratory  variability, suggesting right atrial pressure of 8 mmHg.   Comparison(s): Prior images unable to be directly viewed.    NST: 05/2022   LV perfusion is abnormal. There is no evidence of ischemia. There is evidence of infarction. Defect 1: There is a small defect with moderate reduction in uptake present in the mid to basal inferior and inferolateral location(s) that is fixed. There is abnormal wall motion in the defect area. Consistent with infarction.   Left ventricular function is abnormal. Global function is mildly reduced. There was a single regional abnormality. Nuclear stress EF: 40 %. The left ventricular ejection fraction is moderately decreased (30-44%). End diastolic cavity size is moderately enlarged.   Findings are consistent with prior myocardial infarction. The study is intermediate risk.  Echocardiogram: 12/2021 IMPRESSIONS     1. Left ventricular ejection fraction, by estimation, is 30 to 35%. The  left ventricle has moderately decreased function. The left ventricle  demonstrates global hypokinesis. The left ventricular internal cavity size  was severely dilated. There is mild  concentric left ventricular hypertrophy. Left ventricular diastolic  parameters are consistent with Grade I diastolic dysfunction (impaired  relaxation). Elevated left atrial pressure.   2. Right ventricular systolic function is low normal. The right  ventricular size is normal.   3. Left atrial size was moderately dilated.   4. Right atrial size was mildly dilated.   5. The mitral valve is normal in structure. Trivial mitral valve  regurgitation.   6. The aortic valve is tricuspid. Aortic valve regurgitation is not  visualized.    Laboratory Data:  High Sensitivity Troponin:  No  results for input(s): "TROPONINIHS" in the last 720 hours.   Chemistry Recent Labs  Lab 12/27/22 2350 12/28/22 0526 12/29/22 0422  NA 138  --  139  K 4.1  --  3.6  CL 106  --  101  CO2 25  --  28  GLUCOSE 102*  --  117*  BUN 17  --  25*  CREATININE 1.16 1.13 1.40*  CALCIUM 8.8*  --  9.2  MG  --   --  2.1  GFRNONAA >60 >60 52*  ANIONGAP 7  --  10    Recent Labs  Lab 12/29/22 0422  PROT 7.4  ALBUMIN 3.7  AST 16  ALT 16  ALKPHOS 67  BILITOT 1.7*   Lipids No results for input(s): "CHOL", "TRIG", "HDL", "LABVLDL", "LDLCALC", "CHOLHDL" in the last 168 hours.  Hematology Recent Labs  Lab 12/27/22 2350 12/28/22 0526 12/29/22 0422  WBC 7.1 6.4 6.7  RBC 5.37 5.65 5.84*  HGB 14.5 15.1 15.6  HCT 45.6 48.2 49.5  MCV 84.9 85.3 84.8  MCH 27.0 26.7 26.7  MCHC 31.8 31.3 31.5  RDW 15.8* 15.6* 15.9*  PLT 184 181 210   Thyroid No results for input(s): "TSH", "FREET4" in the last 168 hours.  BNP Recent Labs  Lab 12/27/22 2350  BNP 1,870.0*    DDimer No results for input(s): "DDIMER" in the last 168 hours.   Radiology/Studies:    DG Chest 2 View  Result Date: 12/28/2022 CLINICAL DATA:  Shortness of breath, cough, chest pain EXAM: CHEST - 2 VIEW COMPARISON:  12/11/2022 FINDINGS: Heart is upper limits normal in  size. Mild vascular congestion. No confluent opacities, effusions or edema. No acute bony abnormality. IMPRESSION: Mild vascular congestion. Electronically Signed   By: Charlett Nose M.D.   On: 12/28/2022 00:17     Assessment and Plan:   1. Acute HFrEF/History of Abnormal NST - He is currently admitted for an acute CHF exacerbation and repeat echocardiogram shows his EF is reduced at 30 to 35% which is new as his EF was previously 50 to 55% in 05/2022. NST in 05/2022 did show evidence of prior inferior infarct but no current ischemia and medical management was pursued at that time given no anginal symptoms. - BNP was elevated to 1870 on admission and he is responding  well to IV diuresis.  Given that he received 3 doses of IV Lasix yesterday and creatinine did increase to 1.40, agree with giving 40 mg x 1 today and reassessing. - He has been continued on Toprol-XL 50 mg daily and given his reduced EF, will stop Lisinopril and switch to Losartan. His last dose of Lisinopril was on 12/28/2022, therefore once 36 hours out could switch to Entresto 24-26 mg twice daily if BP allows. Would ultimately plan to add an SGLT2 inhibitor as well. - Given his new cardiomyopathy, prior abnormal stress test and symptoms concerning for accelerating angina, would recommend a R/LHC this admission once volume status has improved.   2. Paroxysmal Atrial Fibrillation - He has a known history of this and did have brief tachycardia on telemetry this morning which resolved within 11 seconds and could have been atrial fibrillation or SVT but appeared to be a regular rhythm. He has been continued on Toprol-XL 50 mg daily. - He is on Eliquis 5mg  BID. Will hold for now in anticipation of cardiac catheterization this admission. Doubt he will need bridging with Heparin but will review with Dr. Wyline Mood.   3. HTN - BP has been soft this admission, at 104/75 on most recent check. Will stop Lisinopril as discussed above and switch to Losartan. Continue Toprol-XL 50 mg daily but this may need to be reduced to 25 mg daily pending his BP trend.  4. HLD - Followed by his PCP as an outpatient. Remains on Crestor 40mg  daily.   5. OSA - He does report compliance with his CPAP.    For questions or updates, please contact [facility] Please consult [email or web address] for contact info under    Signed, Ellsworth Lennox, PA-C  12/29/2022 10:10 AM  Attending note  Patient seen and discussed with PA Iran Ouch, I agree with her documentation. 77 yo male history of afib, hyperlipidmeia, HTN, obesity, OSA, bradycardia, PVCs, presented with DOE and orthopnea.    WBC 7.1 Hgb 14.5 Plt 184 K 4.1 Cr 1.16  BUN 17 BNP 1870  CXR mild congestion EKG SR, nonspecific ST/T changes 12/2022 echo: LVE 30-35%, grade I dd, low normal RV   05/2022 echo: LVE 50-55%, no WMAs, grade I dd. 05/2022 nuclear stress: mid to basal inferior /inferolateral defect consistent with prior infarct.    1.Acute HFrEF - 05/2022 echo: LVE 50-55%, no WMAs, grade I dd. - 12/2022 echo: LVE 30-35%, grade I dd, low normal RV - CXR mild congesiton, BNP 1870  - I/Os incomplete yesterday, negative at least 2 L since admission. Weights would suggest down 11 lbs since admission.  Received IV lasix 40mg  x 3 yesterday, appears already changed to oral received 40mg  today. Uptrend in Cr to 1.4 today.  - reds vest 32% - difficult to assess  volume status by exam given body habitus. Reds vest and rise in Cr would suggest euvolemic to dry. Agree with oral lasix for now, f/u RHC numbers  - medical therapy limited by chronic bradycardia.On toprol 50mg  daily, would not titrate further. Appears held last 2 days due to soft bp's, lower dose to 25mg  and adjust hold parameters - agree with changing lisinopril to losartan, last lisinopril dose 5/6. Could transition to entresto later in admit pending renal function and bp's - start farxiga 10mg  daily - likely low dose aldactone in next few days. - will need LHC/RHC this admit. Last eliquis dose 12/29/22 in AM. If Cr trending down tomorrow would likely transfer Wednesday with plans for cath Thursday.   2.PAF - continue toprol. Was held last 2 days based on hold parameters, low dose to 25mg  and adjusted hold parameters - hold eliquis with plans for cath, do not have to bridge with heparin.     Dina Rich MD

## 2022-12-29 NOTE — Progress Notes (Signed)
   12/29/22 1000  ReDS Vest / Clip  Station Marker D  Ruler Value 46  ReDS Value Range < 36  ReDS Actual Value 32

## 2022-12-29 NOTE — Discharge Instructions (Signed)

## 2022-12-30 DIAGNOSIS — I5020 Unspecified systolic (congestive) heart failure: Secondary | ICD-10-CM

## 2022-12-30 DIAGNOSIS — I480 Paroxysmal atrial fibrillation: Secondary | ICD-10-CM | POA: Diagnosis not present

## 2022-12-30 DIAGNOSIS — I5033 Acute on chronic diastolic (congestive) heart failure: Secondary | ICD-10-CM

## 2022-12-30 DIAGNOSIS — G4733 Obstructive sleep apnea (adult) (pediatric): Secondary | ICD-10-CM | POA: Diagnosis not present

## 2022-12-30 LAB — BASIC METABOLIC PANEL
Anion gap: 7 (ref 5–15)
BUN: 26 mg/dL — ABNORMAL HIGH (ref 8–23)
CO2: 28 mmol/L (ref 22–32)
Calcium: 8.8 mg/dL — ABNORMAL LOW (ref 8.9–10.3)
Chloride: 103 mmol/L (ref 98–111)
Creatinine, Ser: 1.33 mg/dL — ABNORMAL HIGH (ref 0.61–1.24)
GFR, Estimated: 56 mL/min — ABNORMAL LOW (ref >60)
Glucose, Bld: 102 mg/dL — ABNORMAL HIGH (ref 70–99)
Potassium: 3.5 mmol/L (ref 3.5–5.1)
Sodium: 138 mmol/L (ref 135–145)

## 2022-12-30 LAB — MAGNESIUM: Magnesium: 2.2 mg/dL (ref 1.7–2.4)

## 2022-12-30 MED ORDER — SODIUM CHLORIDE 0.9% FLUSH: 3.0000 mL | INTRAVENOUS | Status: DC | PRN

## 2022-12-30 MED ORDER — SODIUM CHLORIDE 0.9% FLUSH
3.0000 mL | Freq: Two times a day (BID) | INTRAVENOUS | Status: DC
  Administered 2022-12-30 – 2022-12-31 (×2): 3 mL via INTRAVENOUS

## 2022-12-30 MED ORDER — SODIUM CHLORIDE 0.9 % IV SOLN: 250.0000 mL | INTRAVENOUS | Status: DC | PRN

## 2022-12-30 MED ORDER — SODIUM CHLORIDE 0.9 % IV SOLN: INTRAVENOUS | Status: DC

## 2022-12-30 MED ORDER — ASPIRIN 81 MG PO CHEW
81.0000 mg | CHEWABLE_TABLET | ORAL | Status: AC
  Administered 2022-12-31: 81 mg via ORAL
  Filled 2022-12-30: qty 1

## 2022-12-30 MED ORDER — POTASSIUM CHLORIDE CRYS ER 20 MEQ PO TBCR
20.0000 meq | EXTENDED_RELEASE_TABLET | Freq: Once | ORAL | Status: AC
  Administered 2022-12-30: 20 meq via ORAL
  Filled 2022-12-30: qty 1

## 2022-12-30 NOTE — Progress Notes (Signed)
Report given to [place], RN at [facility]. Pt is resting in room awaiting transport. Will continue to monitor.

## 2022-12-30 NOTE — Progress Notes (Addendum)
 Rounding Note    Patient Name: Robert Warren Date of Encounter: 12/30/2022  [facility] Cardiologist: Mihai Croitoru, MD   Subjective   Breathing improved. Using his CPAP at night. Reports still having an occasional "funny feeling" in his chest. No specific pain.   Inpatient Medications    Scheduled Meds:  empagliflozin  10 mg Oral Daily   furosemide  40 mg Oral Daily   losartan  25 mg Oral Daily   metoprolol succinate  25 mg Oral Daily   rosuvastatin  40 mg Oral Daily   tamsulosin  0.4 mg Oral QPC supper    PRN Meds: acetaminophen **OR** acetaminophen, albuterol, ondansetron **OR** ondansetron (ZOFRAN) IV   Vital Signs    Vitals:   12/29/22 0953 12/29/22 1327 12/29/22 2126 12/30/22 0413  BP: 104/75 117/85 104/74 106/74  Pulse: 74 (!) 58 (!) 56 84  Resp: 20   (!) 22  Temp: 98 F (36.7 C) (!) 97.5 F (36.4 C) 97.9 F (36.6 C) 97.8 F (36.6 C)  TempSrc: Oral Oral Oral   SpO2: 95% 98% 95% 98%  Weight:    (!) 155.3 kg  Height:        Intake/Output Summary (Last 24 hours) at 12/30/2022 0756 Last data filed at 12/29/2022 1831 Gross per 24 hour  Intake 960 ml  Output 1100 ml  Net -140 ml      12/30/2022    4:13 AM 12/29/2022    4:06 AM 12/28/2022    2:30 AM  Last 3 Weights  Weight (lbs) 342 lb 5.9 oz 339 lb 8.1 oz 346 lb 9 oz  Weight (kg) 155.298 kg 154 kg 157.2 kg      Telemetry    NSR, HR in 60's to 70's with occasional PVC's.  - Personally Reviewed  ECG    No new tracings.   Physical Exam   GEN: Pleasant male appearing in no acute distress.   Neck: Difficult to assess JVD secondary to body habitus but does not appear elevated.  Cardiac: RRR, no murmurs, rubs, or gallops.  Respiratory: Breath sounds slightly decreased along bases bilaterally.  GI: Soft, nontender, non-distended  MS: No pitting edema; No deformity. Neuro:  Nonfocal  Psych: Normal affect   Labs    Chemistry Recent Labs  Lab 12/27/22 2350 12/28/22 0526 12/29/22 0422  12/30/22 0422  NA 138  --  139 138  K 4.1  --  3.6 3.5  CL 106  --  101 103  CO2 25  --  28 28  GLUCOSE 102*  --  117* 102*  BUN 17  --  25* 26*  CREATININE 1.16 1.13 1.40* 1.33*  CALCIUM 8.8*  --  9.2 8.8*  MG  --   --  2.1 2.2  PROT  --   --  7.4  --   ALBUMIN  --   --  3.7  --   AST  --   --  16  --   ALT  --   --  16  --   ALKPHOS  --   --  67  --   BILITOT  --   --  1.7*  --   GFRNONAA >60 >60 52* 56*  ANIONGAP 7  --  10 7    Hematology Recent Labs  Lab 12/27/22 2350 12/28/22 0526 12/29/22 0422  WBC 7.1 6.4 6.7  RBC 5.37 5.65 5.84*  HGB 14.5 15.1 15.6  HCT 45.6 48.2 49.5  MCV 84.9 85.3 84.8    MCH 27.0 26.7 26.7  MCHC 31.8 31.3 31.5  RDW 15.8* 15.6* 15.9*  PLT 184 181 210   BNP Recent Labs  Lab 12/27/22 2350  BNP 1,870.0*     Cardiac Studies   Echocardiogram: 12/28/2022 IMPRESSIONS    1. Left ventricular ejection fraction, by estimation, is 30 to 35%. The  left ventricle has moderately decreased function. The left ventricle  demonstrates global hypokinesis. The left ventricular internal cavity size  was severely dilated. There is mild  concentric left ventricular hypertrophy. Left ventricular diastolic  parameters are consistent with Grade I diastolic dysfunction (impaired  relaxation). Elevated left atrial pressure.   2. Right ventricular systolic function is low normal. The right  ventricular size is normal.   3. Left atrial size was moderately dilated.   4. Right atrial size was mildly dilated.   5. The mitral valve is normal in structure. Trivial mitral valve  regurgitation.   6. The aortic valve is tricuspid. Aortic valve regurgitation is not  visualized.   Comparison(s): The left ventricular function is worsened.   Patient Profile     77 y.o. male w/ PMH of paroxysmal atrial fibrillation, PVC's, abnormal stress test (NST in 05/2022 showing evidence of prior inferior infarct but no ischemia), HTN, HLD and OSA (on CPAP) who is currently  admitted for an acute CHF exacerbation and found to have a new cardiomyopathy with EF of 30-35%.   Assessment & Plan    1. Acute HFrEF/History of Abnormal NST - Echo this admission shows a newly reduced EF of 30 to 35% as this was previously 50 to 55% in 05/2022. NST in 05/2022 did show evidence of prior inferior infarct but no current ischemia and medical management was pursued at that time given no anginal symptoms. - He responded well to IV Lasix with a net output of -2.8 L thus far and weight at 342 lbs this AM which is close to his known baseline. He has already been switched to PO Lasix 40mg daily given his rising creatinine yesterday and ReDS Vest reading at 32%. Creatinine improved today from 1.40 to 1.33. - For GDMT, he has been continued on Toprol-XL but reduced to 25mg daily given soft BP. Received Lisinopril on 5/6 and was switched to Losartan yesterday. Pending BP trend, could switch to Entresto tomorrow following his catheterization. He has also been started on Jardiance 10mg daily. Will need patient assistance as an outpatient as this is going to cost > $400 given his deductible. Can also add Spironolactone pending BP trend.   - Given improvement in his volume status, will plan for R/LHC tomorrow following wash-out of Eliquis. The patient understands that risks include but are not limited to stroke (1 in 1000), death (1 in 1000), kidney failure [usually temporary] (1 in 500), bleeding (1 in 200), allergic reaction [possibly serious] (1 in 200). Will arrange transfer today if a bed is available. Will be on the Cardiology service once at [place]. Will update the Card Master and Hospitalist team.     2. Paroxysmal Atrial Fibrillation - Maintaining NSR at this time. Did have brief tachycardia on 5/7 for less than 11 seconds but no recurrence. Continue Toprol-XL but dosing has been reduced to 25mg daily given soft BP.  - Was on Eliquis 5mg BID prior to admission with last dose being on  12/29/2022 at 1000. Not being bridged with Heparin. Would restart Eliquis after his catheterization.   3. HTN - BP has been soft this admission, at 104/74 -   117/85 in the past 24 hours. Was on Lisinopril 40mg daily and this was switched to Losartan 25mg daily. Toprol-XL also reduced from 50mg daily to 25 mg daily.    4. HLD - Followed by his PCP as an outpatient. Will recheck an FLP. Remains on Crestor 40mg daily.    5. OSA - He has been compliant with his CPAP at home and is using this admission as well.  For questions or updates, please contact [facility] Please consult [email or web address] for contact info under      Signed, Brittany M Strader, PA-C  12/30/2022, 7:56 AM     Attending note:  Patient seen and examined.  I reviewed the interval chart and agree with above assessment by Ms. Strader PA-C.  Mr. Poehlman states that he feels better, no chest tightness and breathing is at baseline at rest.  On examination he appears comfortable.  Afebrile, heart rate in the 70s to 80s in atrial fibrillation, blood pressure 106/74.  He has had a net output of 2.8 L on IV Lasix.  Lungs clear anteriorly, cardiac exam with irregularly irregular rhythm and no obvious gallop.  Pertinent lab work includes potassium 3.5, BUN 26, creatinine 1.33 down from 1.40, hemoglobin 15.6, platelets 210.  HFrEF with acute presentation, LVEF 30 to 35% at this point with global hypokinesis and significant LV dilatation.  RV contraction normal.  Prior noninvasive cardiac ischemic testing indicated suspected inferior infarct but no active ischemia.  Plan at this time is a right and left heart catheterization for further evaluation and adjust GDMT as tolerated.  He is in atrial fibrillation now with heart rate control, Eliquis held in anticipation of procedure for tomorrow.  For now would continue Toprol-XL, Jardiance, Cozaar, and Crestor.  Further adjustments can be made after cardiac catheterization.  Zamir Staples G.  Tomeika Weinmann, M.D., F.A.C.C.  

## 2022-12-30 NOTE — Progress Notes (Signed)
PROGRESS NOTE  Robert Warren ZOX:[id number] DOB: Apr 18, 1946 DOA: 12/27/2022 PCP: Ignatius Specking, MD  Brief History54  77 year old male with a history of atrial fibrillation, OSA, morbid obesity, hypertension, hyperlipidemia, bradycardia, BPH presenting with about 6-week history of shortness of breath.  The patient states that he has been following his PCP.  He was diagnosed with bronchitis and given an antibiotic injection and started on prednisone.  There was no significant improvement.  He was given an additional course of prednisone which she finished about a week prior to this admission.  He states that his cough is somewhat better but he continued to have shortness of breath.  The patient complained of increasing abdominal girth and orthopnea type symptoms.  He denies any fevers, chills, headache, neck pain, nausea, vomiting, diarrhea, abdominal pain.  He did have some chest discomfort.  There is no hematochezia or melena. In the ED, the patient was afebrile hemodynamically stable with oxygen saturation 100% room air.  BMP showed sodium 138, potassium 4.1, bicarbonate 25, serum creatinine 1.16.  WBC 7.1, hemoglobin 14.5, platelets 1-84,000.  BNP was 1870.  Chest x-ray showed vascular congestion.  EKG showed sinus rhythm with nonspecific T wave changes.  The patient was started on intravenous furosemide with clinical improvement, and he was transitioned to po furosemide.  Echo showed EF 30-35%.  Cardiology was consulted and GDMT was added.  Plan is for heart cath when stable.   Assessment/Plan: Acute HFrEF -06/09/2022 echo EF 50-55%, no WMA, grade 1 DD, normal RVF -12/28/22 Echo EF 30-35%, global HK, G1DD -Accurate I's and O's -Daily weights -Continue IV furosemide 40 mg IV twice daily>>po lasix -Obtain ReDS--34 -appreciate cardiology consult>>plan for LHC/RHC this admission -Farxiga/losartan/metoprolol succinate started -cardiology team transferring to [facility] under cardio service for cath  tomorrow (5/9)    Paroxysmal atrial fibrillation -Currently in sinus rhythm -Rate controlled -Continue apixaban   OSA -Continue CPAP while inpatient   Bradycardia -Dr. Royann Shivers switch patient from verampamil to metoprolol succinate 50mg  daily on 09/28/22--decreased to 25 mg  -Patient has a history of junctional bradycardia -Avoid high doses of negative chronotropic agents   Essential hypertension -Continue metoprolol succinate   Morbid obesity -BMI 42.18 -Lifestyle modification   BPH -continue tamulosin     Family Communication:  no Family at bedside   Consultants:  cardiology   Code Status:  FULL    DVT Prophylaxis:apixaban  Disposition: Transfer to [facility] for cath under cardiology service     Procedures: As Listed in Progress Note Above   Antibiotics: None   Subjective: Reports feeling a lot better, no pressure in chest and has been urinating frequently.  He is agreeable to go to [facility] for cath.   Objective: Vitals:   12/29/22 1327 12/29/22 2126 12/30/22 0413 12/30/22 1235  BP: 117/85 104/74 106/74 (!) 100/52  Pulse: (!) 58 (!) 56 84 75  Resp:   (!) 22 20  Temp: (!) 97.5 F (36.4 C) 97.9 F (36.6 C) 97.8 F (36.6 C) 98.4 F (36.9 C)  TempSrc: Oral Oral  Oral  SpO2: 98% 95% 98% 97%  Weight:   (!) 155.3 kg   Height:        Intake/Output Summary (Last 24 hours) at 12/30/2022 1414 Last data filed at 12/30/2022 0830 Gross per 24 hour  Intake 780 ml  Output 900 ml  Net -120 ml   Weight change: 1.298 kg Exam:  General:  morbidly obese male, awake, alert, calm,  cooperative, NAD HEENT: No icterus, No thrush, No neck mass, [place]/AT Cardiovascular: normal S1/S2, no rubs, no gallops Respiratory: fine bibasilar crackles.  No wheeze. No increased WOB Abdomen: Soft/+BS, non tender, non distended, no guarding Extremities: 1+ pitting lower extremity edema, No lymphangitis, No petechiae, No rashes, no synovitis   Data Reviewed: I have personally reviewed following  labs and imaging studies Basic Metabolic Panel: Recent Labs  Lab 12/27/22 2350 12/28/22 0526 12/29/22 0422 12/30/22 0422  NA 138  --  139 138  K 4.1  --  3.6 3.5  CL 106  --  101 103  CO2 25  --  28 28  GLUCOSE 102*  --  117* 102*  BUN 17  --  25* 26*  CREATININE 1.16 1.13 1.40* 1.33*  CALCIUM 8.8*  --  9.2 8.8*  MG  --   --  2.1 2.2  PHOS  --   --  4.0  --    Liver Function Tests: Recent Labs  Lab 12/29/22 0422  AST 16  ALT 16  ALKPHOS 67  BILITOT 1.7*  PROT 7.4  ALBUMIN 3.7   No results for input(s): "LIPASE", "AMYLASE" in the last 168 hours. No results for input(s): "AMMONIA" in the last 168 hours. Coagulation Profile: No results for input(s): "INR", "PROTIME" in the last 168 hours. CBC: Recent Labs  Lab 12/27/22 2350 12/28/22 0526 12/29/22 0422  WBC 7.1 6.4 6.7  HGB 14.5 15.1 15.6  HCT 45.6 48.2 49.5  MCV 84.9 85.3 84.8  PLT 184 181 210   Cardiac Enzymes: No results for input(s): "CKTOTAL", "CKMB", "CKMBINDEX", "TROPONINI" in the last 168 hours. BNP: Invalid input(s): "POCBNP" CBG: No results for input(s): "GLUCAP" in the last 168 hours. HbA1C: No results for input(s): "HGBA1C" in the last 72 hours. Urine analysis:    Component Value Date/Time   COLORURINE AMBER (A) 04/03/2022 1409   APPEARANCEUR Clear 10/05/2022 1103   LABSPEC 1.025 04/03/2022 1409   PHURINE 5.0 04/03/2022 1409   GLUCOSEU Negative 10/05/2022 1103   HGBUR LARGE (A) 04/03/2022 1409   BILIRUBINUR Negative 10/05/2022 1103   KETONESUR NEGATIVE 04/03/2022 1409   PROTEINUR 3+ (A) 10/05/2022 1103   PROTEINUR >=300 (A) 04/03/2022 1409   NITRITE Negative 10/05/2022 1103   NITRITE NEGATIVE 04/03/2022 1409   LEUKOCYTESUR Negative 10/05/2022 1103   LEUKOCYTESUR MODERATE (A) 04/03/2022 1409   Recent Results (from the past 240 hour(s))  Resp panel by RT-PCR (RSV, Flu A&B, Covid) Anterior Nasal Swab     Status: None   Collection Time: 12/28/22 12:18 AM   Specimen: Anterior Nasal Swab   Result Value Ref Range Status   SARS Coronavirus 2 by RT PCR NEGATIVE NEGATIVE Final    Comment: (NOTE) SARS-CoV-2 target nucleic acids are NOT DETECTED.  The SARS-CoV-2 RNA is generally detectable in upper respiratory specimens during the acute phase of infection. The lowest concentration of SARS-CoV-2 viral copies this assay can detect is 138 copies/mL. A negative result does not preclude SARS-Cov-2 infection and should not be used as the sole basis for treatment or other patient management decisions. A negative result may occur with  improper specimen collection/handling, submission of specimen other than nasopharyngeal swab, presence of viral mutation(s) within the areas targeted by this assay, and inadequate number of viral copies(<138 copies/mL). A negative result must be combined with clinical observations, patient history, and epidemiological information. The expected result is Negative.  Fact Sheet for Patients:  BloggerCourse.com  Fact Sheet for Healthcare Providers:  SeriousBroker.it  This  test is no t yet approved or cleared by the [place] and  has been authorized for detection and/or diagnosis of SARS-CoV-2 by FDA under an Emergency Use Authorization (EUA). This EUA will remain  in effect (meaning this test can be used) for the duration of the COVID-19 declaration under Section 564(b)(1) of the Act, 21 U.S.C.section 360bbb-3(b)(1), unless the authorization is terminated  or revoked sooner.       Influenza A by PCR NEGATIVE NEGATIVE Final   Influenza B by PCR NEGATIVE NEGATIVE Final    Comment: (NOTE) The Xpert Xpress SARS-CoV-2/FLU/RSV plus assay is intended as an aid in the diagnosis of influenza from Nasopharyngeal swab specimens and should not be used as a sole basis for treatment. Nasal washings and aspirates are unacceptable for Xpert Xpress SARS-CoV-2/FLU/RSV testing.  Fact Sheet for  Patients: BloggerCourse.com  Fact Sheet for Healthcare Providers: SeriousBroker.it  This test is not yet approved or cleared by the [place] FDA and has been authorized for detection and/or diagnosis of SARS-CoV-2 by FDA under an Emergency Use Authorization (EUA). This EUA will remain in effect (meaning this test can be used) for the duration of the COVID-19 declaration under Section 564(b)(1) of the Act, 21 U.S.C. section 360bbb-3(b)(1), unless the authorization is terminated or revoked.     Resp Syncytial Virus by PCR NEGATIVE NEGATIVE Final    Comment: (NOTE) Fact Sheet for Patients: BloggerCourse.com  Fact Sheet for Healthcare Providers: SeriousBroker.it  This test is not yet approved or cleared by the [place] FDA and has been authorized for detection and/or diagnosis of SARS-CoV-2 by FDA under an Emergency Use Authorization (EUA). This EUA will remain in effect (meaning this test can be used) for the duration of the COVID-19 declaration under Section 564(b)(1) of the Act, 21 U.S.C. section 360bbb-3(b)(1), unless the authorization is terminated or revoked.  Performed at [facility], [address], [place], [place] [address]      Scheduled Meds:  empagliflozin  10 mg Oral Daily   furosemide  40 mg Oral Daily   losartan  25 mg Oral Daily   metoprolol succinate  25 mg Oral Daily   rosuvastatin  40 mg Oral Daily   tamsulosin  0.4 mg Oral QPC supper   Continuous Infusions:  Procedures/Studies: ECHOCARDIOGRAM COMPLETE  Result Date: 12/28/2022    ECHOCARDIOGRAM REPORT   Patient Name:   Robert Warren Date of Exam: 12/28/2022 Medical Rec #:  [id number]       Height:       76.0 in Accession #:    [id number]      Weight:       346.6 lb Date of Birth:  May 22, 1946       BSA:          2.798 m Patient Age:    77 years        BP:           138/77 mmHg Patient Gender: M                HR:           52 bpm. [facility] Procedure: 2D Echo, Cardiac Doppler and Color Doppler Indications:    CHF-Acute Systolic I50.21  History:        Patient has prior history of Echocardiogram examinations, most                 recent 06/09/2022. CHF, Arrythmias:Atrial Fibrillation; Risk  Factors:Hypertension, Dyslipidemia and Former Smoker.  Sonographer:    Celesta Gentile RCS Referring Phys: [id number] OLADAPO ADEFESO IMPRESSIONS  1. Left ventricular ejection fraction, by estimation, is 30 to 35%. The left ventricle has moderately decreased function. The left ventricle demonstrates global hypokinesis. The left ventricular internal cavity size was severely dilated. There is mild concentric left ventricular hypertrophy. Left ventricular diastolic parameters are consistent with Grade I diastolic dysfunction (impaired relaxation). Elevated left atrial pressure.  2. Right ventricular systolic function is low normal. The right ventricular size is normal.  3. Left atrial size was moderately dilated.  4. Right atrial size was mildly dilated.  5. The mitral valve is normal in structure. Trivial mitral valve regurgitation.  6. The aortic valve is tricuspid. Aortic valve regurgitation is not visualized. Comparison(s): The left ventricular function is worsened. FINDINGS  Left Ventricle: Left ventricular ejection fraction, by estimation, is 30 to 35%. The left ventricle has moderately decreased function. The left ventricle demonstrates global hypokinesis. The left ventricular internal cavity size was severely dilated. There is mild concentric left ventricular hypertrophy. Left ventricular diastolic parameters are consistent with Grade I diastolic dysfunction (impaired relaxation). Elevated left atrial pressure. Right Ventricle: The right ventricular size is normal. Right vetricular wall thickness was not assessed. Right ventricular systolic function is low normal. Left Atrium: Left atrial size  was moderately dilated. Right Atrium: Right atrial size was mildly dilated. Pericardium: There is no evidence of pericardial effusion. Mitral Valve: The mitral valve is normal in structure. Trivial mitral valve regurgitation. Tricuspid Valve: The tricuspid valve is normal in structure. Tricuspid valve regurgitation is trivial. Aortic Valve: The aortic valve is tricuspid. Aortic valve regurgitation is not visualized. Pulmonic Valve: The pulmonic valve was normal in structure. Pulmonic valve regurgitation is not visualized. Aorta: The aortic root is normal in size and structure. IAS/Shunts: No atrial level shunt detected by color flow Doppler.  LEFT VENTRICLE PLAX 2D LVIDd:         6.40 cm      Diastology LVIDs:         4.90 cm      LV e' medial:    3.44 cm/s LV PW:         1.40 cm      LV E/e' medial:  15.4 LV IVS:        1.40 cm      LV e' lateral:   4.40 cm/s LVOT diam:     2.40 cm      LV E/e' lateral: 12.0 LV SV:         72 LV SV Index:   26 LVOT Area:     4.52 cm  LV Volumes (MOD) LV vol d, MOD A2C: 162.0 ml LV vol d, MOD A4C: 249.0 ml LV vol s, MOD A2C: 104.0 ml LV vol s, MOD A4C: 172.5 ml LV SV MOD A2C:     58.0 ml LV SV MOD A4C:     249.0 ml LV SV MOD BP:      68.1 ml RIGHT VENTRICLE RV S prime:     15.40 cm/s TAPSE (M-mode): 2.9 cm LEFT ATRIUM              Index        RIGHT ATRIUM           Index LA diam:        5.60 cm  2.00 cm/m   RA Area:     23.80 cm LA Vol (A2C):   101.0  ml 36.09 ml/m  RA Volume:   82.60 ml  29.52 ml/m LA Vol (A4C):   129.0 ml 46.10 ml/m LA Biplane Vol: 120.0 ml 42.88 ml/m  AORTIC VALVE LVOT Vmax:   81.50 cm/s LVOT Vmean:  61.200 cm/s LVOT VTI:    0.160 m  AORTA Ao Root diam: 4.00 cm MITRAL VALVE MV Area (PHT): 2.34 cm    SHUNTS MV Decel Time: 324 msec    Systemic VTI:  0.16 m MV E velocity: 52.90 cm/s  Systemic Diam: 2.40 cm MV A velocity: 70.30 cm/s MV E/A ratio:  0.75 Dietrich Pates MD Electronically signed by Dietrich Pates MD Signature Date/Time: 12/28/2022/3:36:23 PM    Final     DG Chest 2 View  Result Date: 12/28/2022 CLINICAL DATA:  Shortness of breath, cough, chest pain EXAM: CHEST - 2 VIEW COMPARISON:  12/11/2022 FINDINGS: Heart is upper limits normal in size. Mild vascular congestion. No confluent opacities, effusions or edema. No acute bony abnormality. IMPRESSION: Mild vascular congestion. Electronically Signed   By: Charlett Nose M.D.   On: 12/28/2022 00:17   SLEEP STUDY DOCUMENTS  Result Date: 12/02/2022 Ordered by an unspecified provider.   Standley Dakins, MD  [facility]  If 7PM-7AM, please contact night-coverage [email or web address] Password [email or web address] 12/30/2022, 2:14 PM   LOS: 2 days

## 2022-12-30 NOTE — H&P (View-Only) (Signed)
Rounding Note    Patient Name: Robert Warren Date of Encounter: 12/30/2022  [facility] Cardiologist: Thurmon Fair, MD   Subjective   Breathing improved. Using his CPAP at night. Reports still having an occasional "funny feeling" in his chest. No specific pain.   Inpatient Medications    Scheduled Meds:  empagliflozin  10 mg Oral Daily   furosemide  40 mg Oral Daily   losartan  25 mg Oral Daily   metoprolol succinate  25 mg Oral Daily   rosuvastatin  40 mg Oral Daily   tamsulosin  0.4 mg Oral QPC supper    PRN Meds: acetaminophen **OR** acetaminophen, albuterol, ondansetron **OR** ondansetron (ZOFRAN) IV   Vital Signs    Vitals:   12/29/22 0953 12/29/22 1327 12/29/22 2126 12/30/22 0413  BP: 104/75 117/85 104/74 106/74  Pulse: 74 (!) 58 (!) 56 84  Resp: 20   (!) 22  Temp: 98 F (36.7 C) (!) 97.5 F (36.4 C) 97.9 F (36.6 C) 97.8 F (36.6 C)  TempSrc: Oral Oral Oral   SpO2: 95% 98% 95% 98%  Weight:    (!) 155.3 kg  Height:        Intake/Output Summary (Last 24 hours) at 12/30/2022 0756 Last data filed at 12/29/2022 1831 Gross per 24 hour  Intake 960 ml  Output 1100 ml  Net -140 ml      12/30/2022    4:13 AM 12/29/2022    4:06 AM 12/28/2022    2:30 AM  Last 3 Weights  Weight (lbs) 342 lb 5.9 oz 339 lb 8.1 oz 346 lb 9 oz  Weight (kg) 155.298 kg 154 kg 157.2 kg      Telemetry    NSR, HR in 60's to 70's with occasional PVC's.  - Personally Reviewed  ECG    No new tracings.   Physical Exam   GEN: Pleasant male appearing in no acute distress.   Neck: Difficult to assess JVD secondary to body habitus but does not appear elevated.  Cardiac: RRR, no murmurs, rubs, or gallops.  Respiratory: Breath sounds slightly decreased along bases bilaterally.  GI: Soft, nontender, non-distended  MS: No pitting edema; No deformity. Neuro:  Nonfocal  Psych: Normal affect   Labs    Chemistry Recent Labs  Lab 12/27/22 2350 12/28/22 0526 12/29/22 0422  12/30/22 0422  NA 138  --  139 138  K 4.1  --  3.6 3.5  CL 106  --  101 103  CO2 25  --  28 28  GLUCOSE 102*  --  117* 102*  BUN 17  --  25* 26*  CREATININE 1.16 1.13 1.40* 1.33*  CALCIUM 8.8*  --  9.2 8.8*  MG  --   --  2.1 2.2  PROT  --   --  7.4  --   ALBUMIN  --   --  3.7  --   AST  --   --  16  --   ALT  --   --  16  --   ALKPHOS  --   --  67  --   BILITOT  --   --  1.7*  --   GFRNONAA >60 >60 52* 56*  ANIONGAP 7  --  10 7    Hematology Recent Labs  Lab 12/27/22 2350 12/28/22 0526 12/29/22 0422  WBC 7.1 6.4 6.7  RBC 5.37 5.65 5.84*  HGB 14.5 15.1 15.6  HCT 45.6 48.2 49.5  MCV 84.9 85.3 84.8  MCH 27.0 26.7 26.7  MCHC 31.8 31.3 31.5  RDW 15.8* 15.6* 15.9*  PLT 184 181 210   BNP Recent Labs  Lab 12/27/22 2350  BNP 1,870.0*     Cardiac Studies   Echocardiogram: 12/28/2022 IMPRESSIONS    1. Left ventricular ejection fraction, by estimation, is 30 to 35%. The  left ventricle has moderately decreased function. The left ventricle  demonstrates global hypokinesis. The left ventricular internal cavity size  was severely dilated. There is mild  concentric left ventricular hypertrophy. Left ventricular diastolic  parameters are consistent with Grade I diastolic dysfunction (impaired  relaxation). Elevated left atrial pressure.   2. Right ventricular systolic function is low normal. The right  ventricular size is normal.   3. Left atrial size was moderately dilated.   4. Right atrial size was mildly dilated.   5. The mitral valve is normal in structure. Trivial mitral valve  regurgitation.   6. The aortic valve is tricuspid. Aortic valve regurgitation is not  visualized.   Comparison(s): The left ventricular function is worsened.   Patient Profile     77 y.o. male w/ PMH of paroxysmal atrial fibrillation, PVC's, abnormal stress test (NST in 05/2022 showing evidence of prior inferior infarct but no ischemia), HTN, HLD and OSA (on CPAP) who is currently  admitted for an acute CHF exacerbation and found to have a new cardiomyopathy with EF of 30-35%.   Assessment & Plan    1. Acute HFrEF/History of Abnormal NST - Echo this admission shows a newly reduced EF of 30 to 35% as this was previously 50 to 55% in 05/2022. NST in 05/2022 did show evidence of prior inferior infarct but no current ischemia and medical management was pursued at that time given no anginal symptoms. - He responded well to IV Lasix with a net output of -2.8 L thus far and weight at 342 lbs this AM which is close to his known baseline. He has already been switched to PO Lasix 40mg  daily given his rising creatinine yesterday and ReDS Vest reading at 32%. Creatinine improved today from 1.40 to 1.33. - For GDMT, he has been continued on Toprol-XL but reduced to 25mg  daily given soft BP. Received Lisinopril on 5/6 and was switched to Losartan yesterday. Pending BP trend, could switch to [facility] tomorrow following his catheterization. He has also been started on Jardiance 10mg  daily. Will need patient assistance as an outpatient as this is going to cost > $400 given his deductible. Can also add Spironolactone pending BP trend.   - Given improvement in his volume status, will plan for [facility] tomorrow following wash-out of Eliquis. The patient understands that risks include but are not limited to stroke (1 in 1000), death (1 in 1000), kidney failure [usually temporary] (1 in 500), bleeding (1 in 200), allergic reaction [possibly serious] (1 in 200). Will arrange transfer today if a bed is available. Will be on the Cardiology service once at [facility]. Will update the Card Master and Hospitalist team.     2. Paroxysmal Atrial Fibrillation - Maintaining NSR at this time. Did have brief tachycardia on 5/7 for less than 11 seconds but no recurrence. Continue Toprol-XL but dosing has been reduced to 25mg  daily given soft BP.  - Was on Eliquis 5mg  BID prior to admission with last dose being on  12/29/2022 at 1000. Not being bridged with Heparin. Would restart Eliquis after his catheterization.   3. HTN - BP has been soft this admission, at 104/74 -  117/85 in the past 24 hours. Was on Lisinopril 40mg  daily and this was switched to Losartan 25mg  daily. Toprol-XL also reduced from 50mg  daily to 25 mg daily.    4. HLD - Followed by his PCP as an outpatient. Will recheck an FLP. Remains on Crestor 40mg  daily.    5. OSA - He has been compliant with his CPAP at home and is using this admission as well.  For questions or updates, please contact [facility] Please consult [email or web address] for contact info under      Signed, Ellsworth Lennox, PA-C  12/30/2022, 7:56 AM     Attending note:  Patient seen and examined.  I reviewed the interval chart and agree with above assessment by Ms. Strader PA-C.  Mr. Nodarse states that he feels better, no chest tightness and breathing is at baseline at rest.  On examination he appears comfortable.  Afebrile, heart rate in the 70s to 80s in atrial fibrillation, blood pressure 106/74.  He has had a net output of 2.8 L on IV Lasix.  Lungs clear anteriorly, cardiac exam with irregularly irregular rhythm and no obvious gallop.  Pertinent lab work includes potassium 3.5, BUN 26, creatinine 1.33 down from 1.40, hemoglobin 15.6, platelets 210.  HFrEF with acute presentation, LVEF 30 to 35% at this point with global hypokinesis and significant LV dilatation.  RV contraction normal.  Prior noninvasive cardiac ischemic testing indicated suspected inferior infarct but no active ischemia.  Plan at this time is a right and left heart catheterization for further evaluation and adjust GDMT as tolerated.  He is in atrial fibrillation now with heart rate control, Eliquis held in anticipation of procedure for tomorrow.  For now would continue Toprol-XL, Jardiance, Cozaar, and Crestor.  Further adjustments can be made after cardiac catheterization.  Jonelle Sidle, M.D., F.A.C.C.

## 2022-12-31 ENCOUNTER — Encounter (HOSPITAL_COMMUNITY): Payer: Self-pay | Admitting: Internal Medicine

## 2022-12-31 ENCOUNTER — Other Ambulatory Visit (HOSPITAL_COMMUNITY): Payer: Self-pay

## 2022-12-31 ENCOUNTER — Encounter (HOSPITAL_COMMUNITY)
Admission: EM | Disposition: A | Discharge status: Home / Self Care | Payer: Self-pay | Source: Home / Self Care | Attending: Internal Medicine

## 2022-12-31 DIAGNOSIS — D6869 Other thrombophilia: Secondary | ICD-10-CM

## 2022-12-31 DIAGNOSIS — I5021 Acute systolic (congestive) heart failure: Secondary | ICD-10-CM | POA: Diagnosis not present

## 2022-12-31 DIAGNOSIS — I428 Other cardiomyopathies: Secondary | ICD-10-CM | POA: Diagnosis not present

## 2022-12-31 DIAGNOSIS — I2510 Atherosclerotic heart disease of native coronary artery without angina pectoris: Secondary | ICD-10-CM | POA: Diagnosis not present

## 2022-12-31 DIAGNOSIS — I480 Paroxysmal atrial fibrillation: Secondary | ICD-10-CM | POA: Diagnosis not present

## 2022-12-31 HISTORY — PX: RIGHT/LEFT HEART CATH AND CORONARY ANGIOGRAPHY: CATH118266

## 2022-12-31 LAB — POCT I-STAT 7, (LYTES, BLD GAS, ICA,H+H)
Acid-base deficit: 1 mmol/L (ref 0.0–2.0)
Bicarbonate: 23.3 mmol/L (ref 20.0–28.0)
Calcium, Ion: 1.17 mmol/L (ref 1.15–1.40)
HCT: 44 % (ref 39.0–52.0)
Hemoglobin: 15 g/dL (ref 13.0–17.0)
O2 Saturation: 95 %
Potassium: 3.4 mmol/L — ABNORMAL LOW (ref 3.5–5.1)
Sodium: 142 mmol/L (ref 135–145)
TCO2: 24 mmol/L (ref 22–32)
pCO2 arterial: 37.5 mmHg (ref 32–48)
pH, Arterial: 7.402 (ref 7.35–7.45)
pO2, Arterial: 73 mmHg — ABNORMAL LOW (ref 83–108)

## 2022-12-31 LAB — POCT I-STAT EG7
Acid-Base Excess: 1 mmol/L (ref 0.0–2.0)
Acid-base deficit: 1 mmol/L (ref 0.0–2.0)
Bicarbonate: 24.5 mmol/L (ref 20.0–28.0)
Bicarbonate: 26.6 mmol/L (ref 20.0–28.0)
Calcium, Ion: 1.09 mmol/L — ABNORMAL LOW (ref 1.15–1.40)
Calcium, Ion: 1.19 mmol/L (ref 1.15–1.40)
HCT: 44 % (ref 39.0–52.0)
HCT: 45 % (ref 39.0–52.0)
Hemoglobin: 15 g/dL (ref 13.0–17.0)
Hemoglobin: 15.3 g/dL (ref 13.0–17.0)
O2 Saturation: 63 %
O2 Saturation: 63 %
Potassium: 3.2 mmol/L — ABNORMAL LOW (ref 3.5–5.1)
Potassium: 3.5 mmol/L (ref 3.5–5.1)
Sodium: 142 mmol/L (ref 135–145)
Sodium: 145 mmol/L (ref 135–145)
TCO2: 26 mmol/L (ref 22–32)
TCO2: 28 mmol/L (ref 22–32)
pCO2, Ven: 43 mmHg — ABNORMAL LOW (ref 44–60)
pCO2, Ven: 46.2 mmHg (ref 44–60)
pH, Ven: 7.363 (ref 7.25–7.43)
pH, Ven: 7.368 (ref 7.25–7.43)
pO2, Ven: 34 mmHg (ref 32–45)
pO2, Ven: 34 mmHg (ref 32–45)

## 2022-12-31 LAB — BASIC METABOLIC PANEL
Anion gap: 7 (ref 5–15)
BUN: 25 mg/dL — ABNORMAL HIGH (ref 8–23)
CO2: 28 mmol/L (ref 22–32)
Calcium: 9 mg/dL (ref 8.9–10.3)
Chloride: 104 mmol/L (ref 98–111)
Creatinine, Ser: 1.59 mg/dL — ABNORMAL HIGH (ref 0.61–1.24)
GFR, Estimated: 45 mL/min — ABNORMAL LOW (ref >60)
Glucose, Bld: 96 mg/dL (ref 70–99)
Potassium: 4 mmol/L (ref 3.5–5.1)
Sodium: 139 mmol/L (ref 135–145)

## 2022-12-31 LAB — LIPID PANEL
Cholesterol: 132 mg/dL (ref 0–200)
HDL: 24 mg/dL — ABNORMAL LOW (ref >40)
LDL Cholesterol: 95 mg/dL (ref 0–99)
Total CHOL/HDL Ratio: 5.5 RATIO
Triglycerides: 67 mg/dL (ref <150)
VLDL: 13 mg/dL (ref 0–40)

## 2022-12-31 LAB — SURGICAL PCR SCREEN
MRSA, PCR: NEGATIVE
Staphylococcus aureus: NEGATIVE

## 2022-12-31 SURGERY — RIGHT/LEFT HEART CATH AND CORONARY ANGIOGRAPHY
Anesthesia: LOCAL

## 2022-12-31 MED ORDER — HEPARIN SODIUM (PORCINE) 1000 UNIT/ML IJ SOLN
INTRAMUSCULAR | Status: AC
  Filled 2022-12-31: qty 10

## 2022-12-31 MED ORDER — SODIUM CHLORIDE 0.9% FLUSH
3.0000 mL | Freq: Two times a day (BID) | INTRAVENOUS | Status: DC
  Administered 2022-12-31 – 2023-01-01 (×2): 3 mL via INTRAVENOUS

## 2022-12-31 MED ORDER — HEPARIN (PORCINE) IN NACL 1000-0.9 UT/500ML-% IV SOLN
INTRAVENOUS | Status: DC | PRN
  Administered 2022-12-31 (×2): 500 mL

## 2022-12-31 MED ORDER — LIDOCAINE HCL (PF) 1 % IJ SOLN
INTRAMUSCULAR | Status: DC | PRN
  Administered 2022-12-31: 2 mL

## 2022-12-31 MED ORDER — VERAPAMIL HCL 2.5 MG/ML IV SOLN
INTRAVENOUS | Status: AC
  Filled 2022-12-31: qty 2

## 2022-12-31 MED ORDER — IOHEXOL 350 MG/ML SOLN
INTRAVENOUS | Status: DC | PRN
  Administered 2022-12-31: 45 mL

## 2022-12-31 MED ORDER — SODIUM CHLORIDE 0.9 % IV SOLN: INTRAVENOUS | Status: AC

## 2022-12-31 MED ORDER — MIDAZOLAM HCL 2 MG/2ML IJ SOLN
INTRAMUSCULAR | Status: DC | PRN
  Administered 2022-12-31: 1 mg via INTRAVENOUS

## 2022-12-31 MED ORDER — HYDRALAZINE HCL 20 MG/ML IJ SOLN: 10.0000 mg | INTRAMUSCULAR | Status: AC | PRN

## 2022-12-31 MED ORDER — FENTANYL CITRATE (PF) 100 MCG/2ML IJ SOLN
INTRAMUSCULAR | Status: DC | PRN
  Administered 2022-12-31: 25 ug via INTRAVENOUS

## 2022-12-31 MED ORDER — LIDOCAINE HCL (PF) 1 % IJ SOLN
INTRAMUSCULAR | Status: AC
  Filled 2022-12-31: qty 30

## 2022-12-31 MED ORDER — LABETALOL HCL 5 MG/ML IV SOLN: 10.0000 mg | INTRAVENOUS | Status: AC | PRN

## 2022-12-31 MED ORDER — SODIUM CHLORIDE 0.9% FLUSH: 3.0000 mL | INTRAVENOUS | Status: DC | PRN

## 2022-12-31 MED ORDER — HEPARIN SODIUM (PORCINE) 1000 UNIT/ML IJ SOLN
INTRAMUSCULAR | Status: DC | PRN
  Administered 2022-12-31: 7000 [IU] via INTRAVENOUS

## 2022-12-31 MED ORDER — VERAPAMIL HCL 2.5 MG/ML IV SOLN
INTRAVENOUS | Status: DC | PRN
  Administered 2022-12-31: 10 mL via INTRA_ARTERIAL

## 2022-12-31 MED ORDER — SODIUM CHLORIDE 0.9 % IV SOLN: 250.0000 mL | INTRAVENOUS | Status: DC | PRN

## 2022-12-31 MED ORDER — MIDAZOLAM HCL 2 MG/2ML IJ SOLN
INTRAMUSCULAR | Status: AC
  Filled 2022-12-31: qty 2

## 2022-12-31 MED ORDER — FENTANYL CITRATE (PF) 100 MCG/2ML IJ SOLN
INTRAMUSCULAR | Status: AC
  Filled 2022-12-31: qty 2

## 2022-12-31 SURGICAL SUPPLY — 13 items
CATH BALLN WEDGE 5F 110CM (CATHETERS) IMPLANT
CATH INFINITI 5 FR JL3.5 (CATHETERS) IMPLANT
CATH INFINITI JR4 5F (CATHETERS) IMPLANT
DEVICE RAD COMP TR BAND LRG (VASCULAR PRODUCTS) IMPLANT
GLIDESHEATH SLEND SS 6F .021 (SHEATH) IMPLANT
GUIDEWIRE INQWIRE 1.5J.035X260 (WIRE) IMPLANT
INQWIRE 1.5J .035X260CM (WIRE) ×1
KIT HEART LEFT (KITS) ×1 IMPLANT
PACK CARDIAC CATHETERIZATION (CUSTOM PROCEDURE TRAY) ×1 IMPLANT
SHEATH GLIDE SLENDER 4/5FR (SHEATH) IMPLANT
SHEATH PROBE COVER 6X72 (BAG) IMPLANT
TRANSDUCER W/STOPCOCK (MISCELLANEOUS) ×1 IMPLANT
TUBING CIL FLEX 10 FLL-RA (TUBING) ×1 IMPLANT

## 2022-12-31 NOTE — Progress Notes (Signed)
   Heart Failure Stewardship Pharmacist Progress Note   PCP: Ignatius Specking, MD PCP-Cardiologist: Thurmon Fair, MD   HPI:  77 YO male with a PMH of atrial fibrillation with RVR, OSA, morbid obesity, hypertension, hyperlipidemia   Patient presented to the ED on 5/5 with complaints of shortness of breath that is worse on exertion and when lying flat. Patient recently saw his PCP for these same symptoms and was prescribed antibiotics/steroids for presumed bronchitis with no significant improvement. Echo from 06/09/22 showed EF 50-55%, no wall motion abnormalities, G1DD, and normal RV function. Echo from this admission showing EF 30-35% with global hypokinesis, mild LV hypertrophy, G1DD, low-normal RV function and trivial MVR. ReDS vest reading 32%. R/LHC from 5/9 showing mild stenoses (30%) of RCA, LM, and LAD; CO/CI 5.45/1.96, RAP 5 mmHg, PAP 19 mmHg. Patient on metoprolol succinate outpatient for frequent PVCs and lisinopril--he reports compliance with both.   Current HF Medications: Diuretic: furosemide 40 mg PO daily Beta Blocker: metoprolol succinate 25 mg daily ACE/ARB/ARNI: losartan 25 mg daily SGLT2i: empagliflozin 10 mg daily  Prior to admission HF Medications: Beta blocker: metoprolol succinate 50 mg daily ACE/ARB/ARNI: lisinopril 40 mg daily  Pertinent Lab Values: Serum creatinine 1.59, BUN 25, Potassium 4.0, Sodium 139, BNP 1870, Magnesium 2.2  Vital Signs: Weight: 337 lbs (bed weight); admission weight: 350 lbs Blood pressure: 130s-140s/70s  Heart rate: 50-60s  I/O: -60mL yesterday; net -2.2L  Medication Assistance / Insurance Benefits Check: Does the patient have prescription insurance?  Yes Type of insurance plan: Medicare  Does the patient qualify for medication assistance through manufacturers or grants?   Yes Eligible grants and/or patient assistance programs: Jardiance Medication assistance applications in progress: Jardiance   Medication assistance applications  approved: None Approved medication assistance renewals will be completed by: [facility]  Outpatient Pharmacy:  Prior to admission outpatient pharmacy: [facility] Is the patient willing to use [facility] T[facility] pharmacy at discharge? Yes Is the patient willing to transition their outpatient pharmacy to utilize a [facility] outpatient pharmacy?   Pending    Assessment: 1. Acute systolic CHF (LVEF 30-35%). NYHA class III-IV symptoms. - Scr 1.33>>1.59 from yesterday (BL variable) with losartan, Jardiance, and metoprolol succinate all starting 5/7. Strict I's and O's. Keep K >4, Mg >2. Recommend standing weights. - Continue furosemide 40 mg PO daily and consider holding tomorrow if his Scr remains the same or continues to decline tomorrow  - Continue metoprolol succinate 25 mg daily - Continue losartan 25 mg daily and transition to [facility] prior to discharge  - Continue empagliflozin 10 mg daily - Recommend holding off on starting spironolactone today given bump in Scr 1.33>>1.59 from yesterday    Plan: 1) Medication changes recommended at this time: - Recommend holding off on starting spironolactone today given bump in Scr 1.33>>1.59 from yesterday  - Recommend transitioning from losartan to [facility] prior to discharge   2) Patient assistance: - Patient has a $545 deductible--pharmacy will pursue patient assistance for Jardiance and possibly Entresto if starting prior to discharge   3)  Education   - Patient has been educated on current HF medications and potential additions to HF medication regimen - Patient verbalizes understanding that over the next few months, these medication doses may change and more medications may be added to optimize HF regimen - Patient has been educated on basic disease state pathophysiology and goals of therapy   Cherylin Mylar, PharmD PGY1 Pharmacy Resident 5/9/20249:41 AM

## 2022-12-31 NOTE — Plan of Care (Signed)
  Problem: Clinical Measurements: Goal: Cardiovascular complication will be avoided Outcome: Progressing   Problem: Activity: Goal: Risk for activity intolerance will decrease Outcome: Progressing   Problem: Activity: Goal: Ability to return to baseline activity level will improve Outcome: Progressing   Problem: Cardiovascular: Goal: Ability to achieve and maintain adequate cardiovascular perfusion will improve Outcome: Progressing

## 2022-12-31 NOTE — Progress Notes (Signed)
TR band has been removed, site is level zero. Tegaderm and gauze applied to R radial site.

## 2022-12-31 NOTE — Consult Note (Signed)
   [facility] CM Inpatient Consult   [facility]) Accountable Care Organization (ACO) [facility] Liaison Note  12/31/2022  Robert Warren [birth year] [id number]  Location: [facility] Liaison screened the patient remotely at [facility].  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 The patient was screened for transfer to [facility] from [facility], alerted by Elliot Cousin, [facility] Liaison of transferred not a readmission hospitalization with noted. Patient with Medium risk score for unplanned readmission risk.   The patient was assessed for potential [facility]) Care Management service needs for post hospital transition for care coordination. Review of patient's electronic medical record reveals patient is for cardiac cath.   Plan: [facility] Liaison will continue to follow progress and disposition to asess for post hospital community care coordination/management needs.  Referral request for community care coordination: anticipate [facility] Transitions of Care Team follow up.   [facility] Care Management/Population Health does not replace or interfere with any arrangements made by the Inpatient Transition of Care team.   For questions contact:   Charlesetta Shanks, RN BSN CCM [facility]  [phone] business mobile phone Toll free office [phone]  *Concierge Line  [phone] Fax number: [phone] [email or web address]

## 2022-12-31 NOTE — Progress Notes (Signed)
Rounding Note    Patient Name: Robert Warren Date of Encounter: 12/31/2022  [facility] Cardiologist: Thurmon Fair, MD   Subjective   Cath with clean coronaries and normal filling pressures  Currently, the patient feels well with no chest pain or SOB.  Inpatient Medications    Scheduled Meds:  empagliflozin  10 mg Oral Daily   furosemide  40 mg Oral Daily   losartan  25 mg Oral Daily   metoprolol succinate  25 mg Oral Daily   rosuvastatin  40 mg Oral Daily   sodium chloride flush  3 mL Intravenous Q12H   sodium chloride flush  3 mL Intravenous Q12H   tamsulosin  0.4 mg Oral QPC supper   Continuous Infusions:  sodium chloride     sodium chloride     PRN Meds: sodium chloride, acetaminophen **OR** acetaminophen, albuterol, hydrALAZINE, labetalol, ondansetron **OR** ondansetron (ZOFRAN) IV, sodium chloride flush   Vital Signs    Vitals:   12/31/22 0823 12/31/22 0839 12/31/22 0859 12/31/22 0900  BP: 131/82 (!) 140/93    Pulse: (!) 59 60 (!) 40 (!) 59  Resp: 17 18    Temp:  97.7 F (36.5 C)    TempSrc:  Oral    SpO2:  98% 95% 98%  Weight:      Height:        Intake/Output Summary (Last 24 hours) at 12/31/2022 0908 Last data filed at 12/30/2022 1300 Gross per 24 hour  Intake 240 ml  Output --  Net 240 ml      12/31/2022    4:47 AM 12/30/2022    4:13 AM 12/29/2022    4:06 AM  Last 3 Weights  Weight (lbs) 337 lb 15.4 oz 342 lb 5.9 oz 339 lb 8.1 oz  Weight (kg) 153.3 kg 155.298 kg 154 kg      Telemetry    Intermittent Afib overnight but back in NSR today - Personally Reviewed  ECG    No new tracing - Personally Reviewed  Physical Exam   GEN: No acute distress.   Neck: No JVD Cardiac: RRR, no murmurs, rubs, or gallops. TR band in place with palpable right radial pulse Respiratory: Clear to auscultation bilaterally. GI: Soft, nontender, non-distended  MS: No edema; No deformity. Neuro:  Nonfocal  Psych: Normal affect   Labs    High  Sensitivity Troponin:  No results for input(s): "TROPONINIHS" in the last 720 hours.   Chemistry Recent Labs  Lab 12/29/22 0422 12/30/22 0422 12/31/22 0052  NA 139 138 139  K 3.6 3.5 4.0  CL 101 103 104  CO2 28 28 28   GLUCOSE 117* 102* 96  BUN 25* 26* 25*  CREATININE 1.40* 1.33* 1.59*  CALCIUM 9.2 8.8* 9.0  MG 2.1 2.2  --   PROT 7.4  --   --   ALBUMIN 3.7  --   --   AST 16  --   --   ALT 16  --   --   ALKPHOS 67  --   --   BILITOT 1.7*  --   --   GFRNONAA 52* 56* 45*  ANIONGAP 10 7 7     Lipids  Recent Labs  Lab 12/31/22 0052  CHOL 132  TRIG 67  HDL 24*  LDLCALC 95  CHOLHDL 5.5    Hematology Recent Labs  Lab 12/27/22 2350 12/28/22 0526 12/29/22 0422  WBC 7.1 6.4 6.7  RBC 5.37 5.65 5.84*  HGB 14.5 15.1 15.6  HCT  45.6 48.2 49.5  MCV 84.9 85.3 84.8  MCH 27.0 26.7 26.7  MCHC 31.8 31.3 31.5  RDW 15.8* 15.6* 15.9*  PLT 184 181 210   Thyroid No results for input(s): "TSH", "FREET4" in the last 168 hours.  BNP Recent Labs  Lab 12/27/22 2350  BNP 1,870.0*    DDimer No results for input(s): "DDIMER" in the last 168 hours.   Radiology    CARDIAC CATHETERIZATION  Result Date: 12/31/2022   Prox RCA lesion is 30% stenosed.   Mid RCA lesion is 30% stenosed.   Ost LM to Dist LM lesion is 30% stenosed.   Mid LAD to Dist LAD lesion is 30% stenosed. 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 No focally obstructive lesions. Normal right and left heart pressures. Recommendations: Medical management of non-ischemic cardiomyopathy. His Eliquis can be restarted tomorrow. I would favor monitoring him today and repeating BMET tomorrow.    Cardiac Studies   Cardiac Studies & Procedures   CARDIAC CATHETERIZATION  CARDIAC CATHETERIZATION 12/31/2022  Narrative   Prox RCA lesion is 30% stenosed.   Mid RCA lesion is 30% stenosed.   Ost LM to Dist LM lesion is 30% stenosed.   Mid LAD to Dist LAD lesion is 30% stenosed.  All three coronary arteries have aneurysmal  segments. No focally obstructive lesions. Normal right and left heart pressures.  Recommendations: Medical management of non-ischemic cardiomyopathy. His Eliquis can be restarted tomorrow. I would favor monitoring him today and repeating BMET tomorrow.  Findings Coronary Findings Diagnostic  Dominance: Right  Left Main Ost LM to Dist LM lesion is 30% stenosed.  Left Anterior Descending Mid LAD to Dist LAD lesion is 30% stenosed.  Right Coronary Artery Vessel is large. Prox RCA lesion is 30% stenosed. Mid RCA lesion is 30% stenosed.  Intervention  No interventions have been documented.   STRESS TESTS  MYOCARDIAL PERFUSION IMAGING 06/18/2022  Narrative   LV perfusion is abnormal. There is no evidence of ischemia. There is evidence of infarction. Defect 1: There is a small defect with moderate reduction in uptake present in the mid to basal inferior and inferolateral location(s) that is fixed. There is abnormal wall motion in the defect area. Consistent with infarction.   Left ventricular function is abnormal. Global function is mildly reduced. There was a single regional abnormality. Nuclear stress EF: 40 %. The left ventricular ejection fraction is moderately decreased (30-44%). End diastolic cavity size is moderately enlarged.   Findings are consistent with prior myocardial infarction. The study is intermediate risk.   ECHOCARDIOGRAM  ECHOCARDIOGRAM COMPLETE 12/28/2022  Narrative ECHOCARDIOGRAM REPORT    Patient Name:   Robert Warren Date of Exam: 12/28/2022 Medical Rec #:  [id number]       Height:       76.0 in Accession #:    [id number]      Weight:       346.6 lb Date of Birth:  1945/08/30       BSA:          2.798 m Patient Age:    77 years        BP:           138/77 mmHg Patient Gender: M               HR:           52 bpm. [facility]  Procedure: 2D Echo, Cardiac Doppler and Color Doppler  Indications:    CHF-Acute Systolic  I50.21  History:         Patient has prior history of Echocardiogram examinations, most recent 06/09/2022. CHF, Arrythmias:Atrial Fibrillation; Risk Factors:Hypertension, Dyslipidemia and Former Smoker.  Sonographer:    Celesta Gentile RCS Referring Phys: [id number] OLADAPO ADEFESO  IMPRESSIONS   1. Left ventricular ejection fraction, by estimation, is 30 to 35%. The left ventricle has moderately decreased function. The left ventricle demonstrates global hypokinesis. The left ventricular internal cavity size was severely dilated. There is mild concentric left ventricular hypertrophy. Left ventricular diastolic parameters are consistent with Grade I diastolic dysfunction (impaired relaxation). Elevated left atrial pressure. 2. Right ventricular systolic function is low normal. The right ventricular size is normal. 3. Left atrial size was moderately dilated. 4. Right atrial size was mildly dilated. 5. The mitral valve is normal in structure. Trivial mitral valve regurgitation. 6. The aortic valve is tricuspid. Aortic valve regurgitation is not visualized.  Comparison(s): The left ventricular function is worsened.  FINDINGS Left Ventricle: Left ventricular ejection fraction, by estimation, is 30 to 35%. The left ventricle has moderately decreased function. The left ventricle demonstrates global hypokinesis. The left ventricular internal cavity size was severely dilated. There is mild concentric left ventricular hypertrophy. Left ventricular diastolic parameters are consistent with Grade I diastolic dysfunction (impaired relaxation). Elevated left atrial pressure.  Right Ventricle: The right ventricular size is normal. Right vetricular wall thickness was not assessed. Right ventricular systolic function is low normal.  Left Atrium: Left atrial size was moderately dilated.  Right Atrium: Right atrial size was mildly dilated.  Pericardium: There is no evidence of pericardial effusion.  Mitral Valve: The mitral valve  is normal in structure. Trivial mitral valve regurgitation.  Tricuspid Valve: The tricuspid valve is normal in structure. Tricuspid valve regurgitation is trivial.  Aortic Valve: The aortic valve is tricuspid. Aortic valve regurgitation is not visualized.  Pulmonic Valve: The pulmonic valve was normal in structure. Pulmonic valve regurgitation is not visualized.  Aorta: The aortic root is normal in size and structure.  IAS/Shunts: No atrial level shunt detected by color flow Doppler.   LEFT VENTRICLE PLAX 2D LVIDd:         6.40 cm      Diastology LVIDs:         4.90 cm      LV e' medial:    3.44 cm/s LV PW:         1.40 cm      LV E/e' medial:  15.4 LV IVS:        1.40 cm      LV e' lateral:   4.40 cm/s LVOT diam:     2.40 cm      LV E/e' lateral: 12.0 LV SV:         72 LV SV Index:   26 LVOT Area:     4.52 cm  LV Volumes (MOD) LV vol d, MOD A2C: 162.0 ml LV vol d, MOD A4C: 249.0 ml LV vol s, MOD A2C: 104.0 ml LV vol s, MOD A4C: 172.5 ml LV SV MOD A2C:     58.0 ml LV SV MOD A4C:     249.0 ml LV SV MOD BP:      68.1 ml  RIGHT VENTRICLE RV S prime:     15.40 cm/s TAPSE (M-mode): 2.9 cm  LEFT ATRIUM              Index        RIGHT ATRIUM  Index LA diam:        5.60 cm  2.00 cm/m   RA Area:     23.80 cm LA Vol (A2C):   101.0 ml 36.09 ml/m  RA Volume:   82.60 ml  29.52 ml/m LA Vol (A4C):   129.0 ml 46.10 ml/m LA Biplane Vol: 120.0 ml 42.88 ml/m AORTIC VALVE LVOT Vmax:   81.50 cm/s LVOT Vmean:  61.200 cm/s LVOT VTI:    0.160 m  AORTA Ao Root diam: 4.00 cm  MITRAL VALVE MV Area (PHT): 2.34 cm    SHUNTS MV Decel Time: 324 msec    Systemic VTI:  0.16 m MV E velocity: 52.90 cm/s  Systemic Diam: 2.40 cm MV A velocity: 70.30 cm/s MV E/A ratio:  0.75  Dietrich Pates MD Electronically signed by Dietrich Pates MD Signature Date/Time: 12/28/2022/3:36:23 PM    Final              Patient Profile     77 y.o. male with PMH of paroxysmal atrial fibrillation,  PVC's, abnormal stress test (NST in 05/2022 showing evidence of prior inferior infarct but no ischemia), HTN, HLD and OSA (on CPAP) who presented with worsening SOB found to have acute CHF exacerbation with newly reduced EF 30-35% for which Cardiology was consulted.  Assessment & Plan    #Acute Systolic HF: -Patient presented with worsening dyspnea found to have BNP 1870 with pulmonary edema on CXR consistent with acute HF exacerbation -TTE this admission with newly reduced EF 30-35% with global hypokinesis from 50-55% in 05/2022 -LHC/RHC today with mild CAD but no obstructive lesions (has aneurysmal segments of all 3 coronaires) and normal right and left heart filling pressures -Findings consistent with newly diagnosed NICM -Unclear cause (? Higher burden of Afib); will need further work-up as outpatient (consider cardiac monitor +/- CMR) -Will optimize GDMT as able as inpatient -Continue metop 25mg  XL daily -Continue jardiance 10mg  daily -Continue losartan 25mg  daily with goal to transition to entresto as outpatient -Start spiro 12.5mg  daily today -Likely discharge home tomorrow   #Paroxysmal Afib: -Runs of Afib overnight but back in NSR today -Plan to resume home apixaban tomorrow following cath -Continue metop 25mg  XL daily  #HTN: -Continue metop and losartan as above  #HLD: -Continue crestor 40mg  daily as above  For questions or updates, please contact [facility] Please consult [email or web address] for contact info under        Signed, Meriam Sprague, MD  12/31/2022, 9:08 AM

## 2022-12-31 NOTE — Interval H&P Note (Signed)
History and Physical Interval Note:  12/31/2022 7:29 AM  Robert Warren  has presented today for surgery, with the diagnosis of new cardiomyopathy.  The various methods of treatment have been discussed with the patient and family. After consideration of risks, benefits and other options for treatment, the patient has consented to  Procedure(s): RIGHT/LEFT HEART CATH AND CORONARY ANGIOGRAPHY (N/A) as a surgical intervention.  The patient's history has been reviewed, patient examined, no change in status, stable for surgery.  I have reviewed the patient's chart and labs.  Questions were answered to the patient's satisfaction.    Cath Lab Visit (complete for each Cath Lab visit)  Clinical Evaluation Leading to the Procedure:   ACS: No.  Non-ACS:    Anginal Classification: CCS I  Anti-ischemic medical therapy: Minimal Therapy (1 class of medications)  Non-Invasive Test Results: No non-invasive testing performed  Prior CABG: No previous CABG        Verne Carrow

## 2022-12-31 NOTE — Progress Notes (Signed)
Heart Failure Nurse Navigator Progress Note  PCP: Ignatius Specking, MD PCP-Cardiologist: [facility] Admission Diagnosis: Acute systolic congestive heart failure. Sinus bradycardia Admitted from: Home  Presentation:   Robert Warren presented with shortness of breath, reported that patient was diagnosed with bronchitis a few months ago and has been short of breath since. BMI 42.60, BNP 1870, Cath 5/9 with non-ischemic cardiomyopathy, medical manage.  CXR showing mild vascular congestion.   Patient was educated on the sign and symptom of heart failure, daily weights, when to call his doctor or go to the ED. Diet/ fluid restrictions, taking all medications as prescribed and attending all medical appointments. Patient verbalized his understanding, a HF TOC appointment was scheduled for 01/20/2023 ! 10 am.   ECHO/ LVEF: 30-35% HFrEF new  Clinical Course:  Past Medical History:  Diagnosis Date   Anxiety    Atrial fibrillation (HCC)    Hypertension    Shortness of breath    Sleep apnea      Social History   Socioeconomic History   Marital status: Married    Spouse name: Not on file   Number of children: Not on file   Years of education: Not on file   Highest education level: Not on file  Occupational History   Not on file  Tobacco Use   Smoking status: Former    Packs/day: 1.00    Years: 30.00    Additional pack years: 0.00    Total pack years: 30.00    Types: Cigarettes    Quit date: 08/23/1996    Years since quitting: 26.3   Smokeless tobacco: Never  Vaping Use   Vaping Use: Never used  Substance and Sexual Activity   Alcohol use: Yes    Comment: occasional   Drug use: No   Sexual activity: Yes  Other Topics Concern   Not on file  Social History Narrative   Not on file   Social Determinants of Health   Financial Resource Strain: Not on file  Food Insecurity: No Food Insecurity (12/28/2022)   Hunger Vital Sign    Worried About Running Out of Food in the Last Year:  Never true    Ran Out of Food in the Last Year: Never true  Transportation Needs: No Transportation Needs (12/28/2022)   PRAPARE - Administrator, Civil Service (Medical): No    Lack of Transportation (Non-Medical): No  Physical Activity: Not on file  Stress: Not on file  Social Connections: Not on file   Education Assessment and Provision:  Detailed education and instructions provided on heart failure disease management including the following:  Signs and symptoms of Heart Failure When to call the physician Importance of daily weights Low sodium diet Fluid restriction Medication management Anticipated future follow-up appointments  Patient education given on each of the above topics.  Patient acknowledges understanding via teach back method and acceptance of all instructions.  Education Materials:  "Living Better With Heart Failure" Booklet, HF zone tool, & Daily Weight Tracker Tool.  Patient has scale at home: yes Patient has pill box at home: yes     High Risk Criteria for Readmission and/or Poor Patient Outcomes: Heart failure hospital admissions (last 6 months): 1  No Show rate: 0 Difficult social situation: No, lives with wife Demonstrates medication adherence: yes Primary Language: English Literacy level: Reading, writing, and comprehension  Barriers of Care:   Diet/ fluid (some salt) Daily weights  Considerations/Referrals:   Referral made to Heart Failure Pharmacist Stewardship:  Yes Referral made to Heart Failure CSW/NCM TOC: No Referral made to Heart & Vascular TOC clinic: Yes, 01/20/2023 @ 10 am  Items for Follow-up on DC/TOC: Diet/ fluids ( some salt) Daily weights Continued HF education   Rhae Hammock, BSN, RN Heart Failure Print production planner Chat Only

## 2023-01-01 ENCOUNTER — Telehealth: Payer: Self-pay | Admitting: *Deleted

## 2023-01-01 ENCOUNTER — Other Ambulatory Visit (HOSPITAL_COMMUNITY): Payer: Self-pay

## 2023-01-01 ENCOUNTER — Encounter (HOSPITAL_COMMUNITY): Payer: Self-pay | Admitting: Cardiovascular Disease

## 2023-01-01 DIAGNOSIS — I5021 Acute systolic (congestive) heart failure: Secondary | ICD-10-CM | POA: Diagnosis not present

## 2023-01-01 DIAGNOSIS — I480 Paroxysmal atrial fibrillation: Secondary | ICD-10-CM | POA: Diagnosis not present

## 2023-01-01 LAB — CBC
HCT: 46 % (ref 39.0–52.0)
Hemoglobin: 14.5 g/dL (ref 13.0–17.0)
MCH: 26.8 pg (ref 26.0–34.0)
MCHC: 31.5 g/dL (ref 30.0–36.0)
MCV: 84.9 fL (ref 80.0–100.0)
Platelets: 207 10*3/uL (ref 150–400)
RBC: 5.42 MIL/uL (ref 4.22–5.81)
RDW: 15.6 % — ABNORMAL HIGH (ref 11.5–15.5)
WBC: 4.9 10*3/uL (ref 4.0–10.5)
nRBC: 0 % (ref 0.0–0.2)

## 2023-01-01 LAB — BASIC METABOLIC PANEL
Anion gap: 9 (ref 5–15)
BUN: 21 mg/dL (ref 8–23)
CO2: 26 mmol/L (ref 22–32)
Calcium: 9 mg/dL (ref 8.9–10.3)
Chloride: 107 mmol/L (ref 98–111)
Creatinine, Ser: 1.29 mg/dL — ABNORMAL HIGH (ref 0.61–1.24)
GFR, Estimated: 58 mL/min — ABNORMAL LOW (ref >60)
Glucose, Bld: 70 mg/dL (ref 70–99)
Potassium: 4.2 mmol/L (ref 3.5–5.1)
Sodium: 142 mmol/L (ref 135–145)

## 2023-01-01 MED ORDER — FUROSEMIDE 40 MG PO TABS: 40.0000 mg | ORAL_TABLET | Freq: Every day | ORAL | Status: DC | PRN

## 2023-01-01 MED ORDER — APIXABAN 5 MG PO TABS
5.0000 mg | ORAL_TABLET | Freq: Two times a day (BID) | ORAL | Status: DC
  Administered 2023-01-01: 5 mg via ORAL
  Filled 2023-01-01: qty 1

## 2023-01-01 MED ORDER — POTASSIUM CHLORIDE CRYS ER 20 MEQ PO TBCR
40.0000 meq | EXTENDED_RELEASE_TABLET | Freq: Once | ORAL | Status: AC
  Administered 2023-01-01: 40 meq via ORAL
  Filled 2023-01-01: qty 2

## 2023-01-01 MED ORDER — METOPROLOL SUCCINATE ER 25 MG PO TB24
25.0000 mg | ORAL_TABLET | Freq: Every day | ORAL | 3 refills | Status: DC
  Filled 2023-01-01: qty 30, 30d supply, fill #0

## 2023-01-01 MED ORDER — SPIRONOLACTONE 25 MG PO TABS
12.5000 mg | ORAL_TABLET | Freq: Every day | ORAL | 3 refills | Status: DC
  Filled 2023-01-01: qty 15, 30d supply, fill #0

## 2023-01-01 MED ORDER — LOSARTAN POTASSIUM 25 MG PO TABS
25.0000 mg | ORAL_TABLET | Freq: Every day | ORAL | 3 refills | Status: DC
  Filled 2023-01-01: qty 30, 30d supply, fill #0

## 2023-01-01 MED ORDER — EMPAGLIFLOZIN 10 MG PO TABS
10.0000 mg | ORAL_TABLET | Freq: Every day | ORAL | 3 refills | Status: DC
  Filled 2023-01-01: qty 30, 30d supply, fill #0

## 2023-01-01 MED ORDER — FUROSEMIDE 40 MG PO TABS
40.0000 mg | ORAL_TABLET | Freq: Every day | ORAL | 6 refills | Status: DC | PRN
  Filled 2023-01-01: qty 30, 30d supply, fill #0

## 2023-01-01 NOTE — Progress Notes (Signed)
Pt has orders to be discharged. Discharge instructions given and pt has no additional questions at this time. Medication regimen reviewed and pt educated. Pt verbalized understanding of checking BP 2 hrs after AM meds and has no additional questions. Telemetry box removed. IV removed and site in good condition. Pt stable and waiting for transportation.

## 2023-01-01 NOTE — Care Management Important Message (Signed)
Important Message  Patient Details  Name: Robert Warren MRN: [id number] Date of Birth: [birth year]   Medicare Important Message Given:  Yes     Renie Ora 01/01/2023, 10:18 AM

## 2023-01-01 NOTE — Progress Notes (Signed)
   Heart Failure Stewardship Pharmacist Progress Note   PCP: Ignatius Specking, MD PCP-Cardiologist: Thurmon Fair, MD   HPI:  77 YO male with a PMH of atrial fibrillation with RVR, OSA, morbid obesity, hypertension, hyperlipidemia   Patient presented to the ED on 5/5 with complaints of shortness of breath that is worse on exertion and when lying flat. Patient recently saw his PCP for these same symptoms and was prescribed antibiotics/steroids for presumed bronchitis with no significant improvement. Echo from 06/09/22 showed EF 50-55%, no wall motion abnormalities, G1DD, and normal RV function. Echo from this admission showing EF 30-35% with global hypokinesis, mild LV hypertrophy, G1DD, low-normal RV function and trivial MVR. ReDS vest reading 32%. R/LHC from 5/9 showing mild stenoses (30%) of RCA, LM, and LAD; CO/CI 5.45/1.96, RAP 5 mmHg, PAP 19 mmHg. Patient on metoprolol succinate outpatient for frequent PVCs and lisinopril--he reports compliance with both. Per RN, patient's HR will sometimes drop into 30s-40s while asleep due to OSA.   Current HF Medications: Diuretic: furosemide 40 mg PO daily Beta Blocker: metoprolol succinate 25 mg daily ACE/ARB/ARNI: losartan 25 mg daily SGLT2i: empagliflozin 10 mg daily  Prior to admission HF Medications: Beta blocker: metoprolol succinate 50 mg daily ACE/ARB/ARNI: lisinopril 40 mg daily  Pertinent Lab Values: Serum creatinine 1.29, BUN 21, Potassium 4.2, Sodium 142, BNP 1870, Magnesium 2.2  Vital Signs: Weight: 342 lbs (standing weight); admission weight: 350 lbs Blood pressure: 100s-130s/80s  Heart rate: 50-60s  I/O: - yesterday; net -3L  Medication Assistance / Insurance Benefits Check: Does the patient have prescription insurance?  Yes Type of insurance plan: Medicare  Does the patient qualify for medication assistance through manufacturers or grants?   Yes Eligible grants and/or patient assistance programs: Jardiance Medication  assistance applications in progress: Jardiance   Medication assistance applications approved: None Approved medication assistance renewals will be completed by: [facility]  Outpatient Pharmacy:  Prior to admission outpatient pharmacy: [facility] Is the patient willing to use [facility] TOC pharmacy at discharge? Yes Is the patient willing to transition their outpatient pharmacy to utilize a [facility] outpatient pharmacy?   Pending    Assessment: 1. Acute systolic CHF (LVEF 30-35%). NYHA class II-III symptoms. - Scr 1.59>>1.29 from yesterday (BL variable) with losartan, Jardiance, and metoprolol succinate all starting 5/7. Strict I's and O's. Keep K >4, Mg >2. Recommend standing weights. - Continue furosemide 40 mg PO daily and consider changing to PRN at discharge  - Continue metoprolol succinate 25 mg daily - Continue losartan 25 mg daily and consider transitioning to [facility] outpatient - Continue empagliflozin 10 mg daily - Recommend starting spironolactone 12.5 mg daily upon discharge   Plan: 1) Medication changes recommended at this time: - Recommend starting spironolactone 12.5 mg daily upon discharge  - Recommend changing furosemide to as needed at discharge  - Recommend transitioning from losartan to [facility] outpatient   2) Patient assistance: - Patient has a $545 deductible--patient assistance for Jardiance is in process - Patient assistance for Sherryll Burger will likely be needed if started outpatient   3)  Education   - Patient has been educated on current HF medications and potential additions to HF medication regimen - Patient verbalizes understanding that over the next few months, these medication doses may change and more medications may be added to optimize HF regimen - Patient has been educated on basic disease state pathophysiology and goals of therapy   Cherylin Mylar, PharmD PGY1 Pharmacy Resident 5/10/20249:32 AM

## 2023-01-01 NOTE — Telephone Encounter (Signed)
Toc call Discharged 01/01/23  Call Monday 01/04/23

## 2023-01-01 NOTE — Discharge Summary (Addendum)
Discharge Summary    Patient ID: Robert Warren MRN: [id number]; DOB: [birth year]  Admit date: 12/27/2022 Discharge date: 01/01/2023  PCP:  Ignatius Specking, MD   [facility] Providers Cardiologist:  Thurmon Fair, MD   Discharge Diagnoses    Principal Problem:   CHF (congestive heart failure) (HCC) Active Problems:   BPH (benign prostatic hyperplasia)   Morbid obesity (HCC)   Paroxysmal atrial fibrillation (HCC)   OSA (obstructive sleep apnea)   Mixed hyperlipidemia   Essential hypertension   Acute heart failure with preserved ejection fraction (HFpEF) (HCC)    Diagnostic Studies/Procedures    Right and left heart cath 12/31/22:   Prox RCA lesion is 30% stenosed.   Mid RCA lesion is 30% stenosed.   Ost LM to Dist LM lesion is 30% stenosed.   Mid LAD to Dist LAD lesion is 30% stenosed.   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  No focally obstructive lesions.  Normal right and left heart pressures.    Recommendations: Medical management of non-ischemic cardiomyopathy. His Eliquis can be restarted tomorrow. I would favor monitoring him today and repeating BMET tomorrow.  _____________   Echo 12/28/22: 1. Left ventricular ejection fraction, by estimation, is 30 to 35%. The  left ventricle has moderately decreased function. The left ventricle  demonstrates global hypokinesis. The left ventricular internal cavity size  was severely dilated. There is mild  concentric left ventricular hypertrophy. Left ventricular diastolic  parameters are consistent with Grade I diastolic dysfunction (impaired  relaxation). Elevated left atrial pressure.   2. Right ventricular systolic function is low normal. The right  ventricular size is normal.   3. Left atrial size was moderately dilated.   4. Right atrial size was mildly dilated.   5. The mitral valve is normal in structure. Trivial mitral valve  regurgitation.   6. The aortic valve is tricuspid. Aortic valve  regurgitation is not  visualized.   History of Present Illness     Robert Warren is a 77 y.o. male with a hx of paroxysmal atrial fibrillation, PVC's, abnormal stress test (NST in 05/2022 showing evidence of prior inferior infarct but no ischemia), HTN, HLD and OSA (on CPAP) who is being seen 12/29/2022 for the evaluation of CHF.  Robert Warren was last examined by Dr. Royann Shivers in 09/2022 and reported occasional episodes of dyspnea at rest but denied any recent exertional symptoms. He was having frequent PVC's and it was felt that he may respond better to beta-blocker therapy as compared to Verapamil, therefore Verapamil was discontinued and he was switched to Toprol-XL 50 mg daily. Was recommended to overall be on a lower dose given a history of junctional bradycardia on higher doses of AV nodal blocking agents. Notes also mentioned that he was compliant with anticoagulation but I am unable to see where he was on anticoagulation at that time (previously on Eliquis 5mg  BID by review of prior medication lists).    He presented to [facility] ED on 12/27/2022 for evaluation of worsening dyspnea at rest and with activity. In talking with the patient today, he reports worsening dyspnea at rest and with activity for the past few months. Notices mostly when being active around his home and helping take care of his grandchildren. He also reports orthopnea at night but no specific PND, abdominal distention or lower extremity edema. Reports occasional episodes of chest tightness when he cannot catch his breath and this occurs with activity and resolves with rest. Still has occasional  palpitations and he does use a Kardia monitor at home to see if he is in sinus rhythm or atrial fibrillation.   Initial labs showed WBC 7.1, Hgb 14.5, platelets 184, Na+ 138, K+ 4.1 and creatinine 1.16 (close to baseline). BNP was elevated to 1870. Negative for COVID and Influenza. CXR showing mild vascular congestion. EKG showing NSR, HR  60 with 1st degree AV block and slight TWI along lateral leads. Echo this admission shows a reduced EF of 30-35% with global HK, mild LVH, Grade 1 DD, low-normal RV function and trivial MR (EF previously 50-55% in 05/2022).    He was admitted for an acute CHF exacerbation has been started on IV Lasix 40 mg twice daily. Received a total of 3 doses yesterday with a recorded net output of -2.1 L thus far. Weight at 339 lbs today (listed as 350 lbs on admission but at 360 lbs during his office visit on 09/29/2022). Labs today show his creatinine has increased to 1.40 but Lasix was reduced to once daily by the Hospitalist team already. BP has also been soft at 84/58 - 135/86 in the past 24 hours, at 107/51 on most recent check. Toprol-XL was held yesterday but did receive Lisinopril 40mg .   Hospital Course     Consultants: none  Acute systolic heart failure NICM Patient presented with worsening dyspnea with a BNP 1870, pulmonary edema on CXR concerning for acute CHF exacerbation. Echocardiogram this admission showed a newly recognized LVEF of 30-35%. Hx of NST that showed prior infarct, no ischemia. He underwent right and left heart cath that showed aneurysmal segments in all three coronary arteries, no focally obstructive lesions and normal right and left heart pressures.  GDMT limited by borderline BP. Will discharge on 25 mg toprol, 10 mg jardiance, losartan 25 mg, spironolactone 12.5 mg daily. Laisx 40 mg PRN for weight gain or LE edema. Plan to transition losartan to [facility] outpatient.    PAF Telemetry with NSR today. Has had bouts of Afib throughout this hospitalization. Will resume eliquis, continue BB. Will not tolerate higher doses of BB as he tends to have bradycardia. Telemetry with HR in the 30s, known OSA. Of note, was on 50 mg toprol at admission.   Need for chronic anticoagulation Eliquis held for R/L [facility], now resumed.   Hypertension Managed in the context of PAF and CHF GDMT has  been limited by soft BP. Medications as above.   Hyperlipidemia with LDL goal < 70 12/31/2022: Cholesterol 132; HDL 24; LDL Cholesterol 95; Triglycerides 67; VLDL 13 Continue crestor 40 mg.    Pt seen and examined by Dr. Shari Prows and felt stable for discharge.  Follow up has been arranged.       Did the patient have an acute coronary syndrome (MI, NSTEMI, STEMI, etc) this admission?:  No                               Did the patient have a percutaneous coronary intervention (stent / angioplasty)?:  No.        The patient will be scheduled for a TOC follow up appointment in 7-10 days.  A message has been sent to the [facility] and Scheduling Pool at the office where the patient should be seen for follow up.  _____________  Discharge Vitals Blood pressure 117/78, pulse 60, temperature 97.6 F (36.4 C), temperature source Oral, resp. rate 18, height 6\' 4"  (1.93 m), weight Marland Kitchen)  155.4 kg, SpO2 98 %.  Filed Weights   12/30/22 0413 12/31/22 0447 01/01/23 0406  Weight: (!) 155.3 kg (!) 153.3 kg (!) 155.4 kg    Labs & Radiologic Studies    CBC Recent Labs    12/31/22 0803 12/31/22 0809  HGB 15.3  15.0 15.0  HCT 45.0  44.0 44.0   Basic Metabolic Panel Recent Labs    16/10/96 0422 12/31/22 0052 12/31/22 0803 12/31/22 0809  NA 138 139 142  145 142  K 3.5 4.0 3.5  3.2* 3.4*  CL 103 104  --   --   CO2 28 28  --   --   GLUCOSE 102* 96  --   --   BUN 26* 25*  --   --   CREATININE 1.33* 1.59*  --   --   CALCIUM 8.8* 9.0  --   --   MG 2.2  --   --   --    Liver Function Tests No results for input(s): "AST", "ALT", "ALKPHOS", "BILITOT", "PROT", "ALBUMIN" in the last 72 hours. No results for input(s): "LIPASE", "AMYLASE" in the last 72 hours. High Sensitivity Troponin:   No results for input(s): "TROPONINIHS" in the last 720 hours.  BNP Invalid input(s): "POCBNP" D-Dimer No results for input(s): "DDIMER" in the last 72 hours. Hemoglobin A1C No results for input(s):  "HGBA1C" in the last 72 hours. Fasting Lipid Panel Recent Labs    12/31/22 0052  CHOL 132  HDL 24*  LDLCALC 95  TRIG 67  CHOLHDL 5.5   Thyroid Function Tests No results for input(s): "TSH", "T4TOTAL", "T3FREE", "THYROIDAB" in the last 72 hours.  Invalid input(s): "FREET3" _____________  CARDIAC CATHETERIZATION  Result Date: 12/31/2022   Prox RCA lesion is 30% stenosed.   Mid RCA lesion is 30% stenosed.   Ost LM to Dist LM lesion is 30% stenosed.   Mid LAD to Dist LAD lesion is 30% stenosed. 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 No focally obstructive lesions. Normal right and left heart pressures. Recommendations: Medical management of non-ischemic cardiomyopathy. His Eliquis can be restarted tomorrow. I would favor monitoring him today and repeating BMET tomorrow.   ECHOCARDIOGRAM COMPLETE  Result Date: 12/28/2022    ECHOCARDIOGRAM REPORT   Patient Name:   Robert Warren Date of Exam: 12/28/2022 Medical Rec #:  [id number]       Height:       76.0 in Accession #:    [id number]      Weight:       346.6 lb Date of Birth:  1946-07-12       BSA:          2.798 m Patient Age:    77 years        BP:           138/77 mmHg Patient Gender: M               HR:           52 bpm. [facility] Procedure: 2D Echo, Cardiac Doppler and Color Doppler Indications:    CHF-Acute Systolic I50.21  History:        Patient has prior history of Echocardiogram examinations, most                 recent 06/09/2022. CHF, Arrythmias:Atrial Fibrillation; Risk                 Factors:Hypertension, Dyslipidemia and Former Smoker.  Sonographer:  Celesta Gentile RCS Referring Phys: [id number] OLADAPO ADEFESO IMPRESSIONS  1. Left ventricular ejection fraction, by estimation, is 30 to 35%. The left ventricle has moderately decreased function. The left ventricle demonstrates global hypokinesis. The left ventricular internal cavity size was severely dilated. There is mild concentric left ventricular  hypertrophy. Left ventricular diastolic parameters are consistent with Grade I diastolic dysfunction (impaired relaxation). Elevated left atrial pressure.  2. Right ventricular systolic function is low normal. The right ventricular size is normal.  3. Left atrial size was moderately dilated.  4. Right atrial size was mildly dilated.  5. The mitral valve is normal in structure. Trivial mitral valve regurgitation.  6. The aortic valve is tricuspid. Aortic valve regurgitation is not visualized. Comparison(s): The left ventricular function is worsened. FINDINGS  Left Ventricle: Left ventricular ejection fraction, by estimation, is 30 to 35%. The left ventricle has moderately decreased function. The left ventricle demonstrates global hypokinesis. The left ventricular internal cavity size was severely dilated. There is mild concentric left ventricular hypertrophy. Left ventricular diastolic parameters are consistent with Grade I diastolic dysfunction (impaired relaxation). Elevated left atrial pressure. Right Ventricle: The right ventricular size is normal. Right vetricular wall thickness was not assessed. Right ventricular systolic function is low normal. Left Atrium: Left atrial size was moderately dilated. Right Atrium: Right atrial size was mildly dilated. Pericardium: There is no evidence of pericardial effusion. Mitral Valve: The mitral valve is normal in structure. Trivial mitral valve regurgitation. Tricuspid Valve: The tricuspid valve is normal in structure. Tricuspid valve regurgitation is trivial. Aortic Valve: The aortic valve is tricuspid. Aortic valve regurgitation is not visualized. Pulmonic Valve: The pulmonic valve was normal in structure. Pulmonic valve regurgitation is not visualized. Aorta: The aortic root is normal in size and structure. IAS/Shunts: No atrial level shunt detected by color flow Doppler.  LEFT VENTRICLE PLAX 2D LVIDd:         6.40 cm      Diastology LVIDs:         4.90 cm      LV e'  medial:    3.44 cm/s LV PW:         1.40 cm      LV E/e' medial:  15.4 LV IVS:        1.40 cm      LV e' lateral:   4.40 cm/s LVOT diam:     2.40 cm      LV E/e' lateral: 12.0 LV SV:         72 LV SV Index:   26 LVOT Area:     4.52 cm  LV Volumes (MOD) LV vol d, MOD A2C: 162.0 ml LV vol d, MOD A4C: 249.0 ml LV vol s, MOD A2C: 104.0 ml LV vol s, MOD A4C: 172.5 ml LV SV MOD A2C:     58.0 ml LV SV MOD A4C:     249.0 ml LV SV MOD BP:      68.1 ml RIGHT VENTRICLE RV S prime:     15.40 cm/s TAPSE (M-mode): 2.9 cm LEFT ATRIUM              Index        RIGHT ATRIUM           Index LA diam:        5.60 cm  2.00 cm/m   RA Area:     23.80 cm LA Vol (A2C):   101.0 ml 36.09 ml/m  RA Volume:   82.60  ml  29.52 ml/m LA Vol (A4C):   129.0 ml 46.10 ml/m LA Biplane Vol: 120.0 ml 42.88 ml/m  AORTIC VALVE LVOT Vmax:   81.50 cm/s LVOT Vmean:  61.200 cm/s LVOT VTI:    0.160 m  AORTA Ao Root diam: 4.00 cm MITRAL VALVE MV Area (PHT): 2.34 cm    SHUNTS MV Decel Time: 324 msec    Systemic VTI:  0.16 m MV E velocity: 52.90 cm/s  Systemic Diam: 2.40 cm MV A velocity: 70.30 cm/s MV E/A ratio:  0.75 Dietrich Pates MD Electronically signed by Dietrich Pates MD Signature Date/Time: 12/28/2022/3:36:23 PM    Final    DG Chest 2 View  Result Date: 12/28/2022 CLINICAL DATA:  Shortness of breath, cough, chest pain EXAM: CHEST - 2 VIEW COMPARISON:  12/11/2022 FINDINGS: Heart is upper limits normal in size. Mild vascular congestion. No confluent opacities, effusions or edema. No acute bony abnormality. IMPRESSION: Mild vascular congestion. Electronically Signed   By: Charlett Nose M.D.   On: 12/28/2022 00:17   Disposition   Pt is being discharged home today in good condition.  Follow-up Plans & Appointments     Follow-up Information     [facility]. Go in 19 day(s).   Specialty: Cardiology Why: Hospital follow up 01/20/2023 @ 10 am PLEASE bring a current medication list to appointment FREE valet  parking, Entrance C, off [employer] information: [address] [id number] mc [place] [address] [phone]        Ronney Asters, NP Follow up on 01/05/2023.   Specialty: Cardiology Why: 2:45 pm, will also need labwork, do not need to be fasting Contact information: [address] [place] [address] [phone]                Discharge Instructions     Diet - low sodium heart healthy   Complete by: As directed    Discharge instructions   Complete by: As directed    No driving for 2 days. No lifting over 5 lbs for 1 week. No sexual activity for 1 week. Keep procedure site clean & dry. If you notice increased pain, swelling, bleeding or pus, call/return!  You may shower, but no soaking baths/hot tubs/pools for 1 week.   Increase activity slowly   Complete by: As directed         Discharge Medications   Allergies as of 01/01/2023   No Known Allergies      Medication List     STOP taking these medications    lisinopril 40 MG tablet Commonly known as: ZESTRIL   tamsulosin 0.4 MG Caps capsule Commonly known as: Flomax       TAKE these medications    ARIPiprazole 15 MG tablet Commonly known as: ABILIFY Take 15 mg by mouth daily.   buPROPion 200 MG 12 hr tablet Commonly known as: WELLBUTRIN SR Take 200 mg by mouth daily as needed (Depression).   diclofenac Sodium 1 % Gel Commonly known as: VOLTAREN Apply 2 g topically as needed (pain).   Eliquis 5 MG Tabs tablet Generic drug: apixaban Take 5 mg by mouth 2 (two) times daily.   empagliflozin 10 MG Tabs tablet Commonly known as: JARDIANCE Take 1 tablet (10 mg total) by mouth daily. Start taking on: Jan 02, 2023   furosemide 40 MG tablet Commonly known as: LASIX Take 1 tablet (40 mg total) by mouth daily as needed for fluid or edema.  Take for 3 lb weight gain overnight or a 5 lb weight gain in 1 week.   latanoprost 0.005 % ophthalmic  solution Commonly known as: XALATAN 1 drop at bedtime.   losartan 25 MG tablet Commonly known as: COZAAR Take 1 tablet (25 mg total) by mouth daily. Start taking on: Jan 02, 2023   metoprolol succinate 25 MG 24 hr tablet Commonly known as: TOPROL-XL Take 1 tablet (25 mg total) by mouth daily. Start taking on: Jan 02, 2023 What changed:  medication strength how much to take additional instructions   rosuvastatin 40 MG tablet Commonly known as: CRESTOR Take 40 mg by mouth daily.   spironolactone 25 MG tablet Commonly known as: Aldactone Take 0.5 tablets (12.5 mg total) by mouth daily.           Outstanding Labs/Studies   Needs BMP  Has TOC AHF clinic follow up as well  Duration of Discharge Encounter   Greater than 30 minutes including physician time.  Signed, Roe Rutherford Duke, PA 01/01/2023, 11:41 AM   Patient seen and examined and agree with Micah Flesher, PA as detailed above. Please see progress note from today for further details.  Laurance Flatten, MD

## 2023-01-01 NOTE — Progress Notes (Signed)
Pt has hx of OSA on Bipap, pt's HR will drop in the 40's-30's at times while asleep, pt has hx of paroxysmal Afib and is on Metoprolol 25mg ,  On Call MD notified, will continue to monitor, Thanks Lavonda Jumbo RN

## 2023-01-01 NOTE — Plan of Care (Signed)
  Problem: Clinical Measurements: Goal: Will remain free from infection Outcome: Completed/Met   Problem: Nutrition: Goal: Adequate nutrition will be maintained Outcome: Completed/Met   Problem: Pain Managment: Goal: General experience of comfort will improve Outcome: Completed/Met   Problem: Skin Integrity: Goal: Risk for impaired skin integrity will decrease Outcome: Completed/Met   

## 2023-01-01 NOTE — Progress Notes (Signed)
Rounding Note    Patient Name: Robert Warren Date of Encounter: 01/01/2023  [facility] Cardiologist: Thurmon Fair, MD   Subjective   Feels well today. Hoping to go home.  Blood pressure controlled HR drop to 30s overnight while sleeping (has known OSA)  Inpatient Medications    Scheduled Meds:  empagliflozin  10 mg Oral Daily   furosemide  40 mg Oral Daily   losartan  25 mg Oral Daily   metoprolol succinate  25 mg Oral Daily   rosuvastatin  40 mg Oral Daily   sodium chloride flush  3 mL Intravenous Q12H   sodium chloride flush  3 mL Intravenous Q12H   tamsulosin  0.4 mg Oral QPC supper   Continuous Infusions:  sodium chloride     PRN Meds: sodium chloride, acetaminophen **OR** acetaminophen, albuterol, ondansetron **OR** ondansetron (ZOFRAN) IV, sodium chloride flush   Vital Signs    Vitals:   12/31/22 2324 01/01/23 0331 01/01/23 0406 01/01/23 0728  BP: 102/87 114/80  117/78  Pulse: (!) 56 64  60  Resp: 17 18  18   Temp: (!) 97.5 F (36.4 C) 97.8 F (36.6 C)  97.6 F (36.4 C)  TempSrc: Axillary Oral  Oral  SpO2: 94% 98%    Weight:   (!) 155.4 kg   Height:        Intake/Output Summary (Last 24 hours) at 01/01/2023 1002 Last data filed at 01/01/2023 0840 Gross per 24 hour  Intake 297 ml  Output 1075 ml  Net -778 ml       01/01/2023    4:06 AM 12/31/2022    4:47 AM 12/30/2022    4:13 AM  Last 3 Weights  Weight (lbs) 342 lb 9.5 oz 337 lb 15.4 oz 342 lb 5.9 oz  Weight (kg) 155.4 kg 153.3 kg 155.298 kg      Telemetry    NSR with frequent PVCs - Personally Reviewed  ECG    No new tracing - Personally Reviewed  Physical Exam   GEN: No acute distress.   Neck: No JVD Cardiac: RRR, no murmurs, rubs, or gallops. Right radial access site c/d/i Respiratory: Clear to auscultation bilaterally. GI: Soft, nontender, non-distended  MS: No edema, warm Neuro:  Nonfocal  Psych: Normal affect   Labs    High Sensitivity Troponin:  No results  for input(s): "TROPONINIHS" in the last 720 hours.   Chemistry Recent Labs  Lab 12/29/22 0422 12/30/22 0422 12/31/22 0052 12/31/22 0803 12/31/22 0809  NA 139 138 139 142  145 142  K 3.6 3.5 4.0 3.5  3.2* 3.4*  CL 101 103 104  --   --   CO2 28 28 28   --   --   GLUCOSE 117* 102* 96  --   --   BUN 25* 26* 25*  --   --   CREATININE 1.40* 1.33* 1.59*  --   --   CALCIUM 9.2 8.8* 9.0  --   --   MG 2.1 2.2  --   --   --   PROT 7.4  --   --   --   --   ALBUMIN 3.7  --   --   --   --   AST 16  --   --   --   --   ALT 16  --   --   --   --   ALKPHOS 67  --   --   --   --  BILITOT 1.7*  --   --   --   --   GFRNONAA 52* 56* 45*  --   --   ANIONGAP 10 7 7   --   --      Lipids  Recent Labs  Lab 12/31/22 0052  CHOL 132  TRIG 67  HDL 24*  LDLCALC 95  CHOLHDL 5.5     Hematology Recent Labs  Lab 12/27/22 2350 12/28/22 0526 12/29/22 0422 12/31/22 0803 12/31/22 0809  WBC 7.1 6.4 6.7  --   --   RBC 5.37 5.65 5.84*  --   --   HGB 14.5 15.1 15.6 15.3  15.0 15.0  HCT 45.6 48.2 49.5 45.0  44.0 44.0  MCV 84.9 85.3 84.8  --   --   MCH 27.0 26.7 26.7  --   --   MCHC 31.8 31.3 31.5  --   --   RDW 15.8* 15.6* 15.9*  --   --   PLT 184 181 210  --   --     Thyroid No results for input(s): "TSH", "FREET4" in the last 168 hours.  BNP Recent Labs  Lab 12/27/22 2350  BNP 1,870.0*     DDimer No results for input(s): "DDIMER" in the last 168 hours.   Radiology    CARDIAC CATHETERIZATION  Result Date: 12/31/2022   Prox RCA lesion is 30% stenosed.   Mid RCA lesion is 30% stenosed.   Ost LM to Dist LM lesion is 30% stenosed.   Mid LAD to Dist LAD lesion is 30% stenosed. 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 No focally obstructive lesions. Normal right and left heart pressures. Recommendations: Medical management of non-ischemic cardiomyopathy. His Eliquis can be restarted tomorrow. I would favor monitoring him today and repeating BMET tomorrow.    Cardiac Studies    Cardiac Studies & Procedures   CARDIAC CATHETERIZATION  CARDIAC CATHETERIZATION 12/31/2022  Narrative   Prox RCA lesion is 30% stenosed.   Mid RCA lesion is 30% stenosed.   Ost LM to Dist LM lesion is 30% stenosed.   Mid LAD to Dist LAD lesion is 30% stenosed.  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 No focally obstructive lesions. Normal right and left heart pressures.  Recommendations: Medical management of non-ischemic cardiomyopathy. His Eliquis can be restarted tomorrow. I would favor monitoring him today and repeating BMET tomorrow.  Findings Coronary Findings Diagnostic  Dominance: Right  Left Main Ost LM to Dist LM lesion is 30% stenosed.  Left Anterior Descending Mid LAD to Dist LAD lesion is 30% stenosed.  Right Coronary Artery Vessel is large. Prox RCA lesion is 30% stenosed. Mid RCA lesion is 30% stenosed.  Intervention  No interventions have been documented.   STRESS TESTS  MYOCARDIAL PERFUSION IMAGING 06/18/2022  Narrative   LV perfusion is abnormal. There is no evidence of ischemia. There is evidence of infarction. Defect 1: There is a small defect with moderate reduction in uptake present in the mid to basal inferior and inferolateral location(s) that is fixed. There is abnormal wall motion in the defect area. Consistent with infarction.   Left ventricular function is abnormal. Global function is mildly reduced. There was a single regional abnormality. Nuclear stress EF: 40 %. The left ventricular ejection fraction is moderately decreased (30-44%). End diastolic cavity size is moderately enlarged.   Findings are consistent with prior myocardial infarction. The study is intermediate risk.  ECHOCARDIOGRAM  ECHOCARDIOGRAM COMPLETE 12/28/2022  Narrative ECHOCARDIOGRAM REPORT    Patient  Name:   Robert Warren Date of Exam: 12/28/2022 Medical Rec #:  [id number]       Height:       76.0 in Accession #:    [id number]      Weight:       346.6  lb Date of Birth:  [birth year]       BSA:          2.798 m Patient Age:    77 years        BP:           138/77 mmHg Patient Gender: M               HR:           52 bpm. Exam [facility]  Procedure: 2D Echo, Cardiac Doppler and Color Doppler  Indications:    CHF-Acute Systolic I50.21  History:        Patient has prior history of Echocardiogram examinations, most recent 06/09/2022. CHF, Arrythmias:Atrial Fibrillation; Risk Factors:Hypertension, Dyslipidemia and Former Smoker.  Sonographer:    Celesta Gentile RCS Referring Phys: [id number] OLADAPO ADEFESO  IMPRESSIONS   1. Left ventricular ejection fraction, by estimation, is 30 to 35%. The left ventricle has moderately decreased function. The left ventricle demonstrates global hypokinesis. The left ventricular internal cavity size was severely dilated. There is mild concentric left ventricular hypertrophy. Left ventricular diastolic parameters are consistent with Grade I diastolic dysfunction (impaired relaxation). Elevated left atrial pressure. 2. Right ventricular systolic function is low normal. The right ventricular size is normal. 3. Left atrial size was moderately dilated. 4. Right atrial size was mildly dilated. 5. The mitral valve is normal in structure. Trivial mitral valve regurgitation. 6. The aortic valve is tricuspid. Aortic valve regurgitation is not visualized.  Comparison(s): The left ventricular function is worsened.  FINDINGS Left Ventricle: Left ventricular ejection fraction, by estimation, is 30 to 35%. The left ventricle has moderately decreased function. The left ventricle demonstrates global hypokinesis. The left ventricular internal cavity size was severely dilated. There is mild concentric left ventricular hypertrophy. Left ventricular diastolic parameters are consistent with Grade I diastolic dysfunction (impaired relaxation). Elevated left atrial pressure.  Right Ventricle: The right ventricular size  is normal. Right vetricular wall thickness was not assessed. Right ventricular systolic function is low normal.  Left Atrium: Left atrial size was moderately dilated.  Right Atrium: Right atrial size was mildly dilated.  Pericardium: There is no evidence of pericardial effusion.  Mitral Valve: The mitral valve is normal in structure. Trivial mitral valve regurgitation.  Tricuspid Valve: The tricuspid valve is normal in structure. Tricuspid valve regurgitation is trivial.  Aortic Valve: The aortic valve is tricuspid. Aortic valve regurgitation is not visualized.  Pulmonic Valve: The pulmonic valve was normal in structure. Pulmonic valve regurgitation is not visualized.  Aorta: The aortic root is normal in size and structure.  IAS/Shunts: No atrial level shunt detected by color flow Doppler.   LEFT VENTRICLE PLAX 2D LVIDd:         6.40 cm      Diastology LVIDs:         4.90 cm      LV e' medial:    3.44 cm/s LV PW:         1.40 cm      LV E/e' medial:  15.4 LV IVS:        1.40 cm      LV e' lateral:   4.40 cm/s LVOT diam:  2.40 cm      LV E/e' lateral: 12.0 LV SV:         72 LV SV Index:   26 LVOT Area:     4.52 cm  LV Volumes (MOD) LV vol d, MOD A2C: 162.0 ml LV vol d, MOD A4C: 249.0 ml LV vol s, MOD A2C: 104.0 ml LV vol s, MOD A4C: 172.5 ml LV SV MOD A2C:     58.0 ml LV SV MOD A4C:     249.0 ml LV SV MOD BP:      68.1 ml  RIGHT VENTRICLE RV S prime:     15.40 cm/s TAPSE (M-mode): 2.9 cm  LEFT ATRIUM              Index        RIGHT ATRIUM           Index LA diam:        5.60 cm  2.00 cm/m   RA Area:     23.80 cm LA Vol (A2C):   101.0 ml 36.09 ml/m  RA Volume:   82.60 ml  29.52 ml/m LA Vol (A4C):   129.0 ml 46.10 ml/m LA Biplane Vol: 120.0 ml 42.88 ml/m AORTIC VALVE LVOT Vmax:   81.50 cm/s LVOT Vmean:  61.200 cm/s LVOT VTI:    0.160 m  AORTA Ao Root diam: 4.00 cm  MITRAL VALVE MV Area (PHT): 2.34 cm    SHUNTS MV Decel Time: 324 msec    Systemic  VTI:  0.16 m MV E velocity: 52.90 cm/s  Systemic Diam: 2.40 cm MV A velocity: 70.30 cm/s MV E/A ratio:  0.75  Dietrich Pates MD Electronically signed by Dietrich Pates MD Signature Date/Time: 12/28/2022/3:36:23 PM    Final              Patient Profile     77 y.o. male with PMH of paroxysmal atrial fibrillation, PVC's, abnormal stress test (NST in 05/2022 showing evidence of prior inferior infarct but no ischemia), HTN, HLD and OSA (on CPAP) who presented with worsening SOB found to have acute CHF exacerbation with newly reduced EF 30-35% for which Cardiology was consulted.  Assessment & Plan    #Acute Systolic HF: -Patient presented with worsening dyspnea found to have BNP 1870 with pulmonary edema on CXR consistent with acute HF exacerbation -TTE this admission with newly reduced EF 30-35% with global hypokinesis from 50-55% in 05/2022 -LHC/RHC today with mild CAD but no obstructive lesions (has aneurysmal segments of all 3 coronaires) and normal right and left heart filling pressures -Findings consistent with newly diagnosed NICM -Unclear cause (? Higher burden of Afib); will need further work-up as outpatient (consider cardiac monitor +/- CMR) -Will optimize GDMT as able as inpatient -Continue metop 25mg  XL daily -Continue jardiance 10mg  daily -Continue losartan 25mg  daily with goal to transition to entresto as outpatient -Continue spiro 12.5mg  daily today -Change lasix to 40mg  PO daily prn for weight gain/LE edema -Okay to discharge home today  #Paroxysmal Afib: -Maintaining NSR today but has had runs of Afib throughout hospitalization -Resume home apixaban -Continue metop 25mg  XL daily  #HTN: -Continue metop and losartan as above  #HLD: -Continue crestor 40mg  daily as above  Okay to discharge home from a CV perspective on current medications. Lasix has been changed to 40mg  daily as needed for LE edema/weight gain given additional of jardiance and spiro. Will arrange for  outpatient follow-up.  For questions or updates, please contact [facility]  Please consult [email or web address] for contact info under        Signed, Meriam Sprague, MD  01/01/2023, 10:02 AM

## 2023-01-03 NOTE — Progress Notes (Unsigned)
Cardiology Clinic Note   Patient Name: Robert Warren Date of Encounter: 01/05/2023  Primary Care Provider:  Ignatius Specking, MD Primary Cardiologist:  Thurmon Fair, MD  Patient Profile    Robert Warren 77 year old male presents the clinic today for follow-up evaluation of his atrial fibrillation and CHF.  Past Medical History    Past Medical History:  Diagnosis Date   Anxiety    Atrial fibrillation (HCC)    Hypertension    Shortness of breath    Sleep apnea    Past Surgical History:  Procedure Laterality Date   Anal cyst Removal     COLONOSCOPY N/A 06/20/2014   Procedure: COLONOSCOPY;  Surgeon: Malissa Hippo, MD;  Location: [facility];  Service: Endoscopy;  Laterality: N/A;  1030   COLONOSCOPY WITH PROPOFOL N/A 03/14/2021   Procedure: COLONOSCOPY WITH PROPOFOL;  Surgeon: Dolores Frame, MD;  Location: [facility];  Service: Gastroenterology;  Laterality: N/A;  10:50   CYSTOSCOPY     EXTRACORPOREAL SHOCK WAVE LITHOTRIPSY Left 08/04/2022   Procedure: EXTRACORPOREAL SHOCK WAVE LITHOTRIPSY (ESWL);  Surgeon: Malen Gauze, MD;  Location: [facility];  Service: Urology;  Laterality: Left;   GREEN LIGHT LASER TURP (TRANSURETHRAL RESECTION OF PROSTATE     POLYPECTOMY  03/14/2021   Procedure: POLYPECTOMY;  Surgeon: Dolores Frame, MD;  Location: [facility];  Service: Gastroenterology;;   RIGHT/LEFT HEART CATH AND CORONARY ANGIOGRAPHY N/A 12/31/2022   Procedure: RIGHT/LEFT HEART CATH AND CORONARY ANGIOGRAPHY;  Surgeon: Kathleene Hazel, MD;  Location: [facility];  Service: Cardiovascular;  Laterality: N/A;    Allergies  No Known Allergies  History of Present Illness    Robert Warren has a PMH of acute heart failure with preserved ejection fraction, bigeminy, HTN, HLD, atrial fibrillation, CKD, BPH, hyperlipidemia, obesity, and sepsis.  He was seen in follow-up by Dr. Royann Shivers 2/24.  He reported occasional episodes of  dyspnea and denied exertional chest discomfort.  He was noted to have frequent PVCs.  Is felt that they may respond better to a beta-blocker therapy.  His verapamil was discontinued and he was started on metoprolol 50 mg daily.  He was noted to have a history of junctional bradycardia and he was started on a lower dose.  He had been compliant with his anticoagulation.  He was admitted to the hospital on 12/28/2022 and discharged on 01/01/2023.  He presented to [facility] on 12/27/2022.  He reported worsening dyspnea at rest and with activity.  He noted his symptoms most when he was active around his house and with helping his grandchildren.  He reported orthopnea but denied PND.  He reported occasional episodes of chest tightness when he was unable to catch his breath.  The episodes would happen with activity and resolves with rest.  He was admitted with acute CHF exacerbation and received IV diuresis.  He was noted to have 2.1 L of output and his weight went from 350-360-339 at discharge.  His creatinine increased to 1.4.  His blood pressure was noted to be 84/58-135/86.  At discharge his blood pressure was 107/51.  The day prior to discharge she did receive his lisinopril but his metoprolol XL was held.  He presents to the clinic today for follow-up evaluation and states he has been weighing himself regularly.  His weight has been stable.  He is tolerating his medications well.  His blood pressure today is 128/82.  We reviewed his recent hospitalization and medications.  He expressed understanding.  I will increase his losartan to 50 mg daily, repeat a BMP in 1-2 weeks and plan follow-up in 1 to 2 months.  I will give the salty 6 diet sheet  Today he denies chest pain, shortness of breath, lower extremity edema, fatigue, palpitations, melena, hematuria, hemoptysis, diaphoresis, weakness, presyncope, syncope, orthopnea, and PND.    Home Medications    Prior to Admission medications   Medication  Sig Start Date End Date Taking? Authorizing Provider  apixaban (ELIQUIS) 5 MG TABS tablet Take 5 mg by mouth 2 (two) times daily. 09/19/16   [provider]  ARIPiprazole (ABILIFY) 15 MG tablet Take 15 mg by mouth daily. 04/22/22   [provider]  buPROPion (WELLBUTRIN SR) 200 MG 12 hr tablet Take 200 mg by mouth daily as needed (Depression). 12/10/15   [provider]  diclofenac Sodium (VOLTAREN) 1 % GEL Apply 2 g topically as needed (pain). 07/30/16   [provider]  empagliflozin (JARDIANCE) 10 MG TABS tablet Take 1 tablet (10 mg total) by mouth daily. 01/02/23   [facility], Roe Rutherford, PA  furosemide (LASIX) 40 MG tablet Take 1 tablet (40 mg total) by mouth daily as needed for fluid or edema. Take for 3 lb weight gain overnight or a 5 lb weight gain in 1 week. 01/01/23   [facility], Roe Rutherford, PA  latanoprost (XALATAN) 0.005 % ophthalmic solution 1 drop at bedtime. 09/09/21   [provider]  losartan (COZAAR) 25 MG tablet Take 1 tablet (25 mg total) by mouth daily. 01/02/23   [facility], Roe Rutherford, PA  metoprolol succinate (TOPROL-XL) 25 MG 24 hr tablet Take 1 tablet (25 mg total) by mouth daily. 01/02/23   [facility], Roe Rutherford, PA  rosuvastatin (CRESTOR) 40 MG tablet Take 40 mg by mouth daily.    [provider]  spironolactone (ALDACTONE) 25 MG tablet Take 1/2 tablet (12.5 mg total) by mouth daily. 01/01/23 01/01/24  DukeRoe Rutherford, PA    Family History    Family History  Problem Relation Age of Onset   Heart disease Mother    Cancer - Lung Father    Heart attack Father    He indicated that his mother is deceased. He indicated that his father is deceased. He indicated that his brother is deceased.  Social History    Social History   Socioeconomic History   Marital status: Married    Spouse name: Darel Hong   Number of children: 2   Years of education: Not on file   Highest education level: High school graduate  Occupational History    Occupation: Retired  Tobacco Use   Smoking status: Former    Packs/day: 1.00    Years: 30.00    Additional pack years: 0.00    Total pack years: 30.00    Types: Cigarettes    Quit date: 08/23/1996    Years since quitting: 26.3   Smokeless tobacco: Never  Vaping Use   Vaping Use: Never used  Substance and Sexual Activity   Alcohol use: Yes    Comment: occasional   Drug use: No   Sexual activity: Yes  Other Topics Concern   Not on file  Social History Narrative   Not on file   Social Determinants of Health   Financial Resource Strain: Low Risk  (12/31/2022)   Overall Financial Resource Strain (CARDIA)    Difficulty of Paying Living Expenses: Not very hard  Food Insecurity: No Food Insecurity (12/28/2022)   Hunger  Vital Sign    Worried About Programme researcher, broadcasting/film/video in the Last Year: Never true    Ran Out of Food in the Last Year: Never true  Transportation Needs: No Transportation Needs (12/31/2022)   PRAPARE - Administrator, Civil Service (Medical): No    Lack of Transportation (Non-Medical): No  Physical Activity: Not on file  Stress: Not on file  Social Connections: Not on file  Intimate Partner Violence: Not At Risk (12/28/2022)   Humiliation, Afraid, Rape, and Kick questionnaire    Fear of Current or Ex-Partner: No    Emotionally Abused: No    Physically Abused: No    Sexually Abused: No     Review of Systems    General:  No chills, fever, night sweats or weight changes.  Cardiovascular:  No chest pain, dyspnea on exertion, edema, orthopnea, palpitations, paroxysmal nocturnal dyspnea. Dermatological: No rash, lesions/masses Respiratory: No cough, dyspnea Urologic: No hematuria, dysuria Abdominal:   No nausea, vomiting, diarrhea, bright red blood per rectum, melena, or hematemesis Neurologic:  No visual changes, wkns, changes in mental status. All other systems reviewed and are otherwise negative except as noted above.  Physical Exam    VS:  BP 128/82    Pulse 63   Ht 6\' 4"  (1.93 m)   Wt (!) 340 lb 9.6 oz (154.5 kg)   SpO2 95%   BMI 41.46 kg/m  , BMI Body mass index is 41.46 kg/m. GEN: Well nourished, well developed, in no acute distress. HEENT: normal. Neck: Supple, no JVD, carotid bruits, or masses. Cardiac: RRR, no murmurs, rubs, or gallops. No clubbing, cyanosis, edema.  Radials/DP/PT 2+ and equal bilaterally.  Respiratory:  Respirations regular and unlabored, clear to auscultation bilaterally. GI: Soft, nontender, nondistended, BS + x 4. MS: no deformity or atrophy. Skin: warm and dry, no rash. Neuro:  Strength and sensation are intact. Psych: Normal affect.  Accessory Clinical Findings    Recent Labs: 12/27/2022: B Natriuretic Peptide 1,870.0 12/29/2022: ALT 16 12/30/2022: Magnesium 2.2 01/01/2023: BUN 21; Creatinine, Ser 1.29; Hemoglobin 14.5; Platelets 207; Potassium 4.2; Sodium 142   Recent Lipid Panel    Component Value Date/Time   CHOL 132 12/31/2022 0052   TRIG 67 12/31/2022 0052   HDL 24 (L) 12/31/2022 0052   CHOLHDL 5.5 12/31/2022 0052   VLDL 13 12/31/2022 0052   LDLCALC 95 12/31/2022 0052         ECG personally reviewed by me today-none today.  Echocardiogram 12/28/2022  IMPRESSIONS     1. Left ventricular ejection fraction, by estimation, is 30 to 35%. The  left ventricle has moderately decreased function. The left ventricle  demonstrates global hypokinesis. The left ventricular internal cavity size  was severely dilated. There is mild  concentric left ventricular hypertrophy. Left ventricular diastolic  parameters are consistent with Grade I diastolic dysfunction (impaired  relaxation). Elevated left atrial pressure.   2. Right ventricular systolic function is low normal. The right  ventricular size is normal.   3. Left atrial size was moderately dilated.   4. Right atrial size was mildly dilated.   5. The mitral valve is normal in structure. Trivial mitral valve  regurgitation.   6. The aortic  valve is tricuspid. Aortic valve regurgitation is not  visualized.   Comparison(s): The left ventricular function is worsened.   FINDINGS   Left Ventricle: Left ventricular ejection fraction, by estimation, is 30  to 35%. The left ventricle has moderately decreased function. The left  ventricle demonstrates global hypokinesis. The left ventricular internal  cavity size was severely dilated.  There is mild concentric left ventricular hypertrophy. Left ventricular  diastolic parameters are consistent with Grade I diastolic dysfunction  (impaired relaxation). Elevated left atrial pressure.   Right Ventricle: The right ventricular size is normal. Right vetricular  wall thickness was not assessed. Right ventricular systolic function is  low normal.   Left Atrium: Left atrial size was moderately dilated.   Right Atrium: Right atrial size was mildly dilated.   Pericardium: There is no evidence of pericardial effusion.   Mitral Valve: The mitral valve is normal in structure. Trivial mitral  valve regurgitation.   Tricuspid Valve: The tricuspid valve is normal in structure. Tricuspid  valve regurgitation is trivial.   Aortic Valve: The aortic valve is tricuspid. Aortic valve regurgitation is  not visualized.   Pulmonic Valve: The pulmonic valve was normal in structure. Pulmonic valve  regurgitation is not visualized.   Aorta: The aortic root is normal in size and structure.   IAS/Shunts: No atrial level shunt detected by color flow Doppler.   Assessment & Plan   1.  Acute systolic CHF, NICM-weight stable.  NYHA class II.  Euvolemic.  Echocardiogram during recent admission showed an LVEF of 30-35%.  Tolerating medical therapy well. Continue metoprolol, Jardiance, losartan, spironolactone, furosemide as needed Increase losartan to 50 mg daily Continue daily weights BMP in 1 to 2 weeks  Paroxysmal atrial fibrillation-heart rate today 63 bpm.  Regular.  Reports compliance with  apixaban.  Denies bleeding issues. Continue metoprolol, apixaban Avoid triggers caffeine, chocolate, EtOH, dehydration etc.  Essential hypertension-BP today 128/82. Maintain blood pressure log Continue current medical therapy  Hyperlipidemia-LDL 95 on 12/31/2022. Continue rosuvastatin High-fiber diet Follows with PCP  Disposition: Follow-up with Dr. Royann Shivers or me in 3-4 months.   Thomasene Ripple. Lorrie Strauch NP-C     01/05/2023, 2:52 PM [facility] [address] Office [phone] Fax [phone]    I spent 14 minutes examining this patient, reviewing medications, and using patient centered shared decision making involving her cardiac care.  Prior to her visit I spent greater than 20 minutes reviewing her past medical history,  medications, and prior cardiac tests.

## 2023-01-04 ENCOUNTER — Other Ambulatory Visit (HOSPITAL_COMMUNITY): Payer: Self-pay

## 2023-01-05 ENCOUNTER — Ambulatory Visit (INDEPENDENT_AMBULATORY_CARE_PROVIDER_SITE_OTHER): Payer: Medicare Other | Admitting: General Practice

## 2023-01-05 ENCOUNTER — Encounter: Payer: Self-pay | Admitting: General Practice

## 2023-01-05 ENCOUNTER — Ambulatory Visit (HOSPITAL_COMMUNITY)
Admission: RE | Admit: 2023-01-05 | Discharge: 2023-01-05 | Disposition: A | Discharge status: Home / Self Care | Payer: Medicare Other | Source: Ambulatory Visit | Attending: Urology | Admitting: Urology

## 2023-01-05 VITALS — BP 128/82 | HR 63 | Ht 76.0 in | Wt 340.6 lb

## 2023-01-05 DIAGNOSIS — E785 Hyperlipidemia, unspecified: Secondary | ICD-10-CM

## 2023-01-05 DIAGNOSIS — K429 Umbilical hernia without obstruction or gangrene: Secondary | ICD-10-CM | POA: Diagnosis not present

## 2023-01-05 DIAGNOSIS — I5031 Acute diastolic (congestive) heart failure: Secondary | ICD-10-CM | POA: Insufficient documentation

## 2023-01-05 DIAGNOSIS — Z79899 Other long term (current) drug therapy: Secondary | ICD-10-CM | POA: Insufficient documentation

## 2023-01-05 DIAGNOSIS — I428 Other cardiomyopathies: Secondary | ICD-10-CM | POA: Insufficient documentation

## 2023-01-05 DIAGNOSIS — I480 Paroxysmal atrial fibrillation: Secondary | ICD-10-CM

## 2023-01-05 DIAGNOSIS — N200 Calculus of kidney: Secondary | ICD-10-CM | POA: Diagnosis not present

## 2023-01-05 DIAGNOSIS — I10 Essential (primary) hypertension: Secondary | ICD-10-CM | POA: Insufficient documentation

## 2023-01-05 MED ORDER — LOSARTAN POTASSIUM 50 MG PO TABS: 50.0000 mg | ORAL_TABLET | Freq: Every day | ORAL | 3 refills | Status: DC

## 2023-01-05 NOTE — Patient Instructions (Signed)
Medication Instructions:  INCREASE LOSARTAN 50MG  DAILY *If you need a refill on your cardiac medications before your next appointment, please call your pharmacy*  Lab Work: BMET IN 1-2 WEEKS If you have labs (blood work) drawn today and your tests are completely normal, you will receive your results only by: MyChart Message (if you have MyChart) OR A paper copy in the mail If you have any lab test that is abnormal or we need to change your treatment, we will call you to review the results.   Testing/Procedures: NONE  Follow-Up: At [facility], you and your health needs are our priority.  As part of our continuing mission to provide you with exceptional heart care, we have created designated Provider Care Teams.  These Care Teams include your primary Cardiologist (physician) and Advanced Practice Providers (APPs -  Physician Assistants and Nurse Practitioners) who all work together to provide you with the care you need, when you need it.  Your next appointment:   1-2 month(s)  Provider:   Edd Fabian, FNP-C    Other Instructions TAKE AND LOG YOUR WEIGHT DAILY  TAKE AND LOG YOUR WEIGHT DAILY  OUR FAX NUMBER IS [phone]

## 2023-01-06 ENCOUNTER — Telehealth (HOSPITAL_COMMUNITY): Payer: Self-pay

## 2023-01-06 ENCOUNTER — Other Ambulatory Visit (HOSPITAL_COMMUNITY): Payer: Self-pay

## 2023-01-06 DIAGNOSIS — I429 Cardiomyopathy, unspecified: Secondary | ICD-10-CM | POA: Diagnosis not present

## 2023-01-06 DIAGNOSIS — Z299 Encounter for prophylactic measures, unspecified: Secondary | ICD-10-CM | POA: Diagnosis not present

## 2023-01-06 DIAGNOSIS — I5022 Chronic systolic (congestive) heart failure: Secondary | ICD-10-CM | POA: Diagnosis not present

## 2023-01-06 DIAGNOSIS — I10 Essential (primary) hypertension: Secondary | ICD-10-CM | POA: Diagnosis not present

## 2023-01-06 DIAGNOSIS — E261 Secondary hyperaldosteronism: Secondary | ICD-10-CM | POA: Diagnosis not present

## 2023-01-06 DIAGNOSIS — Z09 Encounter for follow-up examination after completed treatment for conditions other than malignant neoplasm: Secondary | ICD-10-CM | POA: Diagnosis not present

## 2023-01-06 NOTE — Telephone Encounter (Signed)
Heart Failure Patient Advocate Encounter  Medication assistance forms for Jardiance have been started. Patient has signed forms.  Provider information will need to be completed before submitting.  Forms have been attached to patient chart under 'Media' tab. Routing to appropriate office.  Stephaine H, CPhT Rx Patient Advocate Phone: [phone] 

## 2023-01-07 ENCOUNTER — Ambulatory Visit (HOSPITAL_COMMUNITY): Payer: Medicare Other

## 2023-01-08 ENCOUNTER — Institutional Professional Consult (permissible substitution): Payer: Medicare Other | Admitting: Pulmonary Disease

## 2023-01-15 ENCOUNTER — Encounter: Payer: Self-pay | Admitting: Urology

## 2023-01-15 ENCOUNTER — Ambulatory Visit (INDEPENDENT_AMBULATORY_CARE_PROVIDER_SITE_OTHER): Payer: Medicare Other | Admitting: Urology

## 2023-01-15 VITALS — BP 91/62 | HR 46

## 2023-01-15 DIAGNOSIS — N200 Calculus of kidney: Secondary | ICD-10-CM | POA: Diagnosis not present

## 2023-01-15 LAB — URINALYSIS, ROUTINE W REFLEX MICROSCOPIC
Bilirubin, UA: NEGATIVE
Ketones, UA: NEGATIVE
Leukocytes,UA: NEGATIVE
Nitrite, UA: NEGATIVE
Specific Gravity, UA: 1.025 (ref 1.005–1.030)
Urobilinogen, Ur: 1 mg/dL (ref 0.2–1.0)
pH, UA: 5.5 (ref 5.0–7.5)

## 2023-01-15 LAB — MICROSCOPIC EXAMINATION: Bacteria, UA: NONE SEEN

## 2023-01-15 NOTE — Patient Instructions (Signed)
Ureteroscopy  Ureteroscopy is a procedure to check for and treat problems inside part of the urinary tract. In this procedure, a long rigid or flexible tube with a lens and light at the end (ureteroscope) is used to look at the inside of the kidneys and the ureters. The ureters are the tubes that carry urine from the kidneys to the bladder. The ureteroscope is inserted into one or both of the ureters. You may need this procedure if you have frequent urinary tract infections (UTIs), blood in your urine, or a stone in one or both of your ureters. A ureteroscopy can be done: To find the cause of urine blockage in a ureter and to evaluate other abnormalities inside the ureters or kidneys. To remove stones. To remove or treat growths of tissue (polyps), abnormal tissue, and some types of tumors. To remove a tissue sample and check it for disease under a microscope (biopsy). Tell a health care provider about: Any allergies you have. All medicines you are taking, including vitamins, herbs, eye drops, creams, and over-the-counter medicines. Any problems you or family members have had with anesthetic medicines. Any bleeding problems you have. Any surgeries you have had. Any medical conditions you have. Whether you are pregnant or may be pregnant. What are the risks? Your health care provider will talk with you about risks. These may include: Abdominal pain or a burning feeling or pain while urinating. Abnormal bleeding. A UTI. Allergic reactions to medicines. Scarring that narrows the ureter (stricture) or swelling. Creating a hole (perforation) in the ureter. Damage to other structures or organs, such as the part of your body that drains urine from your bladder (urethra), your bladder, or your uterus. What happens before the procedure? When to stop eating and drinking  8 hours before your procedure Stop eating most foods. Do not eat meat, fried foods, or fatty foods. Eat only light foods, such  as toast or crackers. All liquids are okay except energy drinks and alcohol. 6 hours before your procedure Stop eating. Drink only clear liquids, such as water, clear fruit juice, black coffee, plain tea, and sports drinks. Do not drink energy drinks or alcohol. 2 hours before your procedure Stop drinking all liquids. You may be allowed to take medicines with small sips of water. Medicines Ask your health care provider about: Changing or stopping your regular medicines. These include any diabetes medicines or blood thinners you take. Taking medicines such as aspirin and ibuprofen. These medicines can thin your blood. Do not take these medicines unless your health care provider tells you to. Taking over-the-counter medicines, vitamins, herbs, and supplements. General instructions Do not use any products that contain nicotine or tobacco for at least 4 weeks before the procedure. These products include cigarettes, chewing tobacco, and vaping devices, such as e-cigarettes. If you need help quitting, ask your health care provider. If you will be going home right after the procedure, plan to have a responsible adult: Take you home from the hospital or clinic. You will not be allowed to drive. Care for you for the time you are told. Ask your health care provider what steps will be taken to help prevent infection. These may include: Washing skin with a soap that kills germs. Receiving antibiotic medicine. Tests You may have an exam or testing. You may have a urine sample taken to check for infection. What happens during the procedure? An IV will be inserted into one of your veins. You may be given: A sedative. This helps  you relax. Anesthesia. This will: Numb certain areas of your body. Make you fall asleep for surgery. Your urethra will be cleaned with a germ-killing solution. The ureteroscope will be passed through your urethra into your bladder. A salt-water solution will be sent through  the ureteroscope to fill your bladder. This will help the health care provider see the openings of your ureters more clearly. The ureteroscope will be passed into your ureter. If a growth is found, a biopsy may be done. If a stone is found, it may be removed through the ureteroscope, or the stone may be broken up using a laser, shock waves, or electrical energy. In some cases, if the ureter is too small, a tube may be inserted that keeps the ureter open (ureteral stent). The stent may be left in place for 1 or 2 weeks, and then the ureteroscopy procedure will be done again. The scope will be removed, and your bladder will be emptied. The procedure may vary among health care providers and hospitals. What happens after the procedure? Your blood pressure, heart rate, breathing rate, and blood oxygen level will be monitored until you leave the hospital or clinic. It is up to you to get the results of your procedure. Ask your health care provider, or the department that is doing the procedure, when your results will be ready. Summary Ureteroscopy is a procedure used to look at the inside of the kidneys and the ureters. You may need this procedure if you have frequent urinary tract infections (UTIs), blood in your urine, or a stone in one or both of your ureters. Follow instructions from your health care provider about eating and drinking. In some cases, if the ureter is too small, a tube may be inserted that keeps the ureter open (ureteral stent). The stent may be left in place for 1 or 2 weeks to keep the ureter open, and then the ureteroscopy procedure will be done again. This information is not intended to replace advice given to you by your health care provider. Make sure you discuss any questions you have with your health care provider. Document Revised: 07/13/2022 Document Reviewed: 07/13/2022 Elsevier Patient Education  2024 [employer].

## 2023-01-15 NOTE — Progress Notes (Signed)
01/15/2023 12:07 PM   Worthy Flank [birth year] [id number]  Referring provider: Ignatius Specking, MD [address] [place],  [place] [address]  Chief Complaint  Patient presents with   CT stone    review    HPI: Mr Atlee is a 77yo here for followup for nephrolithiasis. No stone events since last visit. CT stone study from 5/17 shows multiple left distal ureteral calculus. No flank pain. No LUTS. No other complaints today   PMH: Past Medical History:  Diagnosis Date   Anxiety    Atrial fibrillation (HCC)    Hypertension    Shortness of breath    Sleep apnea     Surgical History: Past Surgical History:  Procedure Laterality Date   Anal cyst Removal     COLONOSCOPY N/A 06/20/2014   Procedure: COLONOSCOPY;  Surgeon: Malissa Hippo, MD;  Location: [facility];  Service: Endoscopy;  Laterality: N/A;  1030   COLONOSCOPY WITH PROPOFOL N/A 03/14/2021   Procedure: COLONOSCOPY WITH PROPOFOL;  Surgeon: Dolores Frame, MD;  Location: [facility];  Service: Gastroenterology;  Laterality: N/A;  10:50   CYSTOSCOPY     EXTRACORPOREAL SHOCK WAVE LITHOTRIPSY Left 08/04/2022   Procedure: EXTRACORPOREAL SHOCK WAVE LITHOTRIPSY (ESWL);  Surgeon: Malen Gauze, MD;  Location: [facility];  Service: Urology;  Laterality: Left;   GREEN LIGHT LASER TURP (TRANSURETHRAL RESECTION OF PROSTATE     POLYPECTOMY  03/14/2021   Procedure: POLYPECTOMY;  Surgeon: Dolores Frame, MD;  Location: [facility];  Service: Gastroenterology;;   RIGHT/LEFT HEART CATH AND CORONARY ANGIOGRAPHY N/A 12/31/2022   Procedure: RIGHT/LEFT HEART CATH AND CORONARY ANGIOGRAPHY;  Surgeon: Kathleene Hazel, MD;  Location: [facility];  Service: Cardiovascular;  Laterality: N/A;    Home Medications:  Allergies as of 01/15/2023   No Known Allergies      Medication List        Accurate as of Jan 15, 2023 12:07 PM. If you have any questions, ask your nurse or doctor.           ARIPiprazole 15 MG tablet Commonly known as: ABILIFY Take 15 mg by mouth daily.   buPROPion 200 MG 12 hr tablet Commonly known as: WELLBUTRIN SR Take 200 mg by mouth daily as needed (Depression).   diclofenac Sodium 1 % Gel Commonly known as: VOLTAREN Apply 2 g topically as needed (pain).   Eliquis 5 MG Tabs tablet Generic drug: apixaban Take 5 mg by mouth 2 (two) times daily.   furosemide 40 MG tablet Commonly known as: LASIX Take 1 tablet (40 mg total) by mouth daily as needed for fluid or edema. Take for 3 lb weight gain overnight or a 5 lb weight gain in 1 week.   Jardiance 10 MG Tabs tablet Generic drug: empagliflozin Take 1 tablet (10 mg total) by mouth daily.   latanoprost 0.005 % ophthalmic solution Commonly known as: XALATAN 1 drop at bedtime.   losartan 50 MG tablet Commonly known as: COZAAR Take 1 tablet (50 mg total) by mouth daily.   metoprolol succinate 25 MG 24 hr tablet Commonly known as: TOPROL-XL Take 1 tablet (25 mg total) by mouth daily.   rosuvastatin 40 MG tablet Commonly known as: CRESTOR Take 40 mg by mouth daily.   spironolactone 25 MG tablet Commonly known as: Aldactone Take 1/2 tablet (12.5 mg total) by mouth daily.        Allergies: No Known Allergies  Family History: Family History  Problem  Relation Age of Onset   Heart disease Mother    Cancer - Lung Father    Heart attack Father     Social History:  reports that he quit smoking about 26 years ago. His smoking use included cigarettes. He has a 30.00 pack-year smoking history. He has never used smokeless tobacco. He reports current alcohol use. He reports that he does not use drugs.  ROS: All other review of systems were reviewed and are negative except what is noted above in HPI  Physical Exam: BP 91/62   Pulse (!) 46   Constitutional:  Alert and oriented, No acute distress. HEENT: [place] AT, moist mucus membranes.  Trachea midline, no masses. Cardiovascular: No  clubbing, cyanosis, or edema. Respiratory: Normal respiratory effort, no increased work of breathing. GI: Abdomen is soft, nontender, nondistended, no abdominal masses GU: No CVA tenderness.  Lymph: No cervical or inguinal lymphadenopathy. Skin: No rashes, bruises or suspicious lesions. Neurologic: Grossly intact, no focal deficits, moving all 4 extremities. Psychiatric: Normal mood and affect.  Laboratory Data: Lab Results  Component Value Date   WBC 4.9 01/01/2023   HGB 14.5 01/01/2023   HCT 46.0 01/01/2023   MCV 84.9 01/01/2023   PLT 207 01/01/2023    Lab Results  Component Value Date   CREATININE 1.29 (H) 01/01/2023    No results found for: "PSA"  No results found for: "TESTOSTERONE"  No results found for: "HGBA1C"  Urinalysis    Component Value Date/Time   COLORURINE AMBER (A) 04/03/2022 1409   APPEARANCEUR Clear 10/05/2022 1103   LABSPEC 1.025 04/03/2022 1409   PHURINE 5.0 04/03/2022 1409   GLUCOSEU Negative 10/05/2022 1103   HGBUR LARGE (A) 04/03/2022 1409   BILIRUBINUR Negative 10/05/2022 1103   KETONESUR NEGATIVE 04/03/2022 1409   PROTEINUR 3+ (A) 10/05/2022 1103   PROTEINUR >=300 (A) 04/03/2022 1409   NITRITE Negative 10/05/2022 1103   NITRITE NEGATIVE 04/03/2022 1409   LEUKOCYTESUR Negative 10/05/2022 1103   LEUKOCYTESUR MODERATE (A) 04/03/2022 1409    Lab Results  Component Value Date   LABMICR See below: 10/05/2022   WBCUA 0-5 10/05/2022   LABEPIT 0-10 10/05/2022   MUCUS Present 04/23/2022   BACTERIA None seen 10/05/2022    Pertinent Imaging: CT 01/08/2023: Images reviewed and discussed with the patient  Results for orders placed during the hospital encounter of 10/05/22  Abdomen 1 view (KUB)  Narrative CLINICAL DATA:  Nephrolithiasis.  EXAM: ABDOMEN - 1 VIEW  COMPARISON:  08/25/2022.  FINDINGS: The bowel gas pattern is normal. No renal or ureteral calculus is seen bilaterally. Degenerative changes in the thoracolumbar  spine.  IMPRESSION: No renal or ureteral calculus.   Electronically Signed By: Thornell Sartorius M.D. On: 10/06/2022 02:10  No results found for this or any previous visit.  No results found for this or any previous visit.  No results found for this or any previous visit.  Results for orders placed during the hospital encounter of 08/06/22  US RENAL  Narrative CLINICAL DATA:  Follow-up kidney stones after ESWL.  EXAM: RENAL / URINARY TRACT ULTRASOUND COMPLETE  COMPARISON:  CT scan of the abdomen and pelvis without contrast April 03, 2022  FINDINGS: Right Kidney:  Renal measurements: 13.9 x 5.6 x 6.9 cm = volume: 281 mL. Echogenicity within normal limits. No mass or hydronephrosis visualized.  Left Kidney:  Renal measurements: 13.8 x 6.8 x 6.1 cm = volume: 3 L1 mL. There is a shadowing mildly echogenic region identified in the mid to lower  left kidney measuring up to 16 mm suggestive of a nonobstructive stone. Contains 2 adjacent cysts measuring 2.4 and 2.0 cm. No follow-up imaging recommended for the cysts.  Bladder:  Appears normal for degree of bladder distention.  Other:  None.  IMPRESSION: 1. 16 mm nonobstructive stone in the mid to lower left kidney. 2. No other significant abnormalities.   Electronically Signed By: Gerome Sam III M.D. On: 08/07/2022 13:40  No valid procedures specified. No results found for this or any previous visit.  Results for orders placed during the hospital encounter of 01/05/23  CT RENAL STONE STUDY  Narrative CLINICAL DATA:  Follow-up renal calculi post treatment (ESWL).  EXAM: CT ABDOMEN AND PELVIS WITHOUT CONTRAST  TECHNIQUE: Multidetector CT imaging of the abdomen and pelvis was performed following the standard protocol without IV contrast.  RADIATION DOSE REDUCTION: This exam was performed according to the departmental dose-optimization program which includes automated exposure control, adjustment of  the mA and/or kV according to patient size and/or use of iterative reconstruction technique.  COMPARISON:  04/03/2022  FINDINGS: Lower chest: The lung bases are clear of acute process. No pleural effusion or pulmonary lesions. Moderate eventration of the right hemidiaphragm with overlying vascular crowding and streaky atelectasis. The heart is normal in size. No pericardial effusion. The distal esophagus and aorta are unremarkable.  Hepatobiliary: No hepatic lesions or intrahepatic biliary dilatation. Gallbladder is grossly normal. No common bile duct dilatation.  Pancreas: No mass, inflammation or ductal dilatation.  Spleen: Normal size.  No focal lesions.  Adrenals/Urinary Tract: The adrenal glands are normal.  The interpolar left renal calculus is no longer identified. No small stone fragments. No right-sided renal calculi.  No left-sided hydroureteronephrosis but there are nonobstructing stone fragments in the distal left ureter. Suspect 3 or 4 stone fragments. The stone fragment in the UVJ measures approximately 6 mm. There are 2 smaller more proximal calculi and a larger most proximal calculus measuring 6 mm.  No bladder calculi.  Stomach/Bowel: The stomach, duodenum, small bowel and colon grossly normal. The terminal ileum and appendix are normal. There is scattered colonic diverticulosis but no findings for acute diverticulitis.  Vascular/Lymphatic: Normal caliber abdominal aorta. Minimal scattered atherosclerotic calcifications. No mesenteric or retroperitoneal mass or adenopathy.  Reproductive: The prostate gland and seminal vesicles are unremarkable. Suspect prior TURP.  Other: No pelvic mass or adenopathy. No free pelvic fluid collections. No inguinal mass or adenopathy. Small periumbilical abdominal wall hernia containing fat and supraumbilical abdominal wall hernia containing fat. This is located approximately 8 cm above the  umbilicus.  Musculoskeletal: No significant bony findings. Stable degenerative changes involving the spine.  IMPRESSION: 1. The interpolar left renal calculus is no longer identified. No residual small stone fragments in the kidney. 2. There are 3 or 4 nonobstructing stone fragments in the distal left ureter. The stone fragment in the UVJ measures approximately 6 mm. There are 2 smaller more proximal calculi and a larger most proximal calculus measuring 6 mm. 3. No right-sided renal or ureteral calculi. 4. No acute abdominal/pelvic findings, mass lesions or adenopathy. 5. Small periumbilical abdominal wall hernia containing fat and supraumbilical abdominal wall hernia containing fat.   Electronically Signed By: Rudie Meyer M.D. On: 01/08/2023 09:57   Assessment & Plan:    1. Kidney stone -We discussed the management of kidney stones. These options include observation, ureteroscopy, shockwave lithotripsy (ESWL) and percutaneous nephrolithotomy (PCNL). We discussed which options are relevant to the patient's stone(s). We discussed the natural history  of kidney stones as well as the complications of untreated stones and the impact on quality of life without treatment as well as with each of the above listed treatments. We also discussed the efficacy of each treatment in its ability to clear the stone burden. With any of these management options I discussed the signs and symptoms of infection and the need for emergent treatment should these be experienced. For each option we discussed the ability of each procedure to clear the patient of their stone burden.   For observation I described the risks which include but are not limited to silent renal damage, life-threatening infection, need for emergent surgery, failure to pass stone and pain.   For ureteroscopy I described the risks which include bleeding, infection, damage to contiguous structures, positioning injury, ureteral stricture,  ureteral avulsion, ureteral injury, need for prolonged ureteral stent, inability to perform ureteroscopy, need for an interval procedure, inability to clear stone burden, stent discomfort/pain, heart attack, stroke, pulmonary embolus and the inherent risks with general anesthesia.   For shockwave lithotripsy I described the risks which include arrhythmia, kidney contusion, kidney hemorrhage, need for transfusion, pain, inability to adequately break up stone, inability to pass stone fragments, Steinstrasse, infection associated with obstructing stones, need for alternate surgical procedure, need for repeat shockwave lithotripsy, MI, CVA, PE and the inherent risks with anesthesia/conscious sedation.   For PCNL I described the risks including positioning injury, pneumothorax, hydrothorax, need for chest tube, inability to clear stone burden, renal laceration, arterial venous fistula or malformation, need for embolization of kidney, loss of kidney or renal function, need for repeat procedure, need for prolonged nephrostomy tube, ureteral avulsion, MI, CVA, PE and the inherent risks of general anesthesia.   - The patient would like to proceed with left ureteroscopic stone extraction - Urinalysis, Routine w reflex microscopic   No follow-ups on file.  Wilkie Aye, MD  [facility] [place]

## 2023-01-19 ENCOUNTER — Telehealth: Payer: Self-pay

## 2023-01-19 NOTE — Telephone Encounter (Signed)
I spoke with Robert Warren. We have discussed possible surgery dates and 02/18/2023 was agreed upon by all parties. Patient given information about surgery date, what to expect pre-operatively and post operatively.    We discussed that a pre-op nurse will be calling to set up the pre-op visit that will take place prior to surgery. Informed patient that our office will communicate any additional care to be provided after surgery.    Patients questions or concerns were discussed during our call. Advised to call our office should there be any additional information, questions or concerns that arise. Patient verbalized understanding.

## 2023-01-19 NOTE — Progress Notes (Signed)
[facility] Transitions of Care Clinic Heart Failure Pharmacist Encounter  PCP: Ignatius Specking, MD PCP-Cardiologist: Thurmon Fair, MD  HPI:   77 YO male with a PMH of atrial fibrillation with RVR, OSA, morbid obesity, hypertension, hyperlipidemia.    Patient presented to the ED on 5/5 with complaints of shortness of breath that is worse on exertion and when lying flat. Patient recently saw his PCP for these same symptoms and was prescribed antibiotics/steroids for presumed bronchitis with no significant improvement. Echo from 06/09/22 showed EF 50-55%, no wall motion abnormalities, G1DD, and normal RV function. Repeat echo showing EF 30-35% with global hypokinesis, mild LV hypertrophy, G1DD, low-normal RV function and trivial MVR. ReDS vest reading 32%. R/LHC from 5/9 showing mild stenoses (30%) of RCA, LM, and LAD; CO/CI 5.45/1.96, RAP 5 mmHg, PAP 19 mmHg. Patient on metoprolol succinate outpatient for frequent PVCs and lisinopril--he reported compliance with both. He was discharged from the hospital on 01/01/23.  Patient was recently seen by the cardiology clinic on 5/14 where his losartan was increased to 50 mg daily. Patient reported stable weight since hospital discharge and reported tolerating his medications well.   Today, Robert Warren presents to the Heart Failure TOC Clinic for follow up.   Shortness of breath/dyspnea on exertion? {YES NO:22349}  Orthopnea/PND? {YES NO:22349} Edema? {YES NO:22349} Lightheadedness/dizziness? {YES NO:22349} Daily weights at home? {YES NO:22349} Blood pressure/heart rate monitoring at home? {YES [id number] Following low-sodium/fluid-restricted diet? {YES NO:22349} Taking medications as prescribed? {YES NO:22349}  HF Medications: Diuretic: furosemide 40 mg daily PRN for fluid/edema Beta Blocker: metoprolol succinate 50 mg daily ACE/ARB/ARNI: losartan 50 mg daily MRA: spironolactone 12.5 mg daily SGLT2i: Jardiance 10 mg  daily  Has the patient been experiencing any side effects to the medications prescribed?  {YES NO:22349}  Does the patient have any problems obtaining medications due to transportation or finances?   {YES NO:22349}  Understanding of regimen: {excellent/good/fair/poor:19665} Understanding of indications: {excellent/good/fair/poor:19665} Potential of compliance: {excellent/good/fair/poor:19665} Patient understands to avoid NSAIDs. Patient understands to avoid decongestants.   Pertinent Lab Values: Serum creatinine ***, BUN ***, Potassium ***, Sodium ***, BNP ***, Magnesium ***, Digoxin ***   Vital Signs: Weight: *** lbs (discharge weight: *** lbs) Blood pressure: ***  Heart rate: ***   Medication Assistance / Insurance Benefits Check: Does the patient have prescription insurance?  Yes Type of insurance plan: Medicare  Does the patient qualify for medication assistance through manufacturers or grants?   Yes Eligible grants and/or patient assistance programs: Jardiance  Medication assistance applications in progress: Jardiance  Medication assistance applications approved: *** Approved medication assistance renewals will be completed by: [facility]  Outpatient Pharmacy:  Current outpatient pharmacy: [facility] Was the [facility] pharmacy used to supply discharge medications? yes  If TOC pharmacy was used, were the refills transferred out to current pharmacy yet? yes  Is the patient willing to transition their outpatient pharmacy to utilize a [facility] outpatient pharmacy with or without mail order?   {Yes/No/Pending:24180}  Assessment: 1) Chronic systolic CHF (EF 16-10), due to NICM. NYHA class II symptoms. - Continue furosemide 40 mg daily PRN - Continue metoprolol succinate 50 mg daily? Was supposed to be 25 mg daily at hospital discharge  - Continue losartan 50 mg daily if BP remains stable and consider transitioning to Entresto  - Continue spironolactone 12.5 mg daily - Continue Jardiance  10 mg daily  - F/u BMET today according to recent cardiology appt on 5/14  Plan: 1) Medication changes: -  2) Patient Assistance: - Jardiance patient assistance   3) Follow up: - Next appointment with *** on ***   Cherylin Mylar, PharmD PGY1 Pharmacy Resident 5/28/202410:15 PM

## 2023-01-20 ENCOUNTER — Encounter (HOSPITAL_COMMUNITY): Payer: Self-pay

## 2023-01-20 ENCOUNTER — Other Ambulatory Visit (HOSPITAL_COMMUNITY): Payer: Self-pay

## 2023-01-20 ENCOUNTER — Ambulatory Visit (HOSPITAL_COMMUNITY)
Admission: RE | Admit: 2023-01-20 | Discharge: 2023-01-20 | Disposition: A | Discharge status: Home / Self Care | Payer: Medicare Other | Source: Ambulatory Visit | Attending: Cardiology | Admitting: Cardiology

## 2023-01-20 VITALS — BP 124/78 | HR 57 | Wt 341.0 lb

## 2023-01-20 DIAGNOSIS — I480 Paroxysmal atrial fibrillation: Secondary | ICD-10-CM | POA: Insufficient documentation

## 2023-01-20 DIAGNOSIS — I5022 Chronic systolic (congestive) heart failure: Secondary | ICD-10-CM | POA: Diagnosis not present

## 2023-01-20 DIAGNOSIS — Z87891 Personal history of nicotine dependence: Secondary | ICD-10-CM | POA: Insufficient documentation

## 2023-01-20 DIAGNOSIS — Z79899 Other long term (current) drug therapy: Secondary | ICD-10-CM | POA: Insufficient documentation

## 2023-01-20 DIAGNOSIS — I2510 Atherosclerotic heart disease of native coronary artery without angina pectoris: Secondary | ICD-10-CM | POA: Diagnosis not present

## 2023-01-20 DIAGNOSIS — I428 Other cardiomyopathies: Secondary | ICD-10-CM | POA: Diagnosis not present

## 2023-01-20 DIAGNOSIS — Z8249 Family history of ischemic heart disease and other diseases of the circulatory system: Secondary | ICD-10-CM | POA: Insufficient documentation

## 2023-01-20 DIAGNOSIS — G4733 Obstructive sleep apnea (adult) (pediatric): Secondary | ICD-10-CM | POA: Diagnosis not present

## 2023-01-20 DIAGNOSIS — Z7901 Long term (current) use of anticoagulants: Secondary | ICD-10-CM | POA: Diagnosis not present

## 2023-01-20 DIAGNOSIS — I110 Hypertensive heart disease with heart failure: Secondary | ICD-10-CM | POA: Diagnosis not present

## 2023-01-20 DIAGNOSIS — I493 Ventricular premature depolarization: Secondary | ICD-10-CM | POA: Insufficient documentation

## 2023-01-20 LAB — BASIC METABOLIC PANEL
Anion gap: 9 (ref 5–15)
BUN: 11 mg/dL (ref 8–23)
CO2: 27 mmol/L (ref 22–32)
Calcium: 9.4 mg/dL (ref 8.9–10.3)
Chloride: 104 mmol/L (ref 98–111)
Creatinine, Ser: 1.22 mg/dL (ref 0.61–1.24)
GFR, Estimated: >60 mL/min (ref >60)
Glucose, Bld: 96 mg/dL (ref 70–99)
Potassium: 3.9 mmol/L (ref 3.5–5.1)
Sodium: 140 mmol/L (ref 135–145)

## 2023-01-20 LAB — BRAIN NATRIURETIC PEPTIDE: B Natriuretic Peptide: 158.6 pg/mL — ABNORMAL HIGH (ref 0.0–100.0)

## 2023-01-20 NOTE — Patient Instructions (Addendum)
EKG done today.  Labs done today. We will contact you only if your labs are abnormal.  No medication changes were made. Please continue all current medications as prescribed.  Thank you for allowing us to provide your heart failure care after your recent hospitalization. Please follow-up with General Cardiology.     

## 2023-01-20 NOTE — Progress Notes (Signed)
HEART & VASCULAR TRANSITION OF CARE CONSULT NOTE     Referring Physician: Dr. Shari Prows  Primary Care: Ignatius Specking, MD Primary Cardiologist: Dr. Royann Shivers    HPI: Referred to clinic by Dr. Shari Prows for heart failure consultation.   77 y/o AAM w/ h/o PAF, HTN, OSA and frequent PVCs, recently admitted 5/24 for acute systolic heart failure. Echo showed newly reduced LVEF, 30-35%, severely dilated LV, mild LVH, GIDD and mildly reduced RV, only trivial MR (last echo in 2023 showed normal EF 50-55%). Subsequent LHC showed mild nonobstructive CAD. He was diuresed w/ IV Lasix and placed on GDMT. Referred to TOC at d/c. Discharge wt 341 lb.   He presents today for f/u. Reports improvement in symptoms. Less SOB but c/w some dyspnea w/ moderate level activity. No CP. Checking wt daily at home and wt has been stable since discharge. Reports full med compliance. Tolerating well w/o side effects. BP well controlled 124/78. EKG shows SR w/ frequent PVCs in a pattern of bigeminy. He is asymptomatic w/o palpitations. Denies dizziness and fatigue. Reports full nightly compliance w/ his CPAP.     Cardiac Testing   Echo 2017 EF 55-60%, RV mod dilated w/ mod reduced SFx  Echo 2023 EF 50-55%, RV normal, Frequent PVCs noted during study   Echo 12/28/22: 1. Left ventricular ejection fraction, by estimation, is 30 to 35%. The  left ventricle has moderately decreased function. The left ventricle  demonstrates global hypokinesis. The left ventricular internal cavity size  was severely dilated. There is mild  concentric left ventricular hypertrophy. Left ventricular diastolic  parameters are consistent with Grade I diastolic dysfunction (impaired  relaxation). Elevated left atrial pressure.   2. Right ventricular systolic function is low normal. The right  ventricular size is normal.   3. Left atrial size was moderately dilated.   4. Right atrial size was mildly dilated.   5. The mitral valve is normal  in structure. Trivial mitral valve  regurgitation.   6. The aortic valve is tricuspid. Aortic valve regurgitation is not  visualized.   Right and left heart cath 12/31/22:   Prox RCA lesion is 30% stenosed.   Mid RCA lesion is 30% stenosed.   Ost LM to Dist LM lesion is 30% stenosed.   Mid LAD to Dist LAD lesion is 30% stenosed.   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  No focally obstructive lesions.  Normal right and left heart pressures.    Recommendations: Medical management of non-ischemic cardiomyopathy. His Eliquis can be restarted tomorrow. I would favor monitoring him today and repeating BMET tomorrow.     Review of Systems: [y] = yes, [ ]  = no   General: Weight gain [ ] ; Weight loss [ ] ; Anorexia [ ] ; Fatigue [ ] ; Fever [ ] ; Chills [ ] ; Weakness [ ]   Cardiac: Chest pain/pressure [ ] ; Resting SOB [ ] ; Exertional SOB [ Y]; Orthopnea [ ] ; Pedal Edema [ ] ; Palpitations [ ] ; Syncope [ ] ; Presyncope [ ] ; Paroxysmal nocturnal dyspnea[ ]   Pulmonary: Cough [ ] ; Wheezing[ ] ; Hemoptysis[ ] ; Sputum [ ] ; Snoring [ ]   GI: Vomiting[ ] ; Dysphagia[ ] ; Melena[ ] ; Hematochezia [ ] ; Heartburn[ ] ; Abdominal pain [ ] ; Constipation [ ] ; Diarrhea [ ] ; BRBPR [ ]   GU: Hematuria[ ] ; Dysuria [ ] ; Nocturia[ ]   Vascular: Pain in legs with walking [ ] ; Pain in feet with lying flat [ ] ; Non-healing sores [ ] ; Stroke [ ] ; TIA [ ] ; Slurred speech [ ] ;  Neuro: Headaches[ ] ; Vertigo[ ] ; Seizures[ ] ; Paresthesias[ ] ;Blurred vision [ ] ; Diplopia [ ] ; Vision changes [ ]   Ortho/Skin: Arthritis [ ] ; Joint pain [ ] ; Muscle pain [ ] ; Joint swelling [ ] ; Back Pain [ ] ; Rash [ ]   Psych: Depression[ ] ; Anxiety[ ]   Heme: Bleeding problems [ ] ; Clotting disorders [ ] ; Anemia [ ]   Endocrine: Diabetes [ ] ; Thyroid dysfunction[ ]    Past Medical History:  Diagnosis Date   Anxiety    Atrial fibrillation (HCC)    Hypertension    Shortness of breath    Sleep apnea     Current Outpatient Medications   Medication Sig Dispense Refill   apixaban (ELIQUIS) 5 MG TABS tablet Take 5 mg by mouth 2 (two) times daily.     ARIPiprazole (ABILIFY) 15 MG tablet Take 15 mg by mouth daily.     buPROPion (WELLBUTRIN SR) 200 MG 12 hr tablet Take 200 mg by mouth daily as needed (Depression).     diclofenac Sodium (VOLTAREN) 1 % GEL Apply 2 g topically as needed (pain).     empagliflozin (JARDIANCE) 10 MG TABS tablet Take 1 tablet (10 mg total) by mouth daily. 90 tablet 3   furosemide (LASIX) 40 MG tablet Take 40 mg by mouth daily.     latanoprost (XALATAN) 0.005 % ophthalmic solution 1 drop at bedtime.     losartan (COZAAR) 50 MG tablet Take 1 tablet (50 mg total) by mouth daily. 30 tablet 3   metoprolol succinate (TOPROL-XL) 25 MG 24 hr tablet Take 1 tablet (25 mg total) by mouth daily. 90 tablet 3   rosuvastatin (CRESTOR) 40 MG tablet Take 40 mg by mouth daily.     spironolactone (ALDACTONE) 25 MG tablet Take 1/2 tablet (12.5 mg total) by mouth daily. 90 tablet 3   No current facility-administered medications for this encounter.    No Known Allergies    Social History   Socioeconomic History   Marital status: Married    Spouse name: Darel Hong   Number of children: 2   Years of education: Not on file   Highest education level: High school graduate  Occupational History   Occupation: Retired  Tobacco Use   Smoking status: Former    Packs/day: 1.00    Years: 30.00    Additional pack years: 0.00    Total pack years: 30.00    Types: Cigarettes    Quit date: 08/23/1996    Years since quitting: 26.4   Smokeless tobacco: Never  Vaping Use   Vaping Use: Never used  Substance and Sexual Activity   Alcohol use: Yes    Comment: occasional   Drug use: No   Sexual activity: Yes  Other Topics Concern   Not on file  Social History Narrative   Not on file   Social Determinants of Health   Financial Resource Strain: Low Risk  (12/31/2022)   Overall Financial Resource Strain (CARDIA)    Difficulty  of Paying Living Expenses: Not very hard  Food Insecurity: No Food Insecurity (12/28/2022)   Hunger Vital Sign    Worried About Running Out of Food in the Last Year: Never true    Ran Out of Food in the Last Year: Never true  Transportation Needs: No Transportation Needs (12/31/2022)   PRAPARE - Administrator, Civil Service (Medical): No    Lack of Transportation (Non-Medical): No  Physical Activity: Not on file  Stress: Not on file  Social Connections: Not on  file  Intimate Partner Violence: Not At Risk (12/28/2022)   Humiliation, Afraid, Rape, and Kick questionnaire    Fear of Current or Ex-Partner: No    Emotionally Abused: No    Physically Abused: No    Sexually Abused: No      Family History  Problem Relation Age of Onset   Heart disease Mother    Cancer - Lung Father    Heart attack Father     Vitals:   01/20/23 0959  BP: 124/78  Pulse: (!) 57  SpO2: 97%  Weight: (!) 154.7 kg (341 lb)    PHYSICAL EXAM: General:  Well appearing. No respiratory difficulty HEENT: normal Neck: supple. no JVD. Carotids 2+ bilat; no bruits. No lymphadenopathy or thryomegaly appreciated. Cor: PMI nondisplaced. Regular rate & rhythm. No rubs, gallops or murmurs. Lungs: clear Abdomen: soft, nontender, nondistended. No hepatosplenomegaly. No bruits or masses. Good bowel sounds. Extremities: no cyanosis, clubbing, rash, edema Neuro: alert & oriented x 3, cranial nerves grossly intact. moves all 4 extremities w/o difficulty. Affect pleasant.  ECG: NSR w/ frequent PVCs in a pattern of bigeminy    ASSESSMENT & PLAN:  1. Chronic Systolic Heart Failure  - new diagnosis. NICM. Echo 5/24 LVEF, 30-35%, severely dilated LV, mild LVH, GIDD and mildly reduced RV, only trivial MR (last echo in 2023 showed normal EF 50-55%). Subsequent LHC showed mild nonobstructive CAD.  - Etiology uncertain. ? Possible PVC mediated CM given h/o frequent PVCs (seen on today's EKG, prior EKGs and noted on  prior echo).  - needs 2wk Zio (see below) - consider cMRI  - NYHA Class II. Euvolemic on exam  - Continue Jardiance 10 mg daily  - Continue Losartan 10 mg daily (we checked cost of Entresto, Copay will be too high) - Increase Spironolactone to 25 mg daily  - Continue Lasix 40 mg daily  - Continue care management per cardiology. If EF fails to improve after 3 months of GDMT/ suppression of PVCs, recommend referral to the [facility]   2. Frequent PVCs - ? If contributing to vs secondary to CM  - recommend 2 wk Zio to quantify burden and consideration of suppression w/ AAD therapy if significant burden  - continue strict compliance w/ CPAP  - check BMP today (recent Mg normal at 2.2)   3. OSA - reports full compliance w/ CPAP - followed by Dr. Tresa Endo   4. CAD - mild nonobstructive dz by cath 5/24 30% ost-dist LM 30% mid-dist LAD 30% prox RCA  - on statin, Crestor 40 mg  - no ASA w/ Eliquis use   5. Hypertension  - controlled on current regimen - GDMT per above - check BMP today     Referred to HFSW (PCP, Medications, Transportation, ETOH Abuse, Drug Abuse, Insurance, Financial ):  or No Refer to Pharmacy:  No Refer to Home Health:  No Refer to Advanced Heart Failure Clinic: No  Refer to General Cardiology: Yes (followed by Dr. Royann Shivers)   Follow up w/ Dr. Royann Shivers. Will need 2 wk Zio to quantify PVC burden. Continue care management per cardiology. If EF fails to improve after 3 months of GDMT/ suppression of PVCs, recommend referral to the [facility]

## 2023-01-21 ENCOUNTER — Telehealth (HOSPITAL_COMMUNITY): Payer: Self-pay

## 2023-01-21 NOTE — Telephone Encounter (Signed)
-----   Message from Allayne Butcher, [place] sent at 01/20/2023  4:38 PM EDT ----- Fluid marker slightly elevated. Increase Spironolactone to 25 mg daily.

## 2023-01-21 NOTE — Telephone Encounter (Signed)
Informed patient about labs and medication change.

## 2023-01-22 ENCOUNTER — Telehealth: Payer: Self-pay | Admitting: *Deleted

## 2023-01-22 DIAGNOSIS — I10 Essential (primary) hypertension: Secondary | ICD-10-CM | POA: Diagnosis not present

## 2023-01-22 NOTE — Telephone Encounter (Signed)
   [facility] Pre-operative Risk Assessment    Patient Name: Robert Warren  DOB: 10-10-1945 MRN: [id number]  HEARTCARE STAFF:  - IMPORTANT!!!!!! Under Visit Info/Reason for Call, type in Other and utilize the format Clearance MM/DD/YY or Clearance TBD. Do not use dashes or single digits. - Please review there is not already an duplicate clearance open for this procedure. - If request is for dental extraction, please clarify the # of teeth to be extracted. - If the patient is currently at the dentist's office, call Pre-Op Callback Staff (MA/nurse) to input urgent request.  - If the patient is not currently in the dentist office, please route to the Pre-Op pool.  Request for surgical clearance:  What type of surgery is being performed? Cystoscopy Left Retrograde Pyelogram with stent placement  When is this surgery scheduled? 02/18/23   What type of clearance is required (medical clearance vs. Pharmacy clearance to hold med vs. Both)? Medical  Are there any medications that need to be held prior to surgery and how long? Pt on eliquis, but per surgeons office, pt will not need to hold  Practice name and name of physician performing surgery? Dr Ronne Binning, [facility] [place]  What is the office phone number? [phone]   7.   What is the office fax number? [phone]  8.   Anesthesia type (None, local, MAC, general) ? General   Ladaisha Portillo L 01/22/2023, 3:47 PM  _________________________________________________________________   (provider comments below)

## 2023-01-22 NOTE — Telephone Encounter (Signed)
     Primary Cardiologist: Thurmon Fair, MD  Chart reviewed as part of pre-operative protocol coverage. Given past medical history and time since last visit, based on ACC/AHA guidelines, Robert Warren would be at acceptable risk for the planned procedure without further cardiovascular testing.   His RCRI is a class III risk, 6.6% risk of major cardiac event.  I will route this recommendation to the requesting party via Epic fax function and remove from pre-op pool.  Please call with questions.  Thomasene Ripple. Jocilynn Grade NP-C     01/22/2023, 4:11 PM [facility] [address] Office [phone] Fax [phone]

## 2023-01-26 NOTE — Progress Notes (Unsigned)
Cardiology Office Note:    Date:  01/28/2023   ID:  Robert Warren, DOB 1946/01/03, MRN [id number]  PCP:  Ignatius Specking, MD [facility] Cardiologist: Thurmon Fair, MD   Reason for visit:  Preop -- Cystoscopy Left Retrograde Pyelogram with stent placement on 02/18/23 office fax number: [phone]            History of Present Illness:    Robert Warren is a 77 y.o. male with a hx of PAF, HTN, OSA and frequent PVCs.  He was seen by Dr. Royann Shivers in 09/2022 for dyspnea.  Noted to have freq PVCs.  His verapamil was discontinued and he was started on metoprolol 50 mg daily.  Was recommended to overall be on a lower dose given a history of junctional bradycardia on higher doses of AV nodal blocking agents.   He was recently admitted 5/24 for acute systolic heart failure. Echo showed newly reduced LVEF, 30-35%, severely dilated LV, mild LVH, GIDD and mildly reduced RV, only trivial MR (last echo in 2023 showed normal EF 50-55%). Subsequent LHC showed mild nonobstructive CAD. Discharge wt after diuresis: 341 lb.   He had TOC at HF clinic on 01/20/23.  He was less SOB with dyspnea with moderate activity.  Weight stable.  EKG with freq PVCs.  No palpitations.  Cleda Daub increased to 25mg  daily.  "If EF fails to improve after 3 months of GDMT/ suppression of PVCs, recommend referral to the [facility]."   Today, patient is doing well.  He states he had a shot to his knees yesterday and he plans to restart a walking program.  He states he is doing well on his current medications.  He denies lightheadedness.  Blood pressure at home 120s over 60s to 70s.  From a heart failure standpoint, he states he gets shortness of breath if he does strenuous work or if he is in a hurry.  He denies shortness of breath walking to the office today.  He denies needing to rest after flight of stairs.  States his weight has been stable around 339 to 340 pounds.  He denies lower extremity edema, PND and orthopnea.  His heart  rate log shows heart rates in the low 50s.  He denies palpitations.  No syncope.    Past Medical History:  Diagnosis Date   Anxiety    Atrial fibrillation (HCC)    Hypertension    Shortness of breath    Sleep apnea     Past Surgical History:  Procedure Laterality Date   Anal cyst Removal     COLONOSCOPY N/A 06/20/2014   Procedure: COLONOSCOPY;  Surgeon: Malissa Hippo, MD;  [facility];  Service: Endoscopy;  Laterality: N/A;  1030   COLONOSCOPY WITH PROPOFOL N/A 03/14/2021   Procedure: COLONOSCOPY WITH PROPOFOL;  Surgeon: Dolores Frame, MD;  Location: [facility];  Service: Gastroenterology;  Laterality: N/A;  10:50   CYSTOSCOPY     EXTRACORPOREAL SHOCK WAVE LITHOTRIPSY Left 08/04/2022   Procedure: EXTRACORPOREAL SHOCK WAVE LITHOTRIPSY (ESWL);  Surgeon: Malen Gauze, MD;  Location: [facility];  Service: Urology;  Laterality: Left;   GREEN LIGHT LASER TURP (TRANSURETHRAL RESECTION OF PROSTATE     POLYPECTOMY  03/14/2021   Procedure: POLYPECTOMY;  Surgeon: Dolores Frame, MD;  Location: [facility];  Service: Gastroenterology;;   RIGHT/LEFT HEART CATH AND CORONARY ANGIOGRAPHY N/A 12/31/2022   Procedure: RIGHT/LEFT HEART CATH AND CORONARY ANGIOGRAPHY;  Surgeon: Kathleene Hazel, MD;  Location: [facility]  INVASIVE CV LAB;  Service: Cardiovascular;  Laterality: N/A;    Current Medications: Current Meds  Medication Sig   apixaban (ELIQUIS) 5 MG TABS tablet Take 5 mg by mouth 2 (two) times daily.   ARIPiprazole (ABILIFY) 15 MG tablet Take 15 mg by mouth daily.   buPROPion (WELLBUTRIN SR) 200 MG 12 hr tablet Take 200 mg by mouth daily as needed (Depression).   diclofenac Sodium (VOLTAREN) 1 % GEL Apply 2 g topically as needed (pain).   empagliflozin (JARDIANCE) 10 MG TABS tablet Take 1 tablet (10 mg total) by mouth daily.   furosemide (LASIX) 40 MG tablet Take 40 mg by mouth daily.   latanoprost (XALATAN) 0.005 % ophthalmic solution 1 drop at  bedtime.   losartan (COZAAR) 50 MG tablet Take 1 tablet (50 mg total) by mouth 2 (two) times daily.   metoprolol succinate (TOPROL-XL) 25 MG 24 hr tablet Take 1 tablet (25 mg total) by mouth daily.   rosuvastatin (CRESTOR) 40 MG tablet Take 40 mg by mouth daily.   spironolactone (ALDACTONE) 25 MG tablet Take 1/2 tablet (12.5 mg total) by mouth daily. (Patient taking differently: Take 25 mg by mouth daily.)   [DISCONTINUED] losartan (COZAAR) 50 MG tablet Take 1 tablet (50 mg total) by mouth daily.     Allergies:   Patient has no known allergies.   Social History   Socioeconomic History   Marital status: Married    Spouse name: Darel Hong   Number of children: 2   Years of education: Not on file   Highest education level: High school graduate  Occupational History   Occupation: Retired  Tobacco Use   Smoking status: Former    Packs/day: 1.00    Years: 30.00    Additional pack years: 0.00    Total pack years: 30.00    Types: Cigarettes    Quit date: 08/23/1996    Years since quitting: 26.4   Smokeless tobacco: Never  Vaping Use   Vaping Use: Never used  Substance and Sexual Activity   Alcohol use: Yes    Comment: occasional   Drug use: No   Sexual activity: Yes  Other Topics Concern   Not on file  Social History Narrative   Not on file   Social Determinants of Health   Financial Resource Strain: Low Risk  (12/31/2022)   Overall Financial Resource Strain (CARDIA)    Difficulty of Paying Living Expenses: Not very hard  Food Insecurity: No Food Insecurity (12/28/2022)   Hunger Vital Sign    Worried About Running Out of Food in the Last Year: Never true    Ran Out of Food in the Last Year: Never true  Transportation Needs: No Transportation Needs (12/31/2022)   PRAPARE - Administrator, Civil Service (Medical): No    Lack of Transportation (Non-Medical): No  Physical Activity: Not on file  Stress: Not on file  Social Connections: Not on file     Family  History: The patient's family history includes Cancer - Lung in his father; Heart attack in his father; Heart disease in his mother.  ROS:   Please see the history of present illness.     EKGs/Labs/Other Studies Reviewed:    Recent Labs: 12/29/2022: ALT 16 12/30/2022: Magnesium 2.2 01/01/2023: Hemoglobin 14.5; Platelets 207 01/20/2023: B Natriuretic Peptide 158.6; BUN 11; Creatinine, Ser 1.22; Potassium 3.9; Sodium 140   Recent Lipid Panel Lab Results  Component Value Date/Time   CHOL 132 12/31/2022 12:52 AM  TRIG 67 12/31/2022 12:52 AM   HDL 24 (L) 12/31/2022 12:52 AM   LDLCALC 95 12/31/2022 12:52 AM    Physical Exam:    VS:  BP 128/84   Pulse (!) 43   Ht 6\' 4"  (1.93 m)   Wt (!) 342 lb (155.1 kg)   SpO2 94%   BMI 41.63 kg/m    No data found.   Wt Readings from Last 3 Encounters:  01/28/23 (!) 342 lb (155.1 kg)  01/20/23 (!) 341 lb (154.7 kg)  01/05/23 (!) 340 lb 9.6 oz (154.5 kg)     GEN:  Well nourished, well developed in no acute distress HEENT: Normal NECK: No JVD; No carotid bruits CARDIAC: RRR, no murmurs, rubs, gallops RESPIRATORY:  Clear to auscultation without rales, wheezing or rhonchi  ABDOMEN: Soft, non-tender, non-distended MUSCULOSKELETAL: No edema SKIN: Warm and dry NEUROLOGIC:  Alert and oriented PSYCHIATRIC:  Normal affect    ASSESSMENT AND PLAN   Preop clearance   :[id number]} Mr. Train's perioperative risk of a major cardiac event is 0.9% according to the Revised Cardiac Risk Index (RCRI).  Therefore, he is at low risk for perioperative complications.   His functional capacity is excellent at 6.61 METs according to the Duke Activity Status Index (DASI). Recommendations: According to ACC/AHA guidelines, no further cardiovascular testing needed.  The patient may proceed to surgery at acceptable risk.   Antiplatelet and/or Anticoagulation Recommendations: Eliquis (Apixaban) can be held for 2 days prior to surgery.  Please resume post op when felt  to be safe.    Chronic systolic CHF, euvolemic -NICM. Echo 5/24 LVEF, 30-35%, severely dilated LV, mild LVH, GIDD and mildly reduced RV, only trivial MR (last echo in 2023 showed normal EF 50-55%). Subsequent LHC showed mild nonobstructive CAD.  -With possible etiology from freq PVCs and history of PAF, will order Zio monitor to monitor arrhythmia burden.  Dr. Tresa Endo treating OSA. -GDMT prev limited by soft BP -but seems to be tolerated well currently. Sherryll Burger co-pay cost prohibitive.  Continued on losartan -will increase losartan to 50 mg twice daily. -Continue Jardiance 10 mg daily.  He did say his co-pay was $355 for 30 tablets.  He was given a co-pay card today.  He will also check you [facility] coverage -he sees his [place] doctor next week.. -Continue Toprol XL 25 mg.  With heart rate in 40s, unable to titrate. -Continue spironolactone 25 mg daily.   -Continue Lasix 40 mg daily. -Check BMET in 2 weeks (when patient comes in to see Dr. Tresa Endo for sleep apnea management). -Check 2D echo in 3 months to see if EF improved with goal-directed medical therapy.  PVCs, overall asymptomatic -Order 2 weeks Zio patch to monitor PVC burden.  PAF -Continue Toprol and Eliquis.  No bleeding issues.  Coronary artery disease, no angina -LHC 12/2022: mild CAD  -Continue statin therapy.  Not on aspirin given need for anticoagulation. -Would benefit from weight loss.  Disposition - Follow-up in 3 months.   Medication Adjustments/Labs and Tests Ordered: Current medicines are reviewed at length with the patient today.  Concerns regarding medicines are outlined above.  Orders Placed This Encounter  Procedures   Basic metabolic panel   LONG TERM MONITOR (3-14 DAYS)   ECHOCARDIOGRAM COMPLETE   Meds ordered this encounter  Medications   losartan (COZAAR) 50 MG tablet    Sig: Take 1 tablet (50 mg total) by mouth 2 (two) times daily.    Dispense:  180 tablet    Refill:  2    Patient Instructions  Medication  Instructions:  Increase Losartan 50 mg ( Take 1 Tablet Twice Daily). *If you need a refill on your cardiac medications before your next appointment, please call your pharmacy*   Lab Work: BMET. 2 weeks If you have labs (blood work) drawn today and your tests are completely normal, you will receive your results only by: MyChart Message (if you have MyChart) OR A paper copy in the mail If you have any lab test that is abnormal or we need to change your treatment, we will call you to review the results.   Testing/Procedures: [address], [address]. ( 3 months) Your physician has requested that you have an echocardiogram. Echocardiography is a painless test that uses sound waves to create images of your heart. It provides your doctor with information about the size and shape of your heart and how well your heart's chambers and valves are working. This procedure takes approximately one hour. There are no restrictions for this procedure. Please do NOT wear cologne, perfume, aftershave, or lotions (deodorant is allowed). Please arrive 15 minutes prior to your appointment time.   ZIO XT- Long Term Monitor Instructions  Your physician has requested you wear a ZIO patch monitor for 14 days.  This is a single patch monitor. [employer] supplies one patch monitor per enrollment. Additional stickers are not available. Please do not apply patch if you will be having a Nuclear Stress Test,  Echocardiogram, Cardiac CT, MRI, or Chest Xray during the period you would be wearing the  monitor. The patch cannot be worn during these tests. You cannot remove and re-apply the  ZIO XT patch monitor.  Your ZIO patch monitor will be mailed 3 day USPS to your address on file. It may take 3-5 days  to receive your monitor after you have been enrolled.  Once you have received your monitor, please review the enclosed instructions. Your monitor  has already been registered assigning a specific monitor serial #  to you.  Billing and Patient Assistance Program Information  We have supplied [employer] with any of your insurance information on file for billing purposes. [employer] offers a sliding scale Patient Assistance Program for patients that do not have  insurance, or whose insurance does not completely cover the cost of the ZIO monitor.  You must apply for the Patient Assistance Program to qualify for this discounted rate.  To apply, please call [employer] at [phone], select option 4, select option 2, ask to apply for  Patient Assistance Program. Meredeth Ide will ask your household income, and how many people  are in your household. They will quote your out-of-pocket cost based on that information.  [employer] will also be able to set up a 59-month, interest-free payment plan if needed.  Applying the monitor   Shave hair from upper left chest.  Hold abrader disc by orange tab. Rub abrader in 40 strokes over the upper left chest as  indicated in your monitor instructions.  Clean area with 4 enclosed alcohol pads. Let dry.  Apply patch as indicated in monitor instructions. Patch will be placed under collarbone on left  side of chest with arrow pointing upward.  Rub patch adhesive wings for 2 minutes. Remove white label marked "1". Remove the white  label marked "2". Rub patch adhesive wings for 2 additional minutes.  While looking in a mirror, press and release button in center of patch. A small green light will  flash 3-4 times. This  will be your only indicator that the monitor has been turned on.  Do not shower for the first 24 hours. You may shower after the first 24 hours.  Press the button if you feel a symptom. You will hear a small click. Record Date, Time and  Symptom in the Patient Logbook.  When you are ready to remove the patch, follow instructions on the last 2 pages of Patient  Logbook. Stick patch monitor onto the last page of Patient Logbook.  Place Patient Logbook in the blue and white  box. Use locking tab on box and tape box closed  securely. The blue and white box has prepaid postage on it. Please place it in the mailbox as  soon as possible. Your physician should have your test results approximately 7 days after the  monitor has been mailed back to [facility].  Call [facility] Customer Care at [phone] if you have questions regarding  your ZIO XT patch monitor. Call them immediately if you see an orange light blinking on your  monitor.  If your monitor falls off in less than 4 days, contact our Monitor department at [phone].  If your monitor becomes loose or falls off after 4 days call [employer] at [phone] for  suggestions on securing your monitor  Follow-Up: At [facility], you and your health needs are our priority.  As part of our continuing mission to provide you with exceptional heart care, we have created designated Provider Care Teams.  These Care Teams include your primary Cardiologist (physician) and Advanced Practice Providers (APPs -  Physician Assistants and Nurse Practitioners) who all work together to provide you with the care you need, when you need it.  We recommend signing up for the patient portal called "MyChart".  Sign up information is provided on this After Visit Summary.  MyChart is used to connect with patients for Virtual Visits (Telemedicine).  Patients are able to view lab/test results, encounter notes, upcoming appointments, etc.  Non-urgent messages can be sent to your provider as well.   To learn more about what you can do with MyChart, go to [email or web address].    Your next appointment:   3 month(s)  Provider:   Juanda Crumble, PA-C    or, Thurmon Fair, MD    Signed, Cannon Kettle, PA-C  01/28/2023 12:21 PM    [facility]

## 2023-01-27 ENCOUNTER — Ambulatory Visit: Payer: Medicare Other | Admitting: Physician Assistant

## 2023-01-27 DIAGNOSIS — M170 Bilateral primary osteoarthritis of knee: Secondary | ICD-10-CM | POA: Diagnosis not present

## 2023-01-27 DIAGNOSIS — M25561 Pain in right knee: Secondary | ICD-10-CM | POA: Diagnosis not present

## 2023-01-27 DIAGNOSIS — R262 Difficulty in walking, not elsewhere classified: Secondary | ICD-10-CM | POA: Diagnosis not present

## 2023-01-27 DIAGNOSIS — M25562 Pain in left knee: Secondary | ICD-10-CM | POA: Diagnosis not present

## 2023-01-28 ENCOUNTER — Ambulatory Visit: Payer: Medicare Other | Attending: Physician Assistant

## 2023-01-28 ENCOUNTER — Ambulatory Visit: Payer: Medicare Other | Attending: Physician Assistant | Admitting: Physician Assistant

## 2023-01-28 VITALS — BP 128/84 | HR 43 | Ht 76.0 in | Wt 342.0 lb

## 2023-01-28 DIAGNOSIS — I5022 Chronic systolic (congestive) heart failure: Secondary | ICD-10-CM

## 2023-01-28 DIAGNOSIS — I2510 Atherosclerotic heart disease of native coronary artery without angina pectoris: Secondary | ICD-10-CM | POA: Diagnosis not present

## 2023-01-28 DIAGNOSIS — I480 Paroxysmal atrial fibrillation: Secondary | ICD-10-CM | POA: Diagnosis not present

## 2023-01-28 DIAGNOSIS — Z01818 Encounter for other preprocedural examination: Secondary | ICD-10-CM | POA: Insufficient documentation

## 2023-01-28 DIAGNOSIS — I493 Ventricular premature depolarization: Secondary | ICD-10-CM

## 2023-01-28 MED ORDER — LOSARTAN POTASSIUM 50 MG PO TABS: 50.0000 mg | ORAL_TABLET | Freq: Two times a day (BID) | ORAL | 2 refills | Status: DC

## 2023-01-28 NOTE — Patient Instructions (Addendum)
Medication Instructions:  Increase Losartan 50 mg ( Take 1 Tablet Twice Daily). *If you need a refill on your cardiac medications before your next appointment, please call your pharmacy*   Lab Work: BMET. 2 weeks If you have labs (blood work) drawn today and your tests are completely normal, you will receive your results only by: MyChart Message (if you have MyChart) OR A paper copy in the mail If you have any lab test that is abnormal or we need to change your treatment, we will call you to review the results.   Testing/Procedures: [address]. ( 3 months) Your physician has requested that you have an echocardiogram. Echocardiography is a painless test that uses sound waves to create images of your heart. It provides your doctor with information about the size and shape of your heart and how well your heart's chambers and valves are working. This procedure takes approximately one hour. There are no restrictions for this procedure. Please do NOT wear cologne, perfume, aftershave, or lotions (deodorant is allowed). Please arrive 15 minutes prior to your appointment time.   ZIO XT- Long Term Monitor Instructions  Your physician has requested you wear a ZIO patch monitor for 14 days.  This is a single patch monitor. [employer] supplies one patch monitor per enrollment. Additional stickers are not available. Please do not apply patch if you will be having a Nuclear Stress Test,  Echocardiogram, Cardiac CT, MRI, or Chest Xray during the period you would be wearing the  monitor. The patch cannot be worn during these tests. You cannot remove and re-apply the  ZIO XT patch monitor.  Your ZIO patch monitor will be mailed 3 day USPS to your address on file. It may take 3-5 days  to receive your monitor after you have been enrolled.  Once you have received your monitor, please review the enclosed instructions. Your monitor  has already been registered assigning a specific  monitor serial # to you.  Billing and Patient Assistance Program Information  We have supplied [employer] with any of your insurance information on file for billing purposes. [employer] offers a sliding scale Patient Assistance Program for patients that do not have  insurance, or whose insurance does not completely cover the cost of the ZIO monitor.  You must apply for the Patient Assistance Program to qualify for this discounted rate.  To apply, please call [employer] at [phone], select option 4, select option 2, ask to apply for  Patient Assistance Program. Meredeth Ide will ask your household income, and how many people  are in your household. They will quote your out-of-pocket cost based on that information.  [employer] will also be able to set up a 23-month, interest-free payment plan if needed.  Applying the monitor   Shave hair from upper left chest.  Hold abrader disc by orange tab. Rub abrader in 40 strokes over the upper left chest as  indicated in your monitor instructions.  Clean area with 4 enclosed alcohol pads. Let dry.  Apply patch as indicated in monitor instructions. Patch will be placed under collarbone on left  side of chest with arrow pointing upward.  Rub patch adhesive wings for 2 minutes. Remove white label marked "1". Remove the white  label marked "2". Rub patch adhesive wings for 2 additional minutes.  While looking in a mirror, press and release button in center of patch. A small green light will  flash 3-4 times. This will be your only indicator that the monitor  has been turned on.  Do not shower for the first 24 hours. You may shower after the first 24 hours.  Press the button if you feel a symptom. You will hear a small click. Record Date, Time and  Symptom in the Patient Logbook.  When you are ready to remove the patch, follow instructions on the last 2 pages of Patient  Logbook. Stick patch monitor onto the last page of Patient Logbook.  Place Patient Logbook in the  blue and white box. Use locking tab on box and tape box closed  securely. The blue and white box has prepaid postage on it. Please place it in the mailbox as  soon as possible. Your physician should have your test results approximately 7 days after the  monitor has been mailed back to [facility].  Call [facility] Customer Care at [phone] if you have questions regarding  your ZIO XT patch monitor. Call them immediately if you see an orange light blinking on your  monitor.  If your monitor falls off in less than 4 days, contact our Monitor department at [phone].  If your monitor becomes loose or falls off after 4 days call [employer] at [phone] for  suggestions on securing your monitor  Follow-Up: At [facility], you and your health needs are our priority.  As part of our continuing mission to provide you with exceptional heart care, we have created designated Provider Care Teams.  These Care Teams include your primary Cardiologist (physician) and Advanced Practice Providers (APPs -  Physician Assistants and Nurse Practitioners) who all work together to provide you with the care you need, when you need it.  We recommend signing up for the patient portal called "MyChart".  Sign up information is provided on this After Visit Summary.  MyChart is used to connect with patients for Virtual Visits (Telemedicine).  Patients are able to view lab/test results, encounter notes, upcoming appointments, etc.  Non-urgent messages can be sent to your provider as well.   To learn more about what you can do with MyChart, go to [email or web address].    Your next appointment:   3 month(s)  Provider:   Juanda Crumble, PA-C    or, Thurmon Fair, MD

## 2023-01-28 NOTE — Progress Notes (Unsigned)
Enrolled patient for a 14 day Zio XT monitor to be mailed to patients home.  ° °Croitoru to read °

## 2023-01-31 DIAGNOSIS — I5022 Chronic systolic (congestive) heart failure: Secondary | ICD-10-CM | POA: Diagnosis not present

## 2023-01-31 DIAGNOSIS — I480 Paroxysmal atrial fibrillation: Secondary | ICD-10-CM | POA: Diagnosis not present

## 2023-01-31 DIAGNOSIS — I2510 Atherosclerotic heart disease of native coronary artery without angina pectoris: Secondary | ICD-10-CM | POA: Diagnosis not present

## 2023-01-31 DIAGNOSIS — I493 Ventricular premature depolarization: Secondary | ICD-10-CM | POA: Diagnosis not present

## 2023-02-11 DIAGNOSIS — Z6839 Body mass index (BMI) 39.0-39.9, adult: Secondary | ICD-10-CM | POA: Diagnosis not present

## 2023-02-11 DIAGNOSIS — I5022 Chronic systolic (congestive) heart failure: Secondary | ICD-10-CM | POA: Diagnosis not present

## 2023-02-11 DIAGNOSIS — I10 Essential (primary) hypertension: Secondary | ICD-10-CM | POA: Diagnosis not present

## 2023-02-11 DIAGNOSIS — F319 Bipolar disorder, unspecified: Secondary | ICD-10-CM | POA: Diagnosis not present

## 2023-02-11 DIAGNOSIS — Z299 Encounter for prophylactic measures, unspecified: Secondary | ICD-10-CM | POA: Diagnosis not present

## 2023-02-12 ENCOUNTER — Telehealth: Payer: Self-pay | Admitting: Cardiovascular Disease

## 2023-02-12 LAB — BASIC METABOLIC PANEL: EGFR: 62

## 2023-02-12 NOTE — Telephone Encounter (Signed)
*  STAT* If patient is at the pharmacy, call can be transferred to refill team.   1. Which medications need to be refilled? (please list name of each medication and dose if known)   furosemide (LASIX) 40 MG tablet   2. Which pharmacy/location (including street and city if local pharmacy) is medication to be sent to?  [facility] [address] [place] - [address]   3. Do they need a 30 day or 90 day supply?   90 day  Patient stated he is completely out of this medication.

## 2023-02-15 ENCOUNTER — Encounter (HOSPITAL_COMMUNITY)
Admission: RE | Admit: 2023-02-15 | Discharge: 2023-02-15 | Disposition: A | Discharge status: Home / Self Care | Payer: Medicare Other | Source: Ambulatory Visit | Attending: Urology | Admitting: Urology

## 2023-02-16 ENCOUNTER — Telehealth: Payer: Self-pay

## 2023-02-16 NOTE — Telephone Encounter (Signed)
Patient called to see if he needed to hold his Eliquis prior to surgery.  I informed him we did have clearance for him to hold x2 days prior to surgery on 06/27.  Patient confirmed he did not take his blood thinner today and will proceed as scheduled for 06/27.

## 2023-02-17 ENCOUNTER — Ambulatory Visit: Payer: Medicare Other | Attending: Cardiovascular Disease | Admitting: Cardiovascular Disease

## 2023-02-17 ENCOUNTER — Encounter: Payer: Self-pay | Admitting: Cardiovascular Disease

## 2023-02-17 DIAGNOSIS — Z7901 Long term (current) use of anticoagulants: Secondary | ICD-10-CM | POA: Insufficient documentation

## 2023-02-17 DIAGNOSIS — I10 Essential (primary) hypertension: Secondary | ICD-10-CM | POA: Diagnosis not present

## 2023-02-17 DIAGNOSIS — G4731 Primary central sleep apnea: Secondary | ICD-10-CM | POA: Diagnosis not present

## 2023-02-17 DIAGNOSIS — I5033 Acute on chronic diastolic (congestive) heart failure: Secondary | ICD-10-CM

## 2023-02-17 DIAGNOSIS — I480 Paroxysmal atrial fibrillation: Secondary | ICD-10-CM | POA: Diagnosis not present

## 2023-02-17 DIAGNOSIS — I428 Other cardiomyopathies: Secondary | ICD-10-CM | POA: Insufficient documentation

## 2023-02-17 DIAGNOSIS — I4891 Unspecified atrial fibrillation: Secondary | ICD-10-CM

## 2023-02-17 DIAGNOSIS — R001 Bradycardia, unspecified: Secondary | ICD-10-CM

## 2023-02-17 DIAGNOSIS — I498 Other specified cardiac arrhythmias: Secondary | ICD-10-CM

## 2023-02-17 MED ORDER — FUROSEMIDE 40 MG PO TABS: 40.0000 mg | ORAL_TABLET | Freq: Every day | ORAL | 3 refills | Status: DC

## 2023-02-17 NOTE — Patient Instructions (Addendum)
Medication Instructions:  Cut Jardiance 25mg  tablets in half   *If you need a refill on your cardiac medications before your next appointment, please call your pharmacy*   Follow-Up: At [facility], you and your health needs are our priority.  As part of our continuing mission to provide you with exceptional heart care, we have created designated Provider Care Teams.  These Care Teams include your primary Cardiologist (physician) and Advanced Practice Providers (APPs -  Physician Assistants and Nurse Practitioners) who all work together to provide you with the care you need, when you need it.  We recommend signing up for the patient portal called "MyChart".  Sign up information is provided on this After Visit Summary.  MyChart is used to connect with patients for Virtual Visits (Telemedicine).  Patients are able to view lab/test results, encounter notes, upcoming appointments, etc.  Non-urgent messages can be sent to your provider as well.   To learn more about what you can do with MyChart, go to [email or web address].    Your next appointment:    October/November with Dr. Tresa Endo -- sleep clinic

## 2023-02-17 NOTE — Telephone Encounter (Signed)
Pt's medication was sent to pt's pharmacy as requested. Confirmation received.  °

## 2023-02-18 ENCOUNTER — Ambulatory Visit (HOSPITAL_BASED_OUTPATIENT_CLINIC_OR_DEPARTMENT_OTHER): Payer: Medicare Other | Admitting: Certified Registered"

## 2023-02-18 ENCOUNTER — Ambulatory Visit (HOSPITAL_COMMUNITY): Payer: Medicare Other | Admitting: Certified Registered"

## 2023-02-18 ENCOUNTER — Encounter (HOSPITAL_COMMUNITY)
Admission: RE | Disposition: A | Discharge status: Home / Self Care | Payer: Self-pay | Source: Home / Self Care | Attending: Urology

## 2023-02-18 ENCOUNTER — Encounter: Payer: Self-pay | Admitting: Cardiovascular Disease

## 2023-02-18 ENCOUNTER — Ambulatory Visit (HOSPITAL_COMMUNITY)
Admission: RE | Admit: 2023-02-18 | Discharge: 2023-02-18 | Disposition: A | Discharge status: Home / Self Care | Payer: Medicare Other | Attending: Urology | Admitting: Urology

## 2023-02-18 ENCOUNTER — Ambulatory Visit (HOSPITAL_COMMUNITY): Payer: Medicare Other

## 2023-02-18 DIAGNOSIS — N201 Calculus of ureter: Secondary | ICD-10-CM

## 2023-02-18 DIAGNOSIS — Z6841 Body Mass Index (BMI) 40.0 and over, adult: Secondary | ICD-10-CM | POA: Diagnosis not present

## 2023-02-18 DIAGNOSIS — I130 Hypertensive heart and chronic kidney disease with heart failure and stage 1 through stage 4 chronic kidney disease, or unspecified chronic kidney disease: Secondary | ICD-10-CM | POA: Diagnosis not present

## 2023-02-18 DIAGNOSIS — F419 Anxiety disorder, unspecified: Secondary | ICD-10-CM | POA: Diagnosis not present

## 2023-02-18 DIAGNOSIS — Z87891 Personal history of nicotine dependence: Secondary | ICD-10-CM | POA: Diagnosis not present

## 2023-02-18 DIAGNOSIS — G4730 Sleep apnea, unspecified: Secondary | ICD-10-CM | POA: Insufficient documentation

## 2023-02-18 DIAGNOSIS — I10 Essential (primary) hypertension: Secondary | ICD-10-CM | POA: Diagnosis not present

## 2023-02-18 DIAGNOSIS — I110 Hypertensive heart disease with heart failure: Secondary | ICD-10-CM | POA: Diagnosis not present

## 2023-02-18 DIAGNOSIS — I509 Heart failure, unspecified: Secondary | ICD-10-CM | POA: Diagnosis not present

## 2023-02-18 DIAGNOSIS — I4891 Unspecified atrial fibrillation: Secondary | ICD-10-CM | POA: Diagnosis not present

## 2023-02-18 DIAGNOSIS — D631 Anemia in chronic kidney disease: Secondary | ICD-10-CM

## 2023-02-18 DIAGNOSIS — N189 Chronic kidney disease, unspecified: Secondary | ICD-10-CM

## 2023-02-18 DIAGNOSIS — I5022 Chronic systolic (congestive) heart failure: Secondary | ICD-10-CM | POA: Diagnosis not present

## 2023-02-18 DIAGNOSIS — I480 Paroxysmal atrial fibrillation: Secondary | ICD-10-CM | POA: Diagnosis not present

## 2023-02-18 HISTORY — PX: CYSTOSCOPY WITH RETROGRADE PYELOGRAM, URETEROSCOPY AND STENT PLACEMENT: SHX5789

## 2023-02-18 HISTORY — PX: HOLMIUM LASER APPLICATION: SHX5852

## 2023-02-18 LAB — GLUCOSE, CAPILLARY: Glucose-Capillary: 85 mg/dL (ref 70–99)

## 2023-02-18 SURGERY — CYSTOURETEROSCOPY, WITH RETROGRADE PYELOGRAM AND STENT INSERTION
Anesthesia: General | Site: Ureter | Laterality: Left

## 2023-02-18 MED ORDER — DEXMEDETOMIDINE HCL IN NACL 80 MCG/20ML IV SOLN
INTRAVENOUS | Status: DC | PRN
  Administered 2023-02-18: 4 ug via INTRAVENOUS

## 2023-02-18 MED ORDER — CEFAZOLIN IN SODIUM CHLORIDE 3-0.9 GM/100ML-% IV SOLN
3.0000 g | INTRAVENOUS | Status: AC
  Administered 2023-02-18: 3 g via INTRAVENOUS

## 2023-02-18 MED ORDER — FENTANYL CITRATE (PF) 100 MCG/2ML IJ SOLN
INTRAMUSCULAR | Status: AC
  Filled 2023-02-18: qty 2

## 2023-02-18 MED ORDER — GLYCOPYRROLATE PF 0.2 MG/ML IJ SOSY
PREFILLED_SYRINGE | INTRAMUSCULAR | Status: DC | PRN
  Administered 2023-02-18 (×2): .1 mg via INTRAVENOUS

## 2023-02-18 MED ORDER — GLYCOPYRROLATE PF 0.2 MG/ML IJ SOSY
PREFILLED_SYRINGE | INTRAMUSCULAR | Status: AC
  Filled 2023-02-18: qty 1

## 2023-02-18 MED ORDER — ROCURONIUM BROMIDE 10 MG/ML (PF) SYRINGE
PREFILLED_SYRINGE | INTRAVENOUS | Status: AC
  Filled 2023-02-18: qty 10

## 2023-02-18 MED ORDER — LACTATED RINGERS IV SOLN: INTRAVENOUS | Status: DC

## 2023-02-18 MED ORDER — DIATRIZOATE MEGLUMINE 30 % UR SOLN
URETHRAL | Status: AC
  Filled 2023-02-18: qty 100

## 2023-02-18 MED ORDER — SUGAMMADEX SODIUM 200 MG/2ML IV SOLN
INTRAVENOUS | Status: DC | PRN
  Administered 2023-02-18: 300 mg via INTRAVENOUS

## 2023-02-18 MED ORDER — ORAL CARE MOUTH RINSE: 15.0000 mL | Freq: Once | OROMUCOSAL | Status: AC

## 2023-02-18 MED ORDER — LIDOCAINE 2% (20 MG/ML) 5 ML SYRINGE
INTRAMUSCULAR | Status: DC | PRN
  Administered 2023-02-18: 100 mg via INTRAVENOUS

## 2023-02-18 MED ORDER — PHENYLEPHRINE HCL-NACL 20-0.9 MG/250ML-% IV SOLN
INTRAVENOUS | Status: DC | PRN
  Administered 2023-02-18: 30 ug/min via INTRAVENOUS

## 2023-02-18 MED ORDER — WATER FOR IRRIGATION, STERILE IR SOLN
Status: DC | PRN
  Administered 2023-02-18: 1000 mL

## 2023-02-18 MED ORDER — FENTANYL CITRATE (PF) 250 MCG/5ML IJ SOLN
INTRAMUSCULAR | Status: DC | PRN
  Administered 2023-02-18 (×2): 50 ug via INTRAVENOUS

## 2023-02-18 MED ORDER — ROCURONIUM BROMIDE 10 MG/ML (PF) SYRINGE
PREFILLED_SYRINGE | INTRAVENOUS | Status: DC | PRN
  Administered 2023-02-18: 50 mg via INTRAVENOUS

## 2023-02-18 MED ORDER — PHENYLEPHRINE 80 MCG/ML (10ML) SYRINGE FOR IV PUSH (FOR BLOOD PRESSURE SUPPORT)
PREFILLED_SYRINGE | INTRAVENOUS | Status: AC
  Filled 2023-02-18: qty 10

## 2023-02-18 MED ORDER — LIDOCAINE HCL (PF) 2 % IJ SOLN
INTRAMUSCULAR | Status: AC
  Filled 2023-02-18: qty 5

## 2023-02-18 MED ORDER — HYDROCODONE-ACETAMINOPHEN 5-325 MG PO TABS: 1.0000 | ORAL_TABLET | Freq: Four times a day (QID) | ORAL | 0 refills | Status: DC | PRN

## 2023-02-18 MED ORDER — EPHEDRINE SULFATE-NACL 50-0.9 MG/10ML-% IV SOSY
PREFILLED_SYRINGE | INTRAVENOUS | Status: DC | PRN
  Administered 2023-02-18 (×2): 5 mg via INTRAVENOUS

## 2023-02-18 MED ORDER — PROPOFOL 10 MG/ML IV BOLUS
INTRAVENOUS | Status: DC | PRN
  Administered 2023-02-18: 120 mg via INTRAVENOUS
  Administered 2023-02-18: 30 mg via INTRAVENOUS

## 2023-02-18 MED ORDER — PHENYLEPHRINE 80 MCG/ML (10ML) SYRINGE FOR IV PUSH (FOR BLOOD PRESSURE SUPPORT)
PREFILLED_SYRINGE | INTRAVENOUS | Status: DC | PRN
  Administered 2023-02-18: 160 ug via INTRAVENOUS
  Administered 2023-02-18: 80 ug via INTRAVENOUS

## 2023-02-18 MED ORDER — ONDANSETRON HCL 4 MG/2ML IJ SOLN
INTRAMUSCULAR | Status: AC
  Filled 2023-02-18: qty 2

## 2023-02-18 MED ORDER — DIATRIZOATE MEGLUMINE 30 % UR SOLN
URETHRAL | Status: DC | PRN
  Administered 2023-02-18: 9 mL via URETHRAL

## 2023-02-18 MED ORDER — EPHEDRINE 5 MG/ML INJ
INTRAVENOUS | Status: AC
  Filled 2023-02-18: qty 5

## 2023-02-18 MED ORDER — PROPOFOL 10 MG/ML IV BOLUS
INTRAVENOUS | Status: AC
  Filled 2023-02-18: qty 20

## 2023-02-18 MED ORDER — CEFAZOLIN IN SODIUM CHLORIDE 3-0.9 GM/100ML-% IV SOLN
INTRAVENOUS | Status: AC
  Filled 2023-02-18: qty 100

## 2023-02-18 MED ORDER — SODIUM CHLORIDE 0.9 % IR SOLN
Status: DC | PRN
  Administered 2023-02-18 (×2): 3000 mL

## 2023-02-18 MED ORDER — CHLORHEXIDINE GLUCONATE 0.12 % MT SOLN
15.0000 mL | Freq: Once | OROMUCOSAL | Status: AC
  Administered 2023-02-18: 15 mL via OROMUCOSAL

## 2023-02-18 MED ORDER — PHENYLEPHRINE HCL-NACL 20-0.9 MG/250ML-% IV SOLN
INTRAVENOUS | Status: AC
  Filled 2023-02-18: qty 250

## 2023-02-18 MED ORDER — ONDANSETRON HCL 4 MG/2ML IJ SOLN
INTRAMUSCULAR | Status: DC | PRN
  Administered 2023-02-18: 4 mg via INTRAVENOUS

## 2023-02-18 MED ORDER — DEXTROSE 5 % IV SOLN: INTRAVENOUS | Status: DC | PRN

## 2023-02-18 SURGICAL SUPPLY — 27 items
BAG DRAIN URO TABLE W/ADPT NS (BAG) ×1 IMPLANT
BAG DRN 8 ADPR NS SKTRN CSTL (BAG) ×1
BAG HAMPER (MISCELLANEOUS) ×1 IMPLANT
CATH INTERMIT  6FR 70CM (CATHETERS) ×1 IMPLANT
CLOTH BEACON ORANGE TIMEOUT ST (SAFETY) ×1 IMPLANT
EXTRACTOR STONE NITINOL NGAGE (UROLOGICAL SUPPLIES) IMPLANT
GLOVE BIO SURGEON STRL SZ8 (GLOVE) ×1 IMPLANT
GLOVE BIOGEL PI IND STRL 7.0 (GLOVE) ×2 IMPLANT
GOWN STRL REUS W/TWL LRG LVL3 (GOWN DISPOSABLE) ×1 IMPLANT
GOWN STRL REUS W/TWL XL LVL3 (GOWN DISPOSABLE) ×1 IMPLANT
GUIDEWIRE STR DUAL SENSOR (WIRE) ×1 IMPLANT
GUIDEWIRE STR ZIPWIRE 035X150 (MISCELLANEOUS) ×1 IMPLANT
IV NS IRRIG 3000ML ARTHROMATIC (IV SOLUTION) ×2 IMPLANT
KIT TURNOVER CYSTO (KITS) ×1 IMPLANT
MANIFOLD NEPTUNE II (INSTRUMENTS) ×1 IMPLANT
PACK CYSTO (CUSTOM PROCEDURE TRAY) ×1 IMPLANT
PAD ARMBOARD 7.5X6 YLW CONV (MISCELLANEOUS) ×1 IMPLANT
SCOPE LITHOVUE DISP (UROLOGICAL SUPPLIES) IMPLANT
SCOPE LITHOVUE DISPOSABLE (UROLOGICAL SUPPLIES) ×1
SET AMPLATZ RENAL DILATOR (MISCELLANEOUS) IMPLANT
STENT URET 6FRX26 CONTOUR (STENTS) IMPLANT
SYR 10ML LL (SYRINGE) ×1 IMPLANT
SYR CONTROL 10ML LL (SYRINGE) ×1 IMPLANT
TOWEL OR 17X26 4PK STRL BLUE (TOWEL DISPOSABLE) ×1 IMPLANT
TRACTIP FLEXIVA PULS ID 200XHI (Laser) IMPLANT
TRACTIP FLEXIVA PULSE ID 200 (Laser) ×1
WATER STERILE IRR 500ML POUR (IV SOLUTION) ×1 IMPLANT

## 2023-02-18 NOTE — Anesthesia Procedure Notes (Signed)
Procedure Name: Intubation Date/Time: 02/18/2023 9:36 AM  Performed by: Julian Reil, CRNAPre-anesthesia Checklist: Patient identified, Emergency Drugs available, Suction available and Patient being monitored Patient Re-evaluated:Patient Re-evaluated prior to induction Oxygen Delivery Method: Circle system utilized Preoxygenation: Pre-oxygenation with 100% oxygen Induction Type: IV induction Ventilation: Mask ventilation without difficulty and Oral airway inserted - appropriate to patient size Laryngoscope Size: Mac and 4 Grade View: Grade II Tube type: Oral Tube size: 7.5 mm Number of attempts: 1 Airway Equipment and Method: Stylet and Oral airway Placement Confirmation: ETT inserted through vocal cords under direct vision, positive ETCO2 and breath sounds checked- equal and bilateral Secured at: 23 cm Tube secured with: Tape Dental Injury: Teeth and Oropharynx as per pre-operative assessment

## 2023-02-18 NOTE — Transfer of Care (Signed)
Immediate Anesthesia Transfer of Care Note  Patient: Robert Warren  Procedure(s) Performed: CYSTOSCOPY WITH RETROGRADE PYELOGRAM, URETEROSCOPY AND STENT PLACEMENT (Left: Ureter) HOLMIUM LASER APPLICATION (Left: Ureter)  Patient Location: PACU  Anesthesia Type:General  Level of Consciousness: awake  Airway & Oxygen Therapy: Patient Spontanous Breathing and Patient connected to face mask oxygen  Post-op Assessment: Report given to RN and Post -op Vital signs reviewed and stable  Post vital signs: Reviewed and stable  Last Vitals:  Vitals Value Taken Time  BP    Temp    Pulse 59 02/18/23 1041  Resp 12 02/18/23 1041  SpO2 100 % 02/18/23 1041  Vitals shown include unvalidated device data.  Last Pain:  Vitals:   02/18/23 0738  PainSc: 0-No pain         Complications: No notable events documented.

## 2023-02-18 NOTE — Progress Notes (Signed)
Cardiology Office Note    Date:  02/18/2023   ID:  Robert Warren, DOB [birth year], MRN [id number]  PCP:  Ignatius Specking, MD  Cardiologist:  Nicki Guadalajara, MD (sleep); Dr. Royann Shivers  4 month F/U sleep evaluation   History of Present Illness:  Robert Warren is a 77 y.o. male who is followed by Dr. Royann Shivers for cardiology care.  He is a [place] [employer] veteran and has a history of atrial fibrillation.  A prior echo Doppler study had shown evidence for cor pulmonale but he had normal left ventricular systolic function.  He has a history of obstructive sleep apnea and has been on CPAP therapy since  2017 His DME company is [employer].  His initial evaluation was a home study with [facility] on March 20, 2016.  Reportedly on that study his apnea index was 58.0, RDI (AHI: RDI) was 74/h.  Central apnea index was 46.8/h.  He has a ResMed air sense 10 CPAP unit with a set up date on May 05, 2016.  When the patient was recently seen by Dr. Royann Shivers on March 05, 2021, he admitted to poor sleep, nonrestorative sleep, and the need to take naps during the day.  I saw him for my initial evaluation with me on April 23, 2021.  Robert Warren admits that he uses CPAP every night.  He typically goes to bed around 8 PM but does not go to sleep and usually watches television or reads.  He typically falls asleep around 1030 to 11 PM.  He does not put his CPAP mask until he is about to fall asleep.  He wakes up at 4 AM somewhat restless and usually for good at 5:45 AM.  He brought his machine with him to the office today.  Last night he had used his CPAP for 5 hours and 51 minutes and his AHI was 2.2.  We were subsequently able to get a download and over the past month he has used a 29 of 30 nights and 16 out of 30 nights had usage greater than 4 hours.  His average use was 4.6 hours.  He cannot sleep on his back.  His pressure averages 13.5 cm of water.  AHI was 6.1.  Central apnea index 3.0/h.  An Epworth Sleepiness  Scale score was calculated in the office today and this endorsed at 8. He has a history of morbid obesity.  Due to a history of PAF he was on anticoagulation with apixaban.  With a history of hypertension he was on lisinopril 40 mg, verapamil 120 mg and was on rosuvastatin 40 mg for hyperlipidemia.  During his initial evaluation, blood pressure was elevated and HCTZ 12.5 mg was added to his medical regimen.  During his initial evaluation with me, I made adjustments to his CPAP unit which had been set at a pressure range previously of 5 to 20 cm.  With his 95th percentile pressure 13.5 with maximum average pressure 14.5 I changed his minimum pressure to 10 cm and continued 20 cm maximal pressure.  I also increased his ramp start pressure.  I discussed the importance of weight loss and exercise both for his cardiovascular health and effects on his severe sleep apnea.  I saw him for follow-up evaluation on October 29, 2021 at which time he felt well.  I obtained a new download from February 5 through October 27, 2021 at his 10 to 20 cm setting range.  He is compliant with usage  at 100% and average use at 6 hours and 26 minutes.  He typically goes to bed and watches television at 9 AM but it may be several hours before he puts the mask on.  AHI is 4.8.  On most days there is no mask leak and there were several days with mild mask leak.  [employer] is his DME company.  Since I saw him, his dose of the verapamil has been increased from 120 mg to 180 mg at the [place] 1 month ago.  He continues to be on lisinopril 40 mg and HCTZ 12.5 mg.  He continues to be on Eliquis for anticoagulation and is unaware of any recurrent atrial fibrillation.  He is on rosuvastatin 40 mg for hyperlipidemia.    Since I saw him, he has been evaluated by Dr. Royann Shivers.  When seen by him on June 01, 2022 he was in a bigeminal rhythm.  He was given clearance to undergo lithotripsy.  An echo Doppler study on June 09, 2022 showed an EF of 50 to 55% with  mild LVH, there was grade 1 diastolic dysfunction.  There was mildly elevated pulmonary artery systolic pressure 37 mm, mild to moderate LA dilatation and aortic valve sclerosis.  He saw Dr. Royann Shivers yesterday for follow-up evaluation and he has had some brief episodes of some shortness of breath with concern for possible PAF.  Apparently, it appears that he was taken off Eliquis at his hospitalization on 08/04/2022.  I saw him on September 29, 2022 for follow-up of his sleep apnea.  I obtained a download from January 8 through September 29, 2022.  Usage was 100% with average use at 7 hours and 37 minutes.  His CPAP AutoSet unit was set at a pressure range of 20 to 20 cm.  AHI is increased at 15.3 with central apnea index of 11.4.  His machine is from 2016 and he started therapy in 2017.  He is CPAP unit is no longer wireless.  He admits to some fatigue.  He is unaware of breakthrough snoring.  In light of his complex sleep apnea I recommended another sleep evaluation with probable need for BiPAP ST or ASV therapy.  His most recent echo Doppler study had shown an EF of 50 to 55%.  He underwent a repeat sleep study on November 30, 2022.  He ultimately was titrated with BiPAP ST to a pressure of 15 over 10 cm of water, and rate at 16.  AHI was 0, RDI 0 and O2 nadir at 94%.  There was no significant central apnea during the titration at only 2.8/h.  He will need a BiPAP ST auto device.  He is followed at the [place].  He is on losartan 50 mg twice a day, metoprolol succinate 25 mg daily, spironolactone 12.5 mg daily, is anticoagulated on Eliquis 5 mg twice a day, and was advised to initiate Jardiance 10 mg.  However since he gets his medications at the [facility] he was given a prescription of 25 mg p.o.  He has been hesitant to take this since they told him to take half pill.  He presents for evaluation.  Past Medical History:  Diagnosis Date   Anxiety    Atrial fibrillation (HCC)    Hypertension    Shortness of breath     Sleep apnea     Past Surgical History:  Procedure Laterality Date   Anal cyst Removal     COLONOSCOPY N/A 06/20/2014   Procedure: COLONOSCOPY;  Surgeon:  Malissa Hippo, MD;  Location: [facility];  Service: Endoscopy;  Laterality: N/A;  1030   COLONOSCOPY WITH PROPOFOL N/A 03/14/2021   Procedure: COLONOSCOPY WITH PROPOFOL;  Surgeon: Dolores Frame, MD;  Location: [facility];  Service: Gastroenterology;  Laterality: N/A;  10:50   CYSTOSCOPY     EXTRACORPOREAL SHOCK WAVE LITHOTRIPSY Left 08/04/2022   Procedure: EXTRACORPOREAL SHOCK WAVE LITHOTRIPSY (ESWL);  Surgeon: Malen Gauze, MD;  Location: [facility] OR[facility];  Service: Urology;  Laterality: Left;   GREEN LIGHT LASER TURP (TRANSURETHRAL RESECTION OF PROSTATE     POLYPECTOMY  03/14/2021   Procedure: POLYPECTOMY;  Surgeon: Dolores Frame, MD;  Location: [facility];  Service: Gastroenterology;;   RIGHT/LEFT HEART CATH AND CORONARY ANGIOGRAPHY N/A 12/31/2022   Procedure: RIGHT/LEFT HEART CATH AND CORONARY ANGIOGRAPHY;  Surgeon: Kathleene Hazel, MD;  Location: [facility];  Service: Cardiovascular;  Laterality: N/A;    Current Medications: Outpatient Medications Prior to Visit  Medication Sig Dispense Refill   apixaban (ELIQUIS) 5 MG TABS tablet Take 5 mg by mouth 2 (two) times daily.     ARIPiprazole (ABILIFY) 15 MG tablet Take 15 mg by mouth daily.     buPROPion (WELLBUTRIN SR) 200 MG 12 hr tablet Take 200 mg by mouth daily as needed (Depression).     diclofenac Sodium (VOLTAREN) 1 % GEL Apply 2 g topically daily as needed (pain).     empagliflozin (JARDIANCE) 10 MG TABS tablet Take 1 tablet (10 mg total) by mouth daily. 90 tablet 3   furosemide (LASIX) 40 MG tablet Take 1 tablet (40 mg total) by mouth daily. 90 tablet 3   latanoprost (XALATAN) 0.005 % ophthalmic solution Place 1 drop into both eyes at bedtime.     losartan (COZAAR) 50 MG tablet Take 1 tablet (50 mg total) by mouth 2 (two) times  daily. 180 tablet 2   metoprolol succinate (TOPROL-XL) 25 MG 24 hr tablet Take 1 tablet (25 mg total) by mouth daily. 90 tablet 3   rosuvastatin (CRESTOR) 40 MG tablet Take 40 mg by mouth daily.     spironolactone (ALDACTONE) 25 MG tablet Take 1/2 tablet (12.5 mg total) by mouth daily. (Patient taking differently: Take 25 mg by mouth daily.) 90 tablet 3   No facility-administered medications prior to visit.     Allergies:   Patient has no known allergies.   Social History   Socioeconomic History   Marital status: Married    Spouse name: Darel Hong   Number of children: 2   Years of education: Not on file   Highest education level: High school graduate  Occupational History   Occupation: Retired  Tobacco Use   Smoking status: Former    Packs/day: 1.00    Years: 30.00    Additional pack years: 0.00    Total pack years: 30.00    Types: Cigarettes    Quit date: 08/23/1996    Years since quitting: 26.5   Smokeless tobacco: Never  Vaping Use   Vaping Use: Never used  Substance and Sexual Activity   Alcohol use: Yes    Comment: occasional   Drug use: No   Sexual activity: Yes  Other Topics Concern   Not on file  Social History Narrative   Not on file   Social Determinants of Health   Financial Resource Strain: Low Risk  (12/31/2022)   Overall Financial Resource Strain (CARDIA)    Difficulty of Paying Living Expenses: Not very hard  Food Insecurity: No Food Insecurity (12/28/2022)   Hunger Vital Sign    Worried About Running Out of Food in the Last Year: Never true    Ran Out of Food in the Last Year: Never true  Transportation Needs: No Transportation Needs (12/31/2022)   PRAPARE - Administrator, Civil Service (Medical): No    Lack of Transportation (Non-Medical): No  Physical Activity: Not on file  Stress: Not on file  Social Connections: Not on file    Additional social history is notable that he is married for 48 years.  He has 2 children and 6 grandchildren.   He previously had worked as a Museum/gallery exhibitions officer at [employer].  He smoked from age 25 and quit smoking in 1999.  Family History:  The patient's family history includes Cancer - Lung in his father; Heart attack in his father; Heart disease in his mother.  His mother died at age 21 with heart disease and father died at age 37 with cancer.  ROS General: Negative; No fevers, chills, or night sweats; morbid obesity HEENT: Negative; No changes in vision or hearing, sinus congestion, difficulty swallowing Pulmonary: Negative; No cough, wheezing, shortness of breath, hemoptysis Cardiovascular: Positive for hypertension, PAF GI: Negative; No nausea, vomiting, diarrhea, or abdominal pain GU: Negative; No dysuria, hematuria, or difficulty voiding Musculoskeletal: Negative; no myalgias, joint pain, or weakness Hematologic/Oncology: Negative; no easy bruising, bleeding Endocrine: Negative; no heat/cold intolerance; no diabetes Neuro: Negative; no changes in balance, headaches Skin: Negative; No rashes or skin lesions Psychiatric: Negative; No behavioral problems, depression Sleep: Positive for OSA, on therapy since September 2017.  Admits to fatigability, daytime sleepiness, cannot sleep on his back; negative; No bruxism, restless legs, hypnogognic hallucinations, no cataplexy Other comprehensive 14 point system review is negative.   PHYSICAL EXAM:   VS:  BP 136/86   Pulse (!) 55   Ht 6\' 4"  (1.93 m)   Wt (!) 337 lb 12.8 oz (153.2 kg)   SpO2 95%   BMI 41.12 kg/m     Repeat blood pressure by me was 110/78  Wt Readings from Last 3 Encounters:  02/17/23 (!) 337 lb 12.8 oz (153.2 kg)  02/15/23 (!) 342 lb (155.1 kg)  01/28/23 (!) 342 lb (155.1 kg)    General: Alert, oriented, no distress.  Skin: normal turgor, no rashes, warm and dry HEENT: Normocephalic, atraumatic. Pupils equal round and reactive to light; sclera anicteric; extraocular muscles intact;  Nose without nasal septal  hypertrophy Mouth/Parynx benign; Mallinpatti scale 4 Neck: No JVD, no carotid bruits; normal carotid upstroke Lungs: clear to ausculatation and percussion; no wheezing or rales Chest wall: without tenderness to palpitation Heart: PMI not displaced, RRR, s1 s2 normal, 1/6 systolic murmur, no diastolic murmur, no rubs, gallops, thrills, or heaves Abdomen: Central adiposity; soft, nontender; no hepatosplenomehaly, BS+; abdominal aorta nontender and not dilated by palpation. Back: no CVA tenderness Pulses 2+ Musculoskeletal: full range of motion, normal strength, no joint deformities Extremities: no clubbing cyanosis or edema, Homan's sign negative  Neurologic: grossly nonfocal; Cranial nerves grossly wnl Psychologic: Normal mood and affect    Studies/Labs Reviewed:   EKG Interpretation Date/Time:  Wednesday February 17 2023 11:10:02 EDT Ventricular Rate:  55 PR Interval:  218 QRS Duration:  92 QT Interval:  432 QTC Calculation: 413 R Axis:   -17  Text Interpretation: Sinus bradycardia with 1st degree A-V block Nonspecific T wave abnormality When compared with ECG of 20-Jan-2023 10:09, Premature ventricular complexes are no longer  Present Confirmed by Nicki Guadalajara ([id number]) on 02/18/2023 8:14:39 PM    I personally reviewed the ECG from June 01, 2022:  Sinus rhythm with bigeminy, NSSTT wave abnormality   October 29, 2021 ECG (independently read by me):Sinus bradycardia at 51 with sinus arrythmia; 1st degree AV block, PR 212 msec  April 23, 2021 ECG (independently read by me): Sinus bradycardia at 58, 1st degree AV block  Recent Labs:    Latest Ref Rng & Units 01/20/2023   10:42 AM 01/01/2023   11:15 AM 12/31/2022    8:09 AM  BMP  Glucose 70 - 99 mg/dL 96  70    BUN 8 - 23 mg/dL 11  21    Creatinine 6.04 - 1.24 mg/dL 5.40  9.81    Sodium 191 - 145 mmol/L 140  142  142   Potassium 3.5 - 5.1 mmol/L 3.9  4.2  3.4   Chloride 98 - 111 mmol/L 104  107    CO2 22 - 32 mmol/L 27  26    Calcium  8.9 - 10.3 mg/dL 9.4  9.0          Latest Ref Rng & Units 12/29/2022    4:22 AM 04/05/2022    6:08 AM 04/04/2022    4:51 AM  Hepatic Function  Total Protein 6.5 - 8.1 g/dL 7.4  6.2  6.6   Albumin 3.5 - 5.0 g/dL 3.7  2.6  2.8   AST 15 - 41 U/L 16  87  153   ALT 0 - 44 U/L 16  74  90   Alk Phosphatase 38 - 126 U/L 67  63  68   Total Bilirubin 0.3 - 1.2 mg/dL 1.7  0.7  1.0        Latest Ref Rng & Units 01/01/2023   11:15 AM 12/31/2022    8:09 AM 12/31/2022    8:03 AM  CBC  WBC 4.0 - 10.5 K/uL 4.9     Hemoglobin 13.0 - 17.0 g/dL 47.8  29.5  62.1    30.8   Hematocrit 39.0 - 52.0 % 46.0  44.0  44.0    45.0   Platelets 150 - 400 K/uL 207      Lab Results  Component Value Date   MCV 84.9 01/01/2023   MCV 84.8 12/29/2022   MCV 85.3 12/28/2022   Lab Results  Component Value Date   TSH 1.968 02/25/2016   No results found for: "HGBA1C"   BNP    Component Value Date/Time   BNP 158.6 (H) 01/20/2023 1042    ProBNP No results found for: "PROBNP"   Lipid Panel     Component Value Date/Time   CHOL 132 12/31/2022 0052   TRIG 67 12/31/2022 0052   HDL 24 (L) 12/31/2022 0052   CHOLHDL 5.5 12/31/2022 0052   VLDL 13 12/31/2022 0052   LDLCALC 95 12/31/2022 0052     RADIOLOGY: DG C-Arm 1-60 Min-No Report  Result Date: 02/18/2023 Fluoroscopy was utilized by the requesting physician.  No radiographic interpretation.     Additional studies/ records that were reviewed today include:  I was able to obtain the patient's prior sleep evaluation which was a home study with [facility] on March 20, 2016 as noted above.  I reviewed the records of Dr. Royann Shivers.  I obtained a new download from his CPAP machine which he brought with him today.  His CPAP machine is from 2017, although the download stated August 24, 2020 which is incorrect  ASSESSMENT:    1. Complex sleep apnea syndrome   2. Paroxysmal atrial fibrillation (HCC)   3. Essential hypertension   4. NICM (nonischemic  cardiomyopathy) (HCC)   5. Anticoagulated   6. Morbid obesity [facility])     PLAN:  Robert Warren is a 77 year old African-American gentleman is a [place] [employer] veteran and has a history of atrial fibrillation with rapid ventricular response which was initially diagnosed in 2017 during an episode of urosepsis.  At that time, an echo Doppler study showed normal systolic function but there was evidence for cor pulmonale.  Nuclear stress test in 2017 did not show any evidence of ischemia.  He has a history of hyperlipidemia for which he has been on rosuvastatin.  He continues to be on anticoagulation with apixaban.  He was diagnosed with severe sleep apnea on initial home study in 2017 and apparently was set up by [employer] for CPAP therapy on May 05, 2016.  He has been on therapy ever since.  Presently, he has been going to bed at 8 PM but typically does not attempt to sleep until 1030 or 11.  He usually watches television in bed or reads but at times he does doze off during this.  When I initially saw him, I discussed optimal sleep hygiene and recommended that he use bed predominantly for sleep and not to wait several hours before putting his mask on.  In addition, previously he was taking his mask off around 4 AM going to the bathroom and may not put it back on for several hours.  I discussed with him optimal sleep duration at 7 and 9 hours and that he should try using CPAP for ideally 8 hours duration if at all possible.   When I saw him for my initial evaluation I discussed in detail potential adverse consequences of sleep apnea regarding his cardiovascular health.  I obtained a download from January 8 through September 29, 2022.  When evaluated by me in February 2024 he was having significant central events had an overall AHI of 15.3 with central apnea index of 11.4.  Although he was compliant with his current CPAP device, I recommended follow-up evaluation and he underwent BiPAP ST titration study was  recommended to have a trial of BiPAP ST auto with initial EPAP set at 10 cm, IPAP at 15 rate 16.  I will need to see him in 3 to 4 months for follow-up evaluation following receiving his new BiPAP ST device.  I also discussed with our pharmacist there was recommended that he take Jardiance and cut the 25 mg pill in half which will give him 12.5 mg rather than the typically prescribed 10 mg dose.  He continues to be anticoagulated on Eliquis.  Blood pressure today is stable on current therapy.    Medication Adjustments/Labs and Tests Ordered: Current medicines are reviewed at length with the patient today.  Concerns regarding medicines are outlined above.  Medication changes, Labs and Tests ordered today are listed in the Patient Instructions below. Patient Instructions  Medication Instructions:  Cut Jardiance 25mg  tablets in half   *If you need a refill on your cardiac medications before your next appointment, please call your pharmacy*   Follow-Up: At [facility], you and your health needs are our priority.  As part of our continuing mission to provide you with exceptional heart care, we have created designated Provider Care Teams.  These Care Teams include your primary Cardiologist (physician) and Advanced Practice Providers (APPs -  Physician Assistants and Nurse Practitioners) who all work together to provide you with the care you need, when you need it.  We recommend signing up for the patient portal called "MyChart".  Sign up information is provided on this After Visit Summary.  MyChart is used to connect with patients for Virtual Visits (Telemedicine).  Patients are able to view lab/test results, encounter notes, upcoming appointments, etc.  Non-urgent messages can be sent to your provider as well.   To learn more about what you can do with MyChart, go to [email or web address].    Your next appointment:    October/November with Dr. Tresa Endo -- sleep clinic   Signed, Nicki Guadalajara, MD, [facility], ABSM Diplomate, American Board of Sleep Medicine  02/18/2023 8:23 PM    [facility] [address], [place], [place]  [address] Phone: [phone]

## 2023-02-18 NOTE — Anesthesia Preprocedure Evaluation (Signed)
Anesthesia Evaluation  Patient identified by MRN, date of birth, ID band Patient awake    Reviewed: Allergy & Precautions, H&P , NPO status , Patient's Chart, lab work & pertinent test results, reviewed documented beta blocker date and time   Airway Mallampati: II  TM Distance: >3 FB Neck ROM: full    Dental no notable dental hx.    Pulmonary neg pulmonary ROS, shortness of breath, sleep apnea , former smoker   Pulmonary exam normal breath sounds clear to auscultation       Cardiovascular Exercise Tolerance: Good hypertension, +CHF  + dysrhythmias Atrial Fibrillation  Rhythm:regular Rate:Normal     Neuro/Psych   Anxiety     negative neurological ROS  negative psych ROS   GI/Hepatic negative GI ROS, Neg liver ROS,,,  Endo/Other    Morbid obesity  Renal/GU CRFRenal diseasenegative Renal ROS  negative genitourinary   Musculoskeletal   Abdominal   Peds  Hematology negative hematology ROS (+) Blood dyscrasia, anemia   Anesthesia Other Findings   Reproductive/Obstetrics negative OB ROS                             Anesthesia Physical Anesthesia Plan  ASA: 3  Anesthesia Plan: General   Post-op Pain Management:    Induction:   PONV Risk Score and Plan: Ondansetron  Airway Management Planned:   Additional Equipment:   Intra-op Plan:   Post-operative Plan:   Informed Consent: I have reviewed the patients History and Physical, chart, labs and discussed the procedure including the risks, benefits and alternatives for the proposed anesthesia with the patient or authorized representative who has indicated his/her understanding and acceptance.     Dental Advisory Given  Plan Discussed with: CRNA  Anesthesia Plan Comments:        Anesthesia Quick Evaluation

## 2023-02-18 NOTE — H&P (Signed)
HPI: [name] is a 77yo here for followup for nephrolithiasis. No stone events since last visit. CT stone study from 5/17 shows multiple left distal ureteral calculus. No flank pain. No LUTS. No other complaints today     PMH:     Past Medical History:  Diagnosis Date   Anxiety     Atrial fibrillation (HCC)     Hypertension     Shortness of breath     Sleep apnea        Surgical History:      Past Surgical History:  Procedure Laterality Date   Anal cyst Removal       COLONOSCOPY N/A 06/20/2014    Procedure: COLONOSCOPY;  Surgeon: Malissa Hippo, MD;  Location: [facility];  Service: Endoscopy;  Laterality: N/A;  1030   COLONOSCOPY WITH PROPOFOL N/A 03/14/2021    Procedure: COLONOSCOPY WITH PROPOFOL;  Surgeon: Dolores Frame, MD;  Location: [facility];  Service: Gastroenterology;  Laterality: N/A;  10:50   CYSTOSCOPY       EXTRACORPOREAL SHOCK WAVE LITHOTRIPSY Left 08/04/2022    Procedure: EXTRACORPOREAL SHOCK WAVE LITHOTRIPSY (ESWL);  Surgeon: Malen Gauze, MD;  Location: [facility];  Service: Urology;  Laterality: Left;   GREEN LIGHT LASER TURP (TRANSURETHRAL RESECTION OF PROSTATE       POLYPECTOMY   03/14/2021    Procedure: POLYPECTOMY;  Surgeon: Dolores Frame, MD;  Location: [facility];  Service: Gastroenterology;;   RIGHT/LEFT HEART CATH AND CORONARY ANGIOGRAPHY N/A 12/31/2022    Procedure: RIGHT/LEFT HEART CATH AND CORONARY ANGIOGRAPHY;  Surgeon: Kathleene Hazel, MD;  Location: [facility];  Service: Cardiovascular;  Laterality: N/A;      Home Medications:  Allergies as of 01/15/2023   No Known Allergies         Medication List           Accurate as of Jan 15, 2023 12:07 PM. If you have any questions, ask your nurse or doctor.              ARIPiprazole 15 MG tablet Commonly known as: ABILIFY Take 15 mg by mouth daily.    buPROPion 200 MG 12 hr tablet Commonly known as: WELLBUTRIN SR Take 200 mg by mouth  daily as needed (Depression).    diclofenac Sodium 1 % Gel Commonly known as: VOLTAREN Apply 2 g topically as needed (pain).    Eliquis 5 MG Tabs tablet Generic drug: apixaban Take 5 mg by mouth 2 (two) times daily.    furosemide 40 MG tablet Commonly known as: LASIX Take 1 tablet (40 mg total) by mouth daily as needed for fluid or edema. Take for 3 lb weight gain overnight or a 5 lb weight gain in 1 week.    Jardiance 10 MG Tabs tablet Generic drug: empagliflozin Take 1 tablet (10 mg total) by mouth daily.    latanoprost 0.005 % ophthalmic solution Commonly known as: XALATAN 1 drop at bedtime.    losartan 50 MG tablet Commonly known as: COZAAR Take 1 tablet (50 mg total) by mouth daily.    metoprolol succinate 25 MG 24 hr tablet Commonly known as: TOPROL-XL Take 1 tablet (25 mg total) by mouth daily.    rosuvastatin 40 MG tablet Commonly known as: CRESTOR Take 40 mg by mouth daily.    spironolactone 25 MG tablet Commonly known as: Aldactone Take 1/2 tablet (12.5 mg total) by mouth daily.  Allergies: No Known Allergies   Family History:      Family History  Problem Relation Age of Onset   Heart disease Mother     Cancer - Lung Father     Heart attack Father        Social History:  reports that he quit smoking about 26 years ago. His smoking use included cigarettes. He has a 30.00 pack-year smoking history. He has never used smokeless tobacco. He reports current alcohol use. He reports that he does not use drugs.   ROS: All other review of systems were reviewed and are negative except what is noted above in HPI   Physical Exam: BP 91/62   Pulse (!) 46   Constitutional:  Alert and oriented, No acute distress. HEENT: [place] AT, moist mucus membranes.  Trachea midline, no masses. Cardiovascular: No clubbing, cyanosis, or edema. Respiratory: Normal respiratory effort, no increased work of breathing. GI: Abdomen is soft, nontender, nondistended, no  abdominal masses GU: No CVA tenderness.  Lymph: No cervical or inguinal lymphadenopathy. Skin: No rashes, bruises or suspicious lesions. Neurologic: Grossly intact, no focal deficits, moving all 4 extremities. Psychiatric: Normal mood and affect.   Laboratory Data: Recent Labs       Lab Results  Component Value Date    WBC 4.9 01/01/2023    HGB 14.5 01/01/2023    HCT 46.0 01/01/2023    MCV 84.9 01/01/2023    PLT 207 01/01/2023        Recent Labs       Lab Results  Component Value Date    CREATININE 1.29 (H) 01/01/2023        Recent Labs  No results found for: "PSA"     Recent Labs  No results found for: "TESTOSTERONE"     Recent Labs  No results found for: "HGBA1C"     Urinalysis Labs (Brief)          Component Value Date/Time    COLORURINE AMBER (A) 04/03/2022 1409    APPEARANCEUR Clear 10/05/2022 1103    LABSPEC 1.025 04/03/2022 1409    PHURINE 5.0 04/03/2022 1409    GLUCOSEU Negative 10/05/2022 1103    HGBUR LARGE (A) 04/03/2022 1409    BILIRUBINUR Negative 10/05/2022 1103    KETONESUR NEGATIVE 04/03/2022 1409    PROTEINUR 3+ (A) 10/05/2022 1103    PROTEINUR >=300 (A) 04/03/2022 1409    NITRITE Negative 10/05/2022 1103    NITRITE NEGATIVE 04/03/2022 1409    LEUKOCYTESUR Negative 10/05/2022 1103    LEUKOCYTESUR MODERATE (A) 04/03/2022 1409        Recent Labs       Lab Results  Component Value Date    LABMICR See below: 10/05/2022    WBCUA 0-5 10/05/2022    LABEPIT 0-10 10/05/2022    MUCUS Present 04/23/2022    BACTERIA None seen 10/05/2022        Pertinent Imaging: CT 01/08/2023: Images reviewed and discussed with the patient  Results for orders placed during the hospital encounter of 10/05/22   Abdomen 1 view (KUB)   Narrative CLINICAL DATA:  Nephrolithiasis.   EXAM: ABDOMEN - 1 VIEW   COMPARISON:  08/25/2022.   FINDINGS: The bowel gas pattern is normal. No renal or ureteral calculus is seen bilaterally. Degenerative changes  in the thoracolumbar spine.   IMPRESSION: No renal or ureteral calculus.     Electronically Signed By: Thornell Sartorius M.D. On: 10/06/2022 02:10   No results found for this or any  previous visit.   No results found for this or any previous visit.   No results found for this or any previous visit.   Results for orders placed during the hospital encounter of 08/06/22   US RENAL   Narrative CLINICAL DATA:  Follow-up kidney stones after ESWL.   EXAM: RENAL / URINARY TRACT ULTRASOUND COMPLETE   COMPARISON:  CT scan of the abdomen and pelvis without contrast April 03, 2022   FINDINGS: Right Kidney:   Renal measurements: 13.9 x 5.6 x 6.9 cm = volume: 281 mL. Echogenicity within normal limits. No mass or hydronephrosis visualized.   Left Kidney:   Renal measurements: 13.8 x 6.8 x 6.1 cm = volume: 3 L1 mL. There is a shadowing mildly echogenic region identified in the mid to lower left kidney measuring up to 16 mm suggestive of a nonobstructive stone. Contains 2 adjacent cysts measuring 2.4 and 2.0 cm. No follow-up imaging recommended for the cysts.   Bladder:   Appears normal for degree of bladder distention.   Other:   None.   IMPRESSION: 1. 16 mm nonobstructive stone in the mid to lower left kidney. 2. No other significant abnormalities.     Electronically Signed By: Gerome Sam III M.D. On: 08/07/2022 13:40   No valid procedures specified. No results found for this or any previous visit.   Results for orders placed during the hospital encounter of 01/05/23   CT RENAL STONE STUDY   Narrative CLINICAL DATA:  Follow-up renal calculi post treatment (ESWL).   EXAM: CT ABDOMEN AND PELVIS WITHOUT CONTRAST   TECHNIQUE: Multidetector CT imaging of the abdomen and pelvis was performed following the standard protocol without IV contrast.   RADIATION DOSE REDUCTION: This exam was performed according to the departmental dose-optimization program which  includes automated exposure control, adjustment of the mA and/or kV according to patient size and/or use of iterative reconstruction technique.   COMPARISON:  04/03/2022   FINDINGS: Lower chest: The lung bases are clear of acute process. No pleural effusion or pulmonary lesions. Moderate eventration of the right hemidiaphragm with overlying vascular crowding and streaky atelectasis. The heart is normal in size. No pericardial effusion. The distal esophagus and aorta are unremarkable.   Hepatobiliary: No hepatic lesions or intrahepatic biliary dilatation. Gallbladder is grossly normal. No common bile duct dilatation.   Pancreas: No mass, inflammation or ductal dilatation.   Spleen: Normal size.  No focal lesions.   Adrenals/Urinary Tract: The adrenal glands are normal.   The interpolar left renal calculus is no longer identified. No small stone fragments. No right-sided renal calculi.   No left-sided hydroureteronephrosis but there are nonobstructing stone fragments in the distal left ureter. Suspect 3 or 4 stone fragments. The stone fragment in the UVJ measures approximately 6 mm. There are 2 smaller more proximal calculi and a larger most proximal calculus measuring 6 mm.   No bladder calculi.   Stomach/Bowel: The stomach, duodenum, small bowel and colon grossly normal. The terminal ileum and appendix are normal. There is scattered colonic diverticulosis but no findings for acute diverticulitis.   Vascular/Lymphatic: Normal caliber abdominal aorta. Minimal scattered atherosclerotic calcifications. No mesenteric or retroperitoneal mass or adenopathy.   Reproductive: The prostate gland and seminal vesicles are unremarkable. Suspect prior TURP.   Other: No pelvic mass or adenopathy. No free pelvic fluid collections. No inguinal mass or adenopathy. Small periumbilical abdominal wall hernia containing fat and supraumbilical abdominal wall hernia containing fat. This is  located approximately 8 cm  above the umbilicus.   Musculoskeletal: No significant bony findings. Stable degenerative changes involving the spine.   IMPRESSION: 1. The interpolar left renal calculus is no longer identified. No residual small stone fragments in the kidney. 2. There are 3 or 4 nonobstructing stone fragments in the distal left ureter. The stone fragment in the UVJ measures approximately 6 mm. There are 2 smaller more proximal calculi and a larger most proximal calculus measuring 6 mm. 3. No right-sided renal or ureteral calculi. 4. No acute abdominal/pelvic findings, mass lesions or adenopathy. 5. Small periumbilical abdominal wall hernia containing fat and supraumbilical abdominal wall hernia containing fat.     Electronically Signed By: Rudie Meyer M.D. On: 01/08/2023 09:57     Assessment & Plan:     1. Kidney stone -We discussed the management of kidney stones. These options include observation, ureteroscopy, shockwave lithotripsy (ESWL) and percutaneous nephrolithotomy (PCNL). We discussed which options are relevant to the patient's stone(s). We discussed the natural history of kidney stones as well as the complications of untreated stones and the impact on quality of life without treatment as well as with each of the above listed treatments. We also discussed the efficacy of each treatment in its ability to clear the stone burden. With any of these management options I discussed the signs and symptoms of infection and the need for emergent treatment should these be experienced. For each option we discussed the ability of each procedure to clear the patient of their stone burden.   For observation I described the risks which include but are not limited to silent renal damage, life-threatening infection, need for emergent surgery, failure to pass stone and pain.   For ureteroscopy I described the risks which include bleeding, infection, damage to contiguous  structures, positioning injury, ureteral stricture, ureteral avulsion, ureteral injury, need for prolonged ureteral stent, inability to perform ureteroscopy, need for an interval procedure, inability to clear stone burden, stent discomfort/pain, heart attack, stroke, pulmonary embolus and the inherent risks with general anesthesia.   For shockwave lithotripsy I described the risks which include arrhythmia, kidney contusion, kidney hemorrhage, need for transfusion, pain, inability to adequately break up stone, inability to pass stone fragments, Steinstrasse, infection associated with obstructing stones, need for alternate surgical procedure, need for repeat shockwave lithotripsy, MI, CVA, PE and the inherent risks with anesthesia/conscious sedation.   For PCNL I described the risks including positioning injury, pneumothorax, hydrothorax, need for chest tube, inability to clear stone burden, renal laceration, arterial venous fistula or malformation, need for embolization of kidney, loss of kidney or renal function, need for repeat procedure, need for prolonged nephrostomy tube, ureteral avulsion, MI, CVA, PE and the inherent risks of general anesthesia.   - The patient would like to proceed with left ureteroscopic stone extraction - Urinalysis, Routine w reflex microscopic     No follow-ups on file.   Wilkie Aye, MD   [facility] [place]

## 2023-02-18 NOTE — Op Note (Signed)
.  Preoperative diagnosis: Left ureteral stone  Postoperative diagnosis: Same  Procedure: 1 cystoscopy 2. Left retrograde pyelography 3.  Intraoperative fluoroscopy, under one hour, with interpretation 4.  Left ureteroscopic stone manipulation with laser lithotripsy 5.  Left 6 x 26 JJ stent placement  Attending: Cleda Mccreedy  Anesthesia: General  Estimated blood loss: None  Drains: Left 6 x 26 JJ ureteral stent with tether  Specimens: stone for analysis  Antibiotics: ancef  Findings: left distal ureteral stones. Mild hydronephrosis. No masses/lesions in the bladder. Ureteral orifices in normal anatomic location.  Indications: Patient is a 77 year old male with a history of left ureteral stone who has failed medical expulsive therapy.  After discussing treatment options, he decided proceed with left ureteroscopic stone manipulation.  Procedure in detail: The patient was brought to the operating room and a brief timeout was done to ensure correct patient, correct procedure, correct site.  General anesthesia was administered patient was placed in dorsal lithotomy position.  His genitalia was then prepped and draped in usual sterile fashion.  A rigid 22 French cystoscope was passed in the urethra and the bladder.  Bladder was inspected free masses or lesions.  the ureteral orifices were in the normal orthotopic locations.  a 6 french ureteral catheter was then instilled into the left ureteral orifice.  a gentle retrograde was obtained and findings noted above.  we then placed a zip wire through the ureteral catheter and advanced up to the renal pelvis.  we then removed the cystoscope and cannulated the left ureteral orifice with a flexible ureteroscope. We encountered multiple stones in the distal ureter. Using a 200 nm laser fiber the stones were fragmented.  the fragments were then removed with a engage basket.  We then placed and good coil was noted in the the renal pelvis under  fluoroscopy and the bladder under direct vision.   once all stone fragments were removed we then placed a 6 x 26 double-j ureteral stent over the original zip wire.  the stone fragments were then removed from the bladder and sent for analysis.   the bladder was then drained and this concluded the procedure which was well tolerated by patient.  Complications: None  Condition: Stable, extubated, transferred to PACU  Plan: Patient is to be discharged home as to follow-up in one week. He is to remove his stent in 72 hours by pulling the tether

## 2023-02-19 ENCOUNTER — Encounter (HOSPITAL_COMMUNITY): Payer: Self-pay | Admitting: Urology

## 2023-02-19 NOTE — Anesthesia Postprocedure Evaluation (Signed)
Anesthesia Post Note  Patient: Robert Warren  Procedure(s) Performed: CYSTOSCOPY WITH RETROGRADE PYELOGRAM, URETEROSCOPY AND STENT PLACEMENT (Left: Ureter) HOLMIUM LASER APPLICATION (Left: Ureter)  Patient location during evaluation: Phase II Anesthesia Type: General Level of consciousness: awake Pain management: pain level controlled Vital Signs Assessment: post-procedure vital signs reviewed and stable Respiratory status: spontaneous breathing and respiratory function stable Cardiovascular status: blood pressure returned to baseline and stable Postop Assessment: no headache and no apparent nausea or vomiting Anesthetic complications: no Comments: Late entry   No notable events documented.   Last Vitals:  Vitals:   02/18/23 1100 02/18/23 1113  BP: 125/82 112/81  Pulse: 63 64  Resp: 19 19  Temp:  36.7 C  SpO2: 97% 95%    Last Pain:  Vitals:   02/18/23 1113  PainSc: 4                  Windell Norfolk

## 2023-02-21 DIAGNOSIS — I10 Essential (primary) hypertension: Secondary | ICD-10-CM | POA: Diagnosis not present

## 2023-02-24 ENCOUNTER — Ambulatory Visit: Payer: Medicare Other | Admitting: Urology

## 2023-02-24 ENCOUNTER — Telehealth: Payer: Self-pay

## 2023-02-24 DIAGNOSIS — N200 Calculus of kidney: Secondary | ICD-10-CM

## 2023-02-24 NOTE — Telephone Encounter (Addendum)
Called patient regarding results. Patient had understanding of results.----- Message from Cannon Kettle, PA-C sent at 02/24/2023 12:30 PM EDT ----- PVC burden 3.6% -not significant enough to impact heart function. Occasional episodes of brief nonsustained ventricular tachycardia, as well as sinus bradycardia, first-degree AV block, second-degree AV block Mobitz type I. Significant bradycardia and pauses up to 2.6 seconds duration of seen only during expected sleep hours.  There is no evidence of high-grade AV block or sustained ventricular tachycardia or atrial fibrillation.  There is no clear correlation between patient's symptoms and rhythm, based on patient triggered events.  Based on these results, would continue current therapy.  Continue sleep apnea treatment with Dr. Tresa Endo.  Keep appointment in September with recheck echo to see if heart function is improved with medical therapy.

## 2023-03-01 LAB — CALCULI, WITH PHOTOGRAPH (CLINICAL LAB)
Calcium Oxalate Dihydrate: 10 %
Calcium Oxalate Monohydrate: 90 %
Weight Calculi: 82 mg

## 2023-03-01 NOTE — Telephone Encounter (Signed)
I am away on vacation this week and next week in hospital, not in office until the following week.

## 2023-03-03 ENCOUNTER — Encounter: Payer: Self-pay | Admitting: Urology

## 2023-03-03 ENCOUNTER — Ambulatory Visit (INDEPENDENT_AMBULATORY_CARE_PROVIDER_SITE_OTHER): Payer: Medicare Other | Admitting: Urology

## 2023-03-03 VITALS — BP 107/70 | HR 55

## 2023-03-03 DIAGNOSIS — N200 Calculus of kidney: Secondary | ICD-10-CM | POA: Diagnosis not present

## 2023-03-03 DIAGNOSIS — N481 Balanitis: Secondary | ICD-10-CM

## 2023-03-03 MED ORDER — CLOTRIMAZOLE-BETAMETHASONE 1-0.05 % EX CREA
1.0000 | TOPICAL_CREAM | Freq: Two times a day (BID) | CUTANEOUS | 3 refills | Status: AC
Start: 2023-03-03 — End: ?

## 2023-03-03 NOTE — Progress Notes (Signed)
03/03/2023 3:43 PM   Robert Warren [birth year] [id number]  Referring provider: Ignatius Specking, MD [address] [place],  [place] [address]  Followup nephrolithiasis   HPI: Robert Warren is a 77yo here for followup for nephrolithiasis. He removed his stent POD#3. He denies any Warren pain. NO worsening LUTS. No gross hematuria. For the past 4 days he has noted worsening penile irritation. He has a hx of balanitis.     PMH: Past Medical History:  Diagnosis Date   Anxiety    Atrial fibrillation (HCC)    Hypertension    Shortness of breath    Sleep apnea     Surgical History: Past Surgical History:  Procedure Laterality Date   Anal cyst Removal     COLONOSCOPY N/A 06/20/2014   Procedure: COLONOSCOPY;  Surgeon: Malissa Hippo, MD;  [facility]: [facility];  Service: Endoscopy;  Laterality: N/A;  1030   COLONOSCOPY WITH PROPOFOL N/A 03/14/2021   Procedure: COLONOSCOPY WITH PROPOFOL;  Surgeon: Dolores Frame, MD;  Location: [facility];  Service: Gastroenterology;  Laterality: N/A;  10:50   CYSTOSCOPY     CYSTOSCOPY WITH RETROGRADE PYELOGRAM, URETEROSCOPY AND STENT PLACEMENT Left 02/18/2023   Procedure: CYSTOSCOPY WITH RETROGRADE PYELOGRAM, URETEROSCOPY AND STENT PLACEMENT;  Surgeon: Malen Gauze, MD;  Location: [facility];  Service: Urology;  Laterality: Left;   EXTRACORPOREAL SHOCK WAVE LITHOTRIPSY Left 08/04/2022   Procedure: EXTRACORPOREAL SHOCK WAVE LITHOTRIPSY (ESWL);  Surgeon: Malen Gauze, MD;  Location: [facility];  Service: Urology;  Laterality: Left;   GREEN LIGHT LASER TURP (TRANSURETHRAL RESECTION OF PROSTATE     HOLMIUM LASER APPLICATION Left 02/18/2023   Procedure: HOLMIUM LASER APPLICATION;  Surgeon: Malen Gauze, MD;  Location: [facility];  Service: Urology;  Laterality: Left;   POLYPECTOMY  03/14/2021   Procedure: POLYPECTOMY;  Surgeon: Dolores Frame, MD;  Location: [facility];  Service: Gastroenterology;;   RIGHT/LEFT HEART CATH AND  CORONARY ANGIOGRAPHY N/A 12/31/2022   Procedure: RIGHT/LEFT HEART CATH AND CORONARY ANGIOGRAPHY;  Surgeon: Kathleene Hazel, MD;  Location: [facility];  Service: Cardiovascular;  Laterality: N/A;    Home Medications:  Allergies as of 03/03/2023   No Known Allergies      Medication List        Accurate as of March 03, 2023  3:43 PM. If you have any questions, ask your nurse or doctor.          ARIPiprazole 15 MG tablet Commonly known as: ABILIFY Take 15 mg by mouth daily.   buPROPion 200 MG 12 hr tablet Commonly known as: WELLBUTRIN SR Take 200 mg by mouth daily as needed (Depression).   diclofenac Sodium 1 % Gel Commonly known as: VOLTAREN Apply 2 g topically daily as needed (pain).   Eliquis 5 MG Tabs tablet Generic drug: apixaban Take 5 mg by mouth 2 (two) times daily.   empagliflozin 10 MG Tabs tablet Commonly known as: JARDIANCE Take 1 tablet (10 mg total) by mouth daily.   furosemide 40 MG tablet Commonly known as: LASIX Take 1 tablet (40 mg total) by mouth daily.   HYDROcodone-acetaminophen 5-325 MG tablet Commonly known as: Norco Take 1 tablet by mouth every 6 (six) hours as needed for moderate pain.   latanoprost 0.005 % ophthalmic solution Commonly known as: XALATAN Place 1 drop into both eyes at bedtime.   losartan 50 MG tablet Commonly known as: COZAAR Take 1 tablet (50 mg total) by mouth 2 (two) times  daily.   metoprolol succinate 25 MG 24 hr tablet Commonly known as: TOPROL-XL Take 1 tablet (25 mg total) by mouth daily.   rosuvastatin 40 MG tablet Commonly known as: CRESTOR Take 40 mg by mouth daily.   spironolactone 25 MG tablet Commonly known as: Aldactone Take 1/2 tablet (12.5 mg total) by mouth daily. What changed: how much to take        Allergies: No Known Allergies  Family History: Family History  Problem Relation Age of Onset   Heart disease Mother    Cancer - Lung Father    Heart attack Father      Social History:  reports that he quit smoking about 26 years ago. His smoking use included cigarettes. He has a 30.00 pack-year smoking history. He has never used smokeless tobacco. He reports current alcohol use. He reports that he does not use drugs.  ROS: All other review of systems were reviewed and are negative except what is noted above in HPI  Physical Exam: BP 107/70   Pulse (!) 55   Constitutional:  Alert and oriented, No acute distress. HEENT: [place] AT, moist mucus membranes.  Trachea midline, no masses. Cardiovascular: No clubbing, cyanosis, or edema. Respiratory: Normal respiratory effort, no increased work of breathing. GI: Abdomen is soft, nontender, nondistended, no abdominal masses GU: No CVA tenderness.  Lymph: No cervical or inguinal lymphadenopathy. Skin: No rashes, bruises or suspicious lesions. Neurologic: Grossly intact, no focal deficits, moving all 4 extremities. Psychiatric: Normal mood and affect.  Laboratory Data: Lab Results  Component Value Date   WBC 4.9 01/01/2023   HGB 14.5 01/01/2023   HCT 46.0 01/01/2023   MCV 84.9 01/01/2023   PLT 207 01/01/2023    Lab Results  Component Value Date   CREATININE 1.22 01/20/2023    No results found for: "PSA"  No results found for: "TESTOSTERONE"  No results found for: "HGBA1C"  Urinalysis    Component Value Date/Time   COLORURINE AMBER (A) 04/03/2022 1409   APPEARANCEUR Clear 01/15/2023 1225   LABSPEC 1.025 04/03/2022 1409   PHURINE 5.0 04/03/2022 1409   GLUCOSEU 3+ (A) 01/15/2023 1225   HGBUR LARGE (A) 04/03/2022 1409   BILIRUBINUR Negative 01/15/2023 1225   KETONESUR NEGATIVE 04/03/2022 1409   PROTEINUR 1+ (A) 01/15/2023 1225   PROTEINUR >=300 (A) 04/03/2022 1409   NITRITE Negative 01/15/2023 1225   NITRITE NEGATIVE 04/03/2022 1409   LEUKOCYTESUR Negative 01/15/2023 1225   LEUKOCYTESUR MODERATE (A) 04/03/2022 1409    Lab Results  Component Value Date   LABMICR See below: 01/15/2023    WBCUA 0-5 01/15/2023   LABEPIT 0-10 01/15/2023   MUCUS Present 04/23/2022   BACTERIA None seen 01/15/2023    Pertinent Imaging:  Results for orders placed during the hospital encounter of 10/05/22  Abdomen 1 view (KUB)  Narrative CLINICAL DATA:  Nephrolithiasis.  EXAM: ABDOMEN - 1 VIEW  COMPARISON:  08/25/2022.  FINDINGS: The bowel gas pattern is normal. No renal or ureteral calculus is seen bilaterally. Degenerative changes in the thoracolumbar spine.  IMPRESSION: No renal or ureteral calculus.   Electronically Signed By: Thornell Sartorius M.D. On: 10/06/2022 02:10  No results found for this or any previous visit.  No results found for this or any previous visit.  No results found for this or any previous visit.  Results for orders placed during the hospital encounter of 08/06/22  US RENAL  Narrative CLINICAL DATA:  Follow-up kidney stones after ESWL.  EXAM: RENAL / URINARY TRACT  ULTRASOUND COMPLETE  COMPARISON:  CT scan of the abdomen and pelvis without contrast April 03, 2022  FINDINGS: Right Kidney:  Renal measurements: 13.9 x 5.6 x 6.9 cm = volume: 281 mL. Echogenicity within normal limits. No mass or hydronephrosis visualized.  Left Kidney:  Renal measurements: 13.8 x 6.8 x 6.1 cm = volume: 3 L1 mL. There is a shadowing mildly echogenic region identified in the mid to lower left kidney measuring up to 16 mm suggestive of a nonobstructive stone. Contains 2 adjacent cysts measuring 2.4 and 2.0 cm. No follow-up imaging recommended for the cysts.  Bladder:  Appears normal for degree of bladder distention.  Other:  None.  IMPRESSION: 1. 16 mm nonobstructive stone in the mid to lower left kidney. 2. No other significant abnormalities.   Electronically Signed By: Gerome Sam III M.D. On: 08/07/2022 13:40  No valid procedures specified. No results found for this or any previous visit.  Results for orders placed during the hospital  encounter of 01/05/23  CT RENAL STONE STUDY  Narrative CLINICAL DATA:  Follow-up renal calculi post treatment (ESWL).  EXAM: CT ABDOMEN AND PELVIS WITHOUT CONTRAST  TECHNIQUE: Multidetector CT imaging of the abdomen and pelvis was performed following the standard protocol without IV contrast.  RADIATION DOSE REDUCTION: This exam was performed according to the departmental dose-optimization program which includes automated exposure control, adjustment of the mA and/or kV according to patient size and/or use of iterative reconstruction technique.  COMPARISON:  04/03/2022  FINDINGS: Lower chest: The lung bases are clear of acute process. No pleural effusion or pulmonary lesions. Moderate eventration of the right hemidiaphragm with overlying vascular crowding and streaky atelectasis. The heart is normal in size. No pericardial effusion. The distal esophagus and aorta are unremarkable.  Hepatobiliary: No hepatic lesions or intrahepatic biliary dilatation. Gallbladder is grossly normal. No common bile duct dilatation.  Pancreas: No mass, inflammation or ductal dilatation.  Spleen: Normal size.  No focal lesions.  Adrenals/Urinary Tract: The adrenal glands are normal.  The interpolar left renal calculus is no longer identified. No small stone fragments. No right-sided renal calculi.  No left-sided hydroureteronephrosis but there are nonobstructing stone fragments in the distal left ureter. Suspect 3 or 4 stone fragments. The stone fragment in the UVJ measures approximately 6 mm. There are 2 smaller more proximal calculi and a larger most proximal calculus measuring 6 mm.  No bladder calculi.  Stomach/Bowel: The stomach, duodenum, small bowel and colon grossly normal. The terminal ileum and appendix are normal. There is scattered colonic diverticulosis but no findings for acute diverticulitis.  Vascular/Lymphatic: Normal caliber abdominal aorta. Minimal scattered  atherosclerotic calcifications. No mesenteric or retroperitoneal mass or adenopathy.  Reproductive: The prostate gland and seminal vesicles are unremarkable. Suspect prior TURP.  Other: No pelvic mass or adenopathy. No free pelvic fluid collections. No inguinal mass or adenopathy. Small periumbilical abdominal wall hernia containing fat and supraumbilical abdominal wall hernia containing fat. This is located approximately 8 cm above the umbilicus.  Musculoskeletal: No significant bony findings. Stable degenerative changes involving the spine.  IMPRESSION: 1. The interpolar left renal calculus is no longer identified. No residual small stone fragments in the kidney. 2. There are 3 or 4 nonobstructing stone fragments in the distal left ureter. The stone fragment in the UVJ measures approximately 6 mm. There are 2 smaller more proximal calculi and a larger most proximal calculus measuring 6 mm. 3. No right-sided renal or ureteral calculi. 4. No acute abdominal/pelvic findings, mass lesions or  adenopathy. 5. Small periumbilical abdominal wall hernia containing fat and supraumbilical abdominal wall hernia containing fat.   Electronically Signed By: Rudie Meyer M.D. On: 01/08/2023 09:57   Assessment & Plan:    1. Kidney stone Followup 6 weeks with renal US - Urinalysis, Routine w reflex microscopic  2. Balanitis -clotrimazole BID    No follow-ups on file.  Wilkie Aye, MD  [facility] [place]

## 2023-03-03 NOTE — Patient Instructions (Signed)
Balanitis  Balanitis is swelling and irritation of the head of the penis (glans penis). Balanitis occurs most often among males who have not had their foreskin removed (uncircumcised). In uncircumcised males, the condition may also cause inflammation of the skin around the foreskin. Balanitis sometimes causes scarring of the penis or foreskin, which can require surgery. This condition may develop because of an infection or another medical condition. Untreated balanitis can increase the risk of penile cancer. What are the causes? Common causes of this condition include: Irritation and lack of airflow due to fluid (smegma) that can build up on the glans penis. Poor personal hygiene, especially in uncircumcised males. Not cleaning the glans penis and foreskin well can result in a buildup of bacteria, viruses, and yeast, which can lead to infection and inflammation. Other causes include: Chemical irritation from products such as soaps or shower gels, especially those that have fragrance. Chemical irritation can also be caused by condoms, personal lubricants, petroleum jelly, spermicides, fabric softeners, or laundry detergents. Skin conditions, such as eczema, dermatitis, and psoriasis. Allergies to medicines, such as tetracycline and sulfa drugs. What increases the risk? The following factors may make you more likely to develop this condition: Being an uncircumcised male. Having diabetes. Having other medical conditions, including liver cirrhosis, congestive heart failure, or kidney disease. Having infections, such as candidiasis, HPV (human papillomavirus), herpes simplex, gonorrhea, or syphilis. Having a tight foreskin that is difficult to pull back (retract) past the glans penis. Being severely obese. History of reactive arthritis. What are the signs or symptoms? Symptoms of this condition include: Discharge from under the foreskin, and pain or difficulty retracting the foreskin. A bad smell  or itchiness on the penis. Tenderness, redness, and swelling of the glans penis. A rash or sores on the glans penis or foreskin. Inability to get an erection due to pain. Trouble urinating. Scarring of the penis or foreskin, in some cases. How is this diagnosed? This condition may be diagnosed based on a physical exam and tests of a swab of discharge to check for bacterial or fungal infection. You may also have blood tests to check for: Viruses that can cause balanitis. A high blood sugar (glucose) level. This could be a sign of diabetes, which can increase the risk of balanitis. How is this treated? Treatment for this condition depends on the cause. Treatment may include: Improving personal hygiene. Your health care provider may recommend sitting in a bath of warm water that is deep enough to cover your hips and buttocks (sitz bath). Medicines such as: Creams or ointments to reduce swelling (steroids) or to treat an infection. Antibiotic medicine. Antifungal medicine. Having surgery to remove or cut the foreskin (circumcision). This may be done if you have scarring on the foreskin that makes it difficult to retract. Controlling other medical problems that may be causing your condition or making it worse. Follow these instructions at home: Medicines Take over-the-counter and prescription medicines only as told by your health care provider. If you were prescribed an antibiotic medicine, use it as told by your health care provider. Do not stop using the antibiotic even if you start to feel better. General instructions Do not have sex until the condition clears up, or until your health care provider approves. Keep your penis clean and dry. Take sitz baths as recommended by your health care provider. Avoid products that irritate your skin or make symptoms worse, such as soaps and shower gels that have fragrance. Keep all follow-up visits. This is   important. Contact a health care provider  if: Your symptoms get worse or do not improve with home care. You develop chills or a fever. You have trouble urinating. You cannot retract your foreskin. Get help right away if: You develop severe pain. You are unable to urinate. Summary Balanitis is swelling and irritation of the head of the penis (glans penis). This condition is most common among uncircumcised males. Balanitis causes pain, redness, and swelling of the glans penis. Good personal hygiene is important. Treatment may include improving personal hygiene and applying creams or ointments. Contact a health care provider if your symptoms get worse or do not improve with home care. This information is not intended to replace advice given to you by your health care provider. Make sure you discuss any questions you have with your health care provider. Document Revised: 01/22/2021 Document Reviewed: 01/22/2021 Elsevier Patient Education  2024 [employer]  

## 2023-03-04 DIAGNOSIS — H401111 Primary open-angle glaucoma, right eye, mild stage: Secondary | ICD-10-CM | POA: Diagnosis not present

## 2023-03-04 LAB — URINALYSIS, ROUTINE W REFLEX MICROSCOPIC
Bilirubin, UA: NEGATIVE
Ketones, UA: NEGATIVE
Nitrite, UA: NEGATIVE
Specific Gravity, UA: 1.015 (ref 1.005–1.030)
Urobilinogen, Ur: 0.2 mg/dL (ref 0.2–1.0)
pH, UA: 5.5 (ref 5.0–7.5)

## 2023-03-04 LAB — MICROSCOPIC EXAMINATION: RBC, Urine: >30 /hpf — AB (ref 0–2)

## 2023-03-12 ENCOUNTER — Telehealth: Payer: Self-pay | Admitting: Cardiovascular Disease

## 2023-03-12 NOTE — Telephone Encounter (Signed)
Pt called in stating he was told to c/b in a few weeks if he hasn't heard from [facility] about getting his CPAP machine. Please advise.

## 2023-03-18 NOTE — Telephone Encounter (Signed)
Pt following up on the phone call from 7/19 regarding his CPAP machine. Please advise.

## 2023-03-24 DIAGNOSIS — I10 Essential (primary) hypertension: Secondary | ICD-10-CM | POA: Diagnosis not present

## 2023-03-30 DIAGNOSIS — I429 Cardiomyopathy, unspecified: Secondary | ICD-10-CM | POA: Diagnosis not present

## 2023-03-30 DIAGNOSIS — Z299 Encounter for prophylactic measures, unspecified: Secondary | ICD-10-CM | POA: Diagnosis not present

## 2023-03-30 DIAGNOSIS — I4891 Unspecified atrial fibrillation: Secondary | ICD-10-CM | POA: Diagnosis not present

## 2023-03-30 DIAGNOSIS — D6869 Other thrombophilia: Secondary | ICD-10-CM | POA: Diagnosis not present

## 2023-03-30 DIAGNOSIS — I10 Essential (primary) hypertension: Secondary | ICD-10-CM | POA: Diagnosis not present

## 2023-03-30 DIAGNOSIS — M542 Cervicalgia: Secondary | ICD-10-CM | POA: Diagnosis not present

## 2023-04-08 DIAGNOSIS — M25561 Pain in right knee: Secondary | ICD-10-CM | POA: Diagnosis not present

## 2023-04-08 DIAGNOSIS — M25562 Pain in left knee: Secondary | ICD-10-CM | POA: Diagnosis not present

## 2023-04-08 DIAGNOSIS — M170 Bilateral primary osteoarthritis of knee: Secondary | ICD-10-CM | POA: Diagnosis not present

## 2023-04-08 DIAGNOSIS — R262 Difficulty in walking, not elsewhere classified: Secondary | ICD-10-CM | POA: Diagnosis not present

## 2023-04-09 ENCOUNTER — Ambulatory Visit (HOSPITAL_COMMUNITY): Admission: RE | Admit: 2023-04-09 | Payer: Medicare Other | Source: Ambulatory Visit

## 2023-04-09 DIAGNOSIS — N201 Calculus of ureter: Secondary | ICD-10-CM | POA: Diagnosis not present

## 2023-04-09 DIAGNOSIS — N281 Cyst of kidney, acquired: Secondary | ICD-10-CM | POA: Diagnosis not present

## 2023-04-09 DIAGNOSIS — N200 Calculus of kidney: Secondary | ICD-10-CM | POA: Diagnosis not present

## 2023-04-16 ENCOUNTER — Ambulatory Visit (INDEPENDENT_AMBULATORY_CARE_PROVIDER_SITE_OTHER): Payer: Medicare Other | Admitting: Urology

## 2023-04-16 VITALS — BP 111/74 | HR 57

## 2023-04-16 DIAGNOSIS — M25562 Pain in left knee: Secondary | ICD-10-CM | POA: Diagnosis not present

## 2023-04-16 DIAGNOSIS — N200 Calculus of kidney: Secondary | ICD-10-CM | POA: Diagnosis not present

## 2023-04-16 DIAGNOSIS — M170 Bilateral primary osteoarthritis of knee: Secondary | ICD-10-CM | POA: Diagnosis not present

## 2023-04-16 DIAGNOSIS — M25561 Pain in right knee: Secondary | ICD-10-CM | POA: Diagnosis not present

## 2023-04-16 DIAGNOSIS — R262 Difficulty in walking, not elsewhere classified: Secondary | ICD-10-CM | POA: Diagnosis not present

## 2023-04-16 NOTE — Progress Notes (Unsigned)
04/16/2023 9:44 AM   Robert Warren 28-May-1946 [id number]  Referring provider: Ignatius Specking, MD [address] [place],  [place] [address]  No chief complaint on file.   HPI: He passed a clot 2-3 days after he removed his stent. No Warren pain.    PMH: Past Medical History:  Diagnosis Date   Anxiety    Atrial fibrillation (HCC)    Hypertension    Shortness of breath    Sleep apnea     Surgical History: Past Surgical History:  Procedure Laterality Date   Anal cyst Removal     COLONOSCOPY N/A 06/20/2014   Procedure: COLONOSCOPY;  Surgeon: Malissa Hippo, MD;  [facility]: [facility];  Service: Endoscopy;  Laterality: N/A;  1030   COLONOSCOPY WITH PROPOFOL N/A 03/14/2021   Procedure: COLONOSCOPY WITH PROPOFOL;  Surgeon: Dolores Frame, MD;  Location: [facility];  Service: Gastroenterology;  Laterality: N/A;  10:50   CYSTOSCOPY     CYSTOSCOPY WITH RETROGRADE PYELOGRAM, URETEROSCOPY AND STENT PLACEMENT Left 02/18/2023   Procedure: CYSTOSCOPY WITH RETROGRADE PYELOGRAM, URETEROSCOPY AND STENT PLACEMENT;  Surgeon: Malen Gauze, MD;  Location: [facility];  Service: Urology;  Laterality: Left;   EXTRACORPOREAL SHOCK WAVE LITHOTRIPSY Left 08/04/2022   Procedure: EXTRACORPOREAL SHOCK WAVE LITHOTRIPSY (ESWL);  Surgeon: Malen Gauze, MD;  Location: [facility];  Service: Urology;  Laterality: Left;   GREEN LIGHT LASER TURP (TRANSURETHRAL RESECTION OF PROSTATE     HOLMIUM LASER APPLICATION Left 02/18/2023   Procedure: HOLMIUM LASER APPLICATION;  Surgeon: Malen Gauze, MD;  Location: [facility];  Service: Urology;  Laterality: Left;   POLYPECTOMY  03/14/2021   Procedure: POLYPECTOMY;  Surgeon: Dolores Frame, MD;  [facility];  Service: Gastroenterology;;   RIGHT/LEFT HEART CATH AND CORONARY ANGIOGRAPHY N/A 12/31/2022   Procedure: RIGHT/LEFT HEART CATH AND CORONARY ANGIOGRAPHY;  Surgeon: Kathleene Hazel, MD;  [facility]: [facility];   Service: Cardiovascular;  Laterality: N/A;    Home Medications:  Allergies as of 04/16/2023   No Known Allergies      Medication List        Accurate as of April 16, 2023  9:44 AM. If you have any questions, ask your nurse or doctor.          ARIPiprazole 15 MG tablet Commonly known as: ABILIFY Take 15 mg by mouth daily.   buPROPion 200 MG 12 hr tablet Commonly known as: WELLBUTRIN SR Take 200 mg by mouth daily as needed (Depression).   clotrimazole-betamethasone cream Commonly known as: LOTRISONE Apply 1 Application topically 2 (two) times daily.   diclofenac Sodium 1 % Gel Commonly known as: VOLTAREN Apply 2 g topically daily as needed (pain).   Eliquis 5 MG Tabs tablet Generic drug: apixaban Take 5 mg by mouth 2 (two) times daily.   empagliflozin 10 MG Tabs tablet Commonly known as: JARDIANCE Take 1 tablet (10 mg total) by mouth daily.   furosemide 40 MG tablet Commonly known as: LASIX Take 1 tablet (40 mg total) by mouth daily.   HYDROcodone-acetaminophen 5-325 MG tablet Commonly known as: Norco Take 1 tablet by mouth every 6 (six) hours as needed for moderate pain.   latanoprost 0.005 % ophthalmic solution Commonly known as: XALATAN Place 1 drop into both eyes at bedtime.   losartan 50 MG tablet Commonly known as: COZAAR Take 1 tablet (50 mg total) by mouth 2 (two) times daily.   metoprolol succinate 25 MG 24 hr tablet Commonly  known as: TOPROL-XL Take 1 tablet (25 mg total) by mouth daily.   rosuvastatin 40 MG tablet Commonly known as: CRESTOR Take 40 mg by mouth daily.   spironolactone 25 MG tablet Commonly known as: Aldactone Take 1/2 tablet (12.5 mg total) by mouth daily. What changed: how much to take        Allergies: No Known Allergies  Family History: Family History  Problem Relation Age of Onset   Heart disease Mother    Cancer - Lung Father    Heart attack Father     Social History:  reports that he quit smoking  about 26 years ago. His smoking use included cigarettes. He started smoking about 56 years ago. He has a 30 pack-year smoking history. He has never used smokeless tobacco. He reports current alcohol use. He reports that he does not use drugs.  ROS: All other review of systems were reviewed and are negative except what is noted above in HPI  Physical Exam: BP 111/74   Pulse (!) 57   Constitutional:  Alert and oriented, No acute distress. HEENT: [place] AT, moist mucus membranes.  Trachea midline, no masses. Cardiovascular: No clubbing, cyanosis, or edema. Respiratory: Normal respiratory effort, no increased work of breathing. GI: Abdomen is soft, nontender, nondistended, no abdominal masses GU: No CVA tenderness.  Lymph: No cervical or inguinal lymphadenopathy. Skin: No rashes, bruises or suspicious lesions. Neurologic: Grossly intact, no focal deficits, moving all 4 extremities. Psychiatric: Normal mood and affect.  Laboratory Data: Lab Results  Component Value Date   WBC 4.9 01/01/2023   HGB 14.5 01/01/2023   HCT 46.0 01/01/2023   MCV 84.9 01/01/2023   PLT 207 01/01/2023    Lab Results  Component Value Date   CREATININE 1.22 01/20/2023    No results found for: "PSA"  No results found for: "TESTOSTERONE"  No results found for: "HGBA1C"  Urinalysis    Component Value Date/Time   COLORURINE AMBER (A) 04/03/2022 1409   APPEARANCEUR Clear 03/03/2023 1510   LABSPEC 1.025 04/03/2022 1409   PHURINE 5.0 04/03/2022 1409   GLUCOSEU 3+ (A) 03/03/2023 1510   HGBUR LARGE (A) 04/03/2022 1409   BILIRUBINUR Negative 03/03/2023 1510   KETONESUR NEGATIVE 04/03/2022 1409   PROTEINUR 1+ (A) 03/03/2023 1510   PROTEINUR >=300 (A) 04/03/2022 1409   NITRITE Negative 03/03/2023 1510   NITRITE NEGATIVE 04/03/2022 1409   LEUKOCYTESUR 1+ (A) 03/03/2023 1510   LEUKOCYTESUR MODERATE (A) 04/03/2022 1409    Lab Results  Component Value Date   LABMICR See below: 03/03/2023   WBCUA 11-30 (A)  03/03/2023   LABEPIT 0-10 03/03/2023   MUCUS Present 04/23/2022   BACTERIA Moderate (A) 03/03/2023    Pertinent Imaging: *** Results for orders placed during the hospital encounter of 10/05/22  Abdomen 1 view (KUB)  Narrative CLINICAL DATA:  Nephrolithiasis.  EXAM: ABDOMEN - 1 VIEW  COMPARISON:  08/25/2022.  FINDINGS: The bowel gas pattern is normal. No renal or ureteral calculus is seen bilaterally. Degenerative changes in the thoracolumbar spine.  IMPRESSION: No renal or ureteral calculus.   Electronically Signed By: Thornell Sartorius M.D. On: 10/06/2022 02:10  No results found for this or any previous visit.  No results found for this or any previous visit.  No results found for this or any previous visit.  Results for orders placed during the hospital encounter of 04/09/23  Ultrasound renal complete  Narrative CLINICAL DATA:  Nephrolithiasis.  EXAM: RENAL / URINARY TRACT ULTRASOUND COMPLETE  COMPARISON:  August 06, 2022, CT abdomen pelvis Jan 05, 2023  FINDINGS: Right Kidney:  Renal measurements: 13.7 x 6.8 x 5.6 cm = volume: 275.7 mL. Echogenicity within normal limits. No mass or hydronephrosis visualized.  Left Kidney:  Renal measurements: 14.3 x 7.2 x 5.7 cm = volume: 306.6 mL. Echogenicity within normal limits. No hydronephrosis visualized. Simple cysts are identified within the lower pole left kidney, largest measures 2.9 x 2.1 x 2.5 cm. No follow-up is recommended.  Bladder:  Appears normal for degree of bladder distention. The CT noted calculi in the left ureteral vesicular junction is not definitely documented on the ultrasound. Bilateral ureteral jets are noted.  Other:  None.  IMPRESSION: 1. No acute abnormality identified. 2. The CT noted calculi in the left ureteral vesicular junction is not definitely documented on the ultrasound. Bilateral ureteral jets are noted.   Electronically Signed By: Sherian Rein M.D. On:  04/09/2023 10:52  No valid procedures specified. No results found for this or any previous visit.  Results for orders placed during the hospital encounter of 01/05/23  CT RENAL STONE STUDY  Narrative CLINICAL DATA:  Follow-up renal calculi post treatment (ESWL).  EXAM: CT ABDOMEN AND PELVIS WITHOUT CONTRAST  TECHNIQUE: Multidetector CT imaging of the abdomen and pelvis was performed following the standard protocol without IV contrast.  RADIATION DOSE REDUCTION: This exam was performed according to the departmental dose-optimization program which includes automated exposure control, adjustment of the mA and/or kV according to patient size and/or use of iterative reconstruction technique.  COMPARISON:  04/03/2022  FINDINGS: Lower chest: The lung bases are clear of acute process. No pleural effusion or pulmonary lesions. Moderate eventration of the right hemidiaphragm with overlying vascular crowding and streaky atelectasis. The heart is normal in size. No pericardial effusion. The distal esophagus and aorta are unremarkable.  Hepatobiliary: No hepatic lesions or intrahepatic biliary dilatation. Gallbladder is grossly normal. No common bile duct dilatation.  Pancreas: No mass, inflammation or ductal dilatation.  Spleen: Normal size.  No focal lesions.  Adrenals/Urinary Tract: The adrenal glands are normal.  The interpolar left renal calculus is no longer identified. No small stone fragments. No right-sided renal calculi.  No left-sided hydroureteronephrosis but there are nonobstructing stone fragments in the distal left ureter. Suspect 3 or 4 stone fragments. The stone fragment in the UVJ measures approximately 6 mm. There are 2 smaller more proximal calculi and a larger most proximal calculus measuring 6 mm.  No bladder calculi.  Stomach/Bowel: The stomach, duodenum, small bowel and colon grossly normal. The terminal ileum and appendix are normal. There  is scattered colonic diverticulosis but no findings for acute diverticulitis.  Vascular/Lymphatic: Normal caliber abdominal aorta. Minimal scattered atherosclerotic calcifications. No mesenteric or retroperitoneal mass or adenopathy.  Reproductive: The prostate gland and seminal vesicles are unremarkable. Suspect prior TURP.  Other: No pelvic mass or adenopathy. No free pelvic fluid collections. No inguinal mass or adenopathy. Small periumbilical abdominal wall hernia containing fat and supraumbilical abdominal wall hernia containing fat. This is located approximately 8 cm above the umbilicus.  Musculoskeletal: No significant bony findings. Stable degenerative changes involving the spine.  IMPRESSION: 1. The interpolar left renal calculus is no longer identified. No residual small stone fragments in the kidney. 2. There are 3 or 4 nonobstructing stone fragments in the distal left ureter. The stone fragment in the UVJ measures approximately 6 mm. There are 2 smaller more proximal calculi and a larger most proximal calculus measuring 6 mm. 3. No right-sided renal  or ureteral calculi. 4. No acute abdominal/pelvic findings, mass lesions or adenopathy. 5. Small periumbilical abdominal wall hernia containing fat and supraumbilical abdominal wall hernia containing fat.   Electronically Signed By: Rudie Meyer M.D. On: 01/08/2023 09:57   Assessment & Plan:    1. Kidney stone Followup 6 months with renal US - Urinalysis, Routine w reflex microscopic   No follow-ups on file.  Wilkie Aye, MD  [facility] [place]

## 2023-04-21 ENCOUNTER — Encounter: Payer: Self-pay | Admitting: Urology

## 2023-04-21 NOTE — Patient Instructions (Signed)

## 2023-04-22 ENCOUNTER — Ambulatory Visit (HOSPITAL_COMMUNITY): Payer: Medicare Other | Attending: Physician Assistant

## 2023-04-22 DIAGNOSIS — I480 Paroxysmal atrial fibrillation: Secondary | ICD-10-CM | POA: Diagnosis not present

## 2023-04-22 DIAGNOSIS — I493 Ventricular premature depolarization: Secondary | ICD-10-CM | POA: Insufficient documentation

## 2023-04-22 DIAGNOSIS — I2510 Atherosclerotic heart disease of native coronary artery without angina pectoris: Secondary | ICD-10-CM | POA: Insufficient documentation

## 2023-04-22 DIAGNOSIS — I5022 Chronic systolic (congestive) heart failure: Secondary | ICD-10-CM | POA: Diagnosis not present

## 2023-04-22 LAB — ECHOCARDIOGRAM COMPLETE
AR max vel: 3.45 cm2
AV Area VTI: 3.71 cm2
AV Area mean vel: 3.37 cm2
AV Mean grad: 5 mmHg
AV Peak grad: 9.7 mmHg
Ao pk vel: 1.56 m/s
Area-P 1/2: 1.75 cm2
S' Lateral: 3.3 cm

## 2023-04-23 DIAGNOSIS — R262 Difficulty in walking, not elsewhere classified: Secondary | ICD-10-CM | POA: Diagnosis not present

## 2023-04-23 DIAGNOSIS — M25562 Pain in left knee: Secondary | ICD-10-CM | POA: Diagnosis not present

## 2023-04-23 DIAGNOSIS — M25561 Pain in right knee: Secondary | ICD-10-CM | POA: Diagnosis not present

## 2023-04-23 DIAGNOSIS — M170 Bilateral primary osteoarthritis of knee: Secondary | ICD-10-CM | POA: Diagnosis not present

## 2023-04-24 DIAGNOSIS — I10 Essential (primary) hypertension: Secondary | ICD-10-CM | POA: Diagnosis not present

## 2023-04-27 ENCOUNTER — Telehealth: Payer: Self-pay

## 2023-04-27 DIAGNOSIS — I77810 Thoracic aortic ectasia: Secondary | ICD-10-CM | POA: Insufficient documentation

## 2023-04-27 NOTE — Progress Notes (Signed)
Cardiology Office Note   Date:  04/28/2023  ID:  Robert Warren, DOB 1945/10/29, MRN [id number] PCP:  Ignatius Specking, MD [facility] Cardiologist: Thurmon Fair, MD  Reason for visit: 3 month follow-up  History of Present Illness    Robert Warren is a 77 y.o. male with a hx of PAF, HTN, OSA and frequent PVCs.  He was seen by Dr. Royann Shivers in 09/2022 for dyspnea.  Noted to have freq PVCs.  His verapamil was discontinued and he was started on metoprolol 50 mg daily.  Was recommended to overall be on a lower dose given a history of junctional bradycardia on higher doses of AV nodal blocking agents.    He was admitted 5/24 for acute systolic heart failure. Echo showed newly reduced LVEF, 30-35%, severely dilated LV, mild LVH, GIDD and mildly reduced RV, only trivial MR (last echo in 2023 showed normal EF 50-55%). Subsequent LHC showed mild nonobstructive CAD. Discharge wt after diuresis: 341 lb. I last saw him in January 28, 2023.  Patient weight was stable around 340 pounds.  Appeared euvolemic on exam.  Only felt short of breath if doing strenuous work or in a hurry.  Today, patient states he feels well.  His weight at home has been 335 to 340 pounds.  He denies lower extremity edema.  He denies palpitations but sometimes has a funny feeling in his chest that short-lived.  He denies chest pain.  He has some shortness of breath with exertion he blames on deconditioning and obesity.  He states Dr. Tresa Endo mention transitioning him to BiPAP but he does not have this device yet.  Objective / Physical Exam   Vital signs:  BP 120/74 (BP Location: Left Arm, Patient Position: Sitting, Cuff Size: Large)   Pulse (!) 48   Ht 6\' 4"  (1.93 m)   Wt (!) 347 lb 9.6 oz (157.7 kg)   SpO2 93%   BMI 42.31 kg/m     GEN: No acute distress, obese NECK: No carotid bruits CARDIAC: RRR, no murmurs RESPIRATORY:  Clear to auscultation without rales, wheezing or rhonchi  EXTREMITIES: No edema  Assessment and  Plan   Chronic systolic CHF, euvolemic -NICM. Echo 5/24 LVEF, 30-35%, severely dilated LV, mild LVH, GIDD and mildly reduced RV, only trivial MR (last echo in 2023 showed normal EF 50-55%). Subsequent LHC showed mild nonobstructive CAD.  -To r/o arrhythmia as cause of heart failure, patient wore ZIO monitor in June 2024 which showed 3.6% PVC burden, no evidence of high-grade AV block, sustained VT or A-fib. -GDMT prev limited by soft BP  -Repeat echo 03/2023: EF 45-50% -Continue Lasix 40 mg daily.  He can take extra tablet as needed for weight gain over 3 pounds in 1 day or 5 pounds 1 week. -Continue Jardiance 12.5 mg daily (was prescribed 25 mg half tablet daily by the [place] as this was the only dose covered by insurance) -Continue losartan 50 mg twice daily.  Entresto co-pay cost prohibitive okay -Continue Toprol XL 25 mg daily -dose limited by heart rate. -Continue spironolactone 12.5 mg daily. -Check BMET today. -Refer to cardiac rehab for cardiac/diet education and help with conditioning and weight loss.  Dilation of ascending aorta -Echo 03/2023: Mild dilation of ascending aorta noted at 42 mm  -Repeat echo in 1 year -BP control   PVCs, overall asymptomatic -Zio monitor 02/2023: 3.6% PVC burden -Continue Toprol   PAF -No a-fib on monitor 02/2023 -Continue Toprol and Eliquis.     Coronary  artery disease, no angina -LHC 12/2022: mild CAD  -Continue statin therapy.  Not on aspirin given need for anticoagulation.  Disposition - Follow-up in 4 months.   Signed, Cannon Kettle, PA-C  04/28/2023 [facility]

## 2023-04-27 NOTE — Telephone Encounter (Addendum)
Called patient regarding results. Left message for patient to call office.----- Message from Cannon Kettle sent at 04/27/2023  9:38 AM EDT ----- Excellent news -ejection fraction has improved from 30 to 35% to now 45 to 50%. Otherwise, normal right ventricular function, no significant valve disease. Mild dilation of ascending aorta noted at 42 mm.   Recommendations  -Continue medical therapy to support heart function.   -Recheck 2D echo in 1 year to follow aortic dilation.

## 2023-04-28 ENCOUNTER — Encounter: Payer: Self-pay | Admitting: Physician Assistant

## 2023-04-28 ENCOUNTER — Ambulatory Visit: Payer: Medicare Other | Attending: Physician Assistant | Admitting: Physician Assistant

## 2023-04-28 VITALS — BP 120/74 | HR 48 | Ht 76.0 in | Wt 347.6 lb

## 2023-04-28 DIAGNOSIS — I493 Ventricular premature depolarization: Secondary | ICD-10-CM | POA: Insufficient documentation

## 2023-04-28 DIAGNOSIS — I77810 Thoracic aortic ectasia: Secondary | ICD-10-CM | POA: Diagnosis not present

## 2023-04-28 DIAGNOSIS — I5022 Chronic systolic (congestive) heart failure: Secondary | ICD-10-CM | POA: Diagnosis not present

## 2023-04-28 LAB — BASIC METABOLIC PANEL
BUN/Creatinine Ratio: 12 (ref 10–24)
BUN: 13 mg/dL (ref 8–27)
CO2: 28 mmol/L (ref 20–29)
Calcium: 8.9 mg/dL (ref 8.6–10.2)
Chloride: 105 mmol/L (ref 96–106)
Creatinine, Ser: 1.08 mg/dL (ref 0.76–1.27)
Glucose: 79 mg/dL (ref 70–99)
Potassium: 4.4 mmol/L (ref 3.5–5.2)
Sodium: 143 mmol/L (ref 134–144)
eGFR: 72 mL/min/{1.73_m2} (ref >59)

## 2023-04-28 MED ORDER — FUROSEMIDE 40 MG PO TABS: ORAL_TABLET | ORAL | 3 refills | Status: DC

## 2023-04-28 NOTE — Patient Instructions (Addendum)
Medication Instructions:  Furosemide 40 mg daily. May take an additional tablet on the days of weight gain 3 lb overnight or 5 lb in 1 week.   *If you need a refill on your cardiac medications before your next appointment, please call your pharmacy*   Lab Work: BMET today   Testing/Procedures: NONE ordered at this time of appointment     Follow-Up: At [facility], you and your health needs are our priority.  As part of our continuing mission to provide you with exceptional heart care, we have created designated Provider Care Teams.  These Care Teams include your primary Cardiologist (physician) and Advanced Practice Providers (APPs -  Physician Assistants and Nurse Practitioners) who all work together to provide you with the care you need, when you need it.  We recommend signing up for the patient portal called "MyChart".  Sign up information is provided on this After Visit Summary.  MyChart is used to connect with patients for Virtual Visits (Telemedicine).  Patients are able to view lab/test results, encounter notes, upcoming appointments, etc.  Non-urgent messages can be sent to your provider as well.   To learn more about what you can do with MyChart, go to [email or web address].    Your next appointment:   4 month(s)  Provider:   Thurmon Fair, MD  or Juanda Crumble, PA-C        Other Instructions Heart Failure Education:  Weigh yourself EVERY morning after you go to the bathroom but before you eat or drink anything. Write this number down in a weight log/diary. If you gain 3 pounds overnight or 5 pounds in a week, call the office. Take your medicines as prescribed. If you have concerns about your medications, please call us before you stop taking them. Eat low salt foods--Limit salt (sodium) to 2000 mg per day. This will help prevent your body from holding onto fluid. Read food labels as many processed foods have a lot of sodium, especially canned goods and  prepackaged meats. If you would like some assistance choosing low sodium foods, we would be happy to set you up with a nutritionist. Limit all fluids for the day to less than 2 liters (64 ounces). Fluid includes all drinks, coffee, juice, ice chips, soup, jello, and all other liquids. Stay as active as you can everyday. Staying active will give you more energy and make your muscles stronger. Start with 5 minutes at a time and work your way up to 30 minutes a day. Break up your activities--do some in the morning and some in the afternoon. Start with 3 days per week and work your way up to 5 days as you can.  If you have chest pain, feel short of breath, dizzy, or lightheaded, STOP. If you don't feel better after a short rest, call 911. If you do feel better, call the office to let us know you have symptoms with exercise.

## 2023-04-29 ENCOUNTER — Other Ambulatory Visit (HOSPITAL_COMMUNITY): Payer: Self-pay

## 2023-04-29 ENCOUNTER — Telehealth: Payer: Self-pay

## 2023-04-29 DIAGNOSIS — I5022 Chronic systolic (congestive) heart failure: Secondary | ICD-10-CM

## 2023-04-29 NOTE — Telephone Encounter (Addendum)
Called patient regarding results. Patient had understanding of results. ----- Message from Cannon Kettle sent at 04/27/2023  9:38 AM EDT ----- Excellent news -ejection fraction has improved from 30 to 35% to now 45 to 50%. Otherwise, normal right ventricular function, no significant valve disease. Mild dilation of ascending aorta noted at 42 mm.   Recommendations  -Continue medical therapy to support heart function.   -Recheck 2D echo in 1 year to follow aortic dilation.

## 2023-04-30 ENCOUNTER — Other Ambulatory Visit (HOSPITAL_COMMUNITY): Payer: Medicare Other

## 2023-04-30 DIAGNOSIS — R262 Difficulty in walking, not elsewhere classified: Secondary | ICD-10-CM | POA: Diagnosis not present

## 2023-04-30 DIAGNOSIS — M25561 Pain in right knee: Secondary | ICD-10-CM | POA: Diagnosis not present

## 2023-04-30 DIAGNOSIS — M170 Bilateral primary osteoarthritis of knee: Secondary | ICD-10-CM | POA: Diagnosis not present

## 2023-04-30 DIAGNOSIS — M25562 Pain in left knee: Secondary | ICD-10-CM | POA: Diagnosis not present

## 2023-05-05 ENCOUNTER — Telehealth: Payer: Self-pay

## 2023-05-05 ENCOUNTER — Ambulatory Visit (HOSPITAL_COMMUNITY)
Admission: RE | Admit: 2023-05-05 | Discharge: 2023-05-05 | Disposition: A | Discharge status: Home / Self Care | Payer: Medicare Other | Source: Ambulatory Visit | Attending: Cardiovascular Disease | Admitting: Cardiovascular Disease

## 2023-05-05 DIAGNOSIS — I5022 Chronic systolic (congestive) heart failure: Secondary | ICD-10-CM | POA: Insufficient documentation

## 2023-05-05 NOTE — Telephone Encounter (Addendum)
Called patient regarding results. Patient had understanding of results.----- Message from Cannon Kettle sent at 05/03/2023 10:29 PM EDT ----- Normal kidney function and electrolytes.

## 2023-05-07 DIAGNOSIS — M25562 Pain in left knee: Secondary | ICD-10-CM | POA: Diagnosis not present

## 2023-05-07 DIAGNOSIS — R262 Difficulty in walking, not elsewhere classified: Secondary | ICD-10-CM | POA: Diagnosis not present

## 2023-05-07 DIAGNOSIS — M170 Bilateral primary osteoarthritis of knee: Secondary | ICD-10-CM | POA: Diagnosis not present

## 2023-05-07 DIAGNOSIS — M25561 Pain in right knee: Secondary | ICD-10-CM | POA: Diagnosis not present

## 2023-05-11 ENCOUNTER — Encounter (HOSPITAL_COMMUNITY): Payer: Medicare Other

## 2023-05-20 DIAGNOSIS — R21 Rash and other nonspecific skin eruption: Secondary | ICD-10-CM | POA: Diagnosis not present

## 2023-05-20 DIAGNOSIS — I5022 Chronic systolic (congestive) heart failure: Secondary | ICD-10-CM | POA: Diagnosis not present

## 2023-05-20 DIAGNOSIS — I429 Cardiomyopathy, unspecified: Secondary | ICD-10-CM | POA: Diagnosis not present

## 2023-05-20 DIAGNOSIS — I10 Essential (primary) hypertension: Secondary | ICD-10-CM | POA: Diagnosis not present

## 2023-05-20 DIAGNOSIS — Z299 Encounter for prophylactic measures, unspecified: Secondary | ICD-10-CM | POA: Diagnosis not present

## 2023-05-24 DIAGNOSIS — I10 Essential (primary) hypertension: Secondary | ICD-10-CM | POA: Diagnosis not present

## 2023-05-25 ENCOUNTER — Encounter (HOSPITAL_COMMUNITY): Payer: Medicare Other

## 2023-05-26 ENCOUNTER — Encounter (HOSPITAL_COMMUNITY): Admission: RE | Admit: 2023-05-26 | Payer: Medicare Other | Source: Ambulatory Visit

## 2023-06-18 DIAGNOSIS — M170 Bilateral primary osteoarthritis of knee: Secondary | ICD-10-CM | POA: Diagnosis not present

## 2023-06-18 DIAGNOSIS — M25562 Pain in left knee: Secondary | ICD-10-CM | POA: Diagnosis not present

## 2023-06-18 DIAGNOSIS — M25561 Pain in right knee: Secondary | ICD-10-CM | POA: Diagnosis not present

## 2023-06-18 DIAGNOSIS — R262 Difficulty in walking, not elsewhere classified: Secondary | ICD-10-CM | POA: Diagnosis not present

## 2023-06-23 DIAGNOSIS — I10 Essential (primary) hypertension: Secondary | ICD-10-CM | POA: Diagnosis not present

## 2023-06-24 DIAGNOSIS — Z23 Encounter for immunization: Secondary | ICD-10-CM | POA: Diagnosis not present

## 2023-07-02 DIAGNOSIS — I5022 Chronic systolic (congestive) heart failure: Secondary | ICD-10-CM | POA: Diagnosis not present

## 2023-07-02 DIAGNOSIS — Z299 Encounter for prophylactic measures, unspecified: Secondary | ICD-10-CM | POA: Diagnosis not present

## 2023-07-02 DIAGNOSIS — I10 Essential (primary) hypertension: Secondary | ICD-10-CM | POA: Diagnosis not present

## 2023-07-02 DIAGNOSIS — R42 Dizziness and giddiness: Secondary | ICD-10-CM | POA: Diagnosis not present

## 2023-07-05 DIAGNOSIS — M170 Bilateral primary osteoarthritis of knee: Secondary | ICD-10-CM | POA: Diagnosis not present

## 2023-07-07 DIAGNOSIS — I10 Essential (primary) hypertension: Secondary | ICD-10-CM | POA: Diagnosis not present

## 2023-07-07 DIAGNOSIS — H6123 Impacted cerumen, bilateral: Secondary | ICD-10-CM | POA: Diagnosis not present

## 2023-07-07 DIAGNOSIS — Z299 Encounter for prophylactic measures, unspecified: Secondary | ICD-10-CM | POA: Diagnosis not present

## 2023-07-15 ENCOUNTER — Ambulatory Visit: Payer: Medicare Other | Attending: Cardiovascular Disease | Admitting: Cardiovascular Disease

## 2023-07-15 ENCOUNTER — Encounter (INDEPENDENT_AMBULATORY_CARE_PROVIDER_SITE_OTHER): Payer: Self-pay | Admitting: Otolaryngology

## 2023-07-15 ENCOUNTER — Encounter: Payer: Self-pay | Admitting: Cardiovascular Disease

## 2023-07-15 DIAGNOSIS — I10 Essential (primary) hypertension: Secondary | ICD-10-CM

## 2023-07-15 DIAGNOSIS — Z7901 Long term (current) use of anticoagulants: Secondary | ICD-10-CM

## 2023-07-15 DIAGNOSIS — I480 Paroxysmal atrial fibrillation: Secondary | ICD-10-CM | POA: Diagnosis not present

## 2023-07-15 DIAGNOSIS — G4739 Other sleep apnea: Secondary | ICD-10-CM | POA: Diagnosis not present

## 2023-07-15 DIAGNOSIS — Z79899 Other long term (current) drug therapy: Secondary | ICD-10-CM

## 2023-07-15 DIAGNOSIS — G4733 Obstructive sleep apnea (adult) (pediatric): Secondary | ICD-10-CM

## 2023-07-15 DIAGNOSIS — I428 Other cardiomyopathies: Secondary | ICD-10-CM | POA: Diagnosis not present

## 2023-07-15 NOTE — Patient Instructions (Addendum)
Medication Instructions:  TAKE LOSARTAN 50MG  IN THE MORNING. TAKE LOSARTAN 25MG  AT NIGHT *If you need a refill on your cardiac medications before your next appointment, please call your pharmacy*   Lab Work: NO LABS ORDERED If you have labs (blood work) drawn today and your tests are completely normal, you will receive your results only by: MyChart Message (if you have MyChart) OR A paper copy in the mail If you have any lab test that is abnormal or we need to change your treatment, we will call you to review the results.   Testing/Procedures: NO TESTING ORDERED   Follow-Up: At [facility], you and your health needs are our priority.  As part of our continuing mission to provide you with exceptional heart care, we have created designated Provider Care Teams.  These Care Teams include your primary Cardiologist (physician) and Advanced Practice Providers (APPs -  Physician Assistants and Nurse Practitioners) who all work together to provide you with the care you need, when you need it.  We recommend signing up for the patient portal called "MyChart".  Sign up information is provided on this After Visit Summary.  MyChart is used to connect with patients for Virtual Visits (Telemedicine).  Patients are able to view lab/test results, encounter notes, upcoming appointments, etc.  Non-urgent messages can be sent to your provider as well.   To learn more about what you can do with MyChart, go to [email or web address].    Your next appointment:   4 month(s)  Provider:   DR. Nicki Guadalajara     If you have any questions or concerns regarding your c-pap, bi-pap or sleep accessories, please contact Brandie Rorie at [phone].

## 2023-07-15 NOTE — Progress Notes (Signed)
Cardiology Office Note    Date:  07/25/2023   ID:  Robert Warren, DOB October 10, 1945, MRN [id number]  PCP:  Ignatius Specking, MD  Cardiologist:  Nicki Guadalajara, MD (sleep); Dr. Royann Shivers  5 month F/U sleep evaluation   History of Present Illness:  Robert Warren is a 77 y.o. male who is followed by Dr. Royann Shivers for cardiology care.  He is a [place] [employer] veteran and has a history of atrial fibrillation.  A prior echo Doppler study had shown evidence for cor pulmonale but he had normal left ventricular systolic function.  He has a history of obstructive sleep apnea and has been on CPAP therapy since  2017 His DME company is [employer].  His initial evaluation was a home study with [facility] on March 20, 2016.  Reportedly on that study his apnea index was 58.0, RDI (AHI: RDI) was 74/h.  Central apnea index was 46.8/h.  He has a ResMed air sense 10 CPAP unit with a set up date on May 05, 2016.  When the patient was recently seen by Dr. Royann Shivers on March 05, 2021, he admitted to poor sleep, nonrestorative sleep, and the need to take naps during the day.  I saw him for my initial evaluation on April 23, 2021.  Robert Warren admits that he uses CPAP every night.  He typically goes to bed around 8 PM but does not go to sleep and usually watches television or reads.  He typically falls asleep around 1030 to 11 PM.  He does not put his CPAP mask until he is about to fall asleep.  He wakes up at 4 AM somewhat restless and usually for good at 5:45 AM.  He brought his machine with him to the office today.  Last night he had used his CPAP for 5 hours and 51 minutes and his AHI was 2.2.  We were subsequently able to get a download and over the past month he has used a 29 of 30 nights and 16 out of 30 nights had usage greater than 4 hours.  His average use was 4.6 hours.  He cannot sleep on his back.  His pressure averages 13.5 cm of water.  AHI was 6.1.  Central apnea index 3.0/h.  An Epworth Sleepiness Scale  score was calculated in the office today and this endorsed at 8. He has a history of morbid obesity.  Due to a history of PAF he was on anticoagulation with apixaban.  With a history of hypertension he was on lisinopril 40 mg, verapamil 120 mg and was on rosuvastatin 40 mg for hyperlipidemia.  During his initial evaluation, blood pressure was elevated and HCTZ 12.5 mg was added to his medical regimen.  During his initial evaluation with me, I made adjustments to his CPAP unit which had been set at a pressure range previously of 5 to 20 cm.  With his 95th percentile pressure 13.5 with maximum average pressure 14.5 I changed his minimum pressure to 10 cm and continued 20 cm maximal pressure.  I also increased his ramp start pressure.  I discussed the importance of weight loss and exercise both for his cardiovascular health and effects on his severe sleep apnea.  I saw him for follow-up evaluation on October 29, 2021 at which time he felt well.  I obtained a new download from February 5 through October 27, 2021 at his 10 to 20 cm setting range.  He is compliant with usage at 100%  and average use at 6 hours and 26 minutes.  He typically goes to bed and watches television at 9 AM but it may be several hours before he puts the mask on.  AHI is 4.8.  On most days there is no mask leak and there were several days with mild mask leak.  [employer] is his DME company.  Since I saw him, his dose of the verapamil has been increased from 120 mg to 180 mg at the [place] 1 month ago.  He continues to be on lisinopril 40 mg and HCTZ 12.5 mg.  He continues to be on Eliquis for anticoagulation and is unaware of any recurrent atrial fibrillation.  He is on rosuvastatin 40 mg for hyperlipidemia.    Since I saw him, he has been evaluated by Dr. Royann Shivers.  When seen by him on June 01, 2022 he was in a bigeminal rhythm.  He was given clearance to undergo lithotripsy.  An echo Doppler study on June 09, 2022 showed an EF of 50 to 55% with mild  LVH, there was grade 1 diastolic dysfunction.  There was mildly elevated pulmonary artery systolic pressure 37 mm, mild to moderate LA dilatation and aortic valve sclerosis.  He saw Dr. Royann Shivers yesterday for follow-up evaluation and he has had some brief episodes of some shortness of breath with concern for possible PAF.  Apparently, it appears that he was taken off Eliquis at his hospitalization on 08/04/2022.  I saw him on September 29, 2022 for follow-up of his sleep apnea.  I obtained a download from January 8 through September 29, 2022.  Usage was 100% with average use at 7 hours and 37 minutes.  His CPAP AutoSet unit was set at a pressure range of 20 to 20 cm.  AHI is increased at 15.3 with central apnea index of 11.4.  His machine is from 2016 and he started therapy in 2017.  He is CPAP unit is no longer wireless.  He admits to some fatigue.  He is unaware of breakthrough snoring.  In light of his complex sleep apnea I recommended another sleep evaluation with probable need for BiPAP ST or ASV therapy.  His most recent echo Doppler study had shown an EF of 50 to 55%.  He underwent a repeat sleep study on November 30, 2022.  He ultimately was titrated with BiPAP ST to a pressure of 15 over 10 cm of water, and rate at 16.  AHI was 0, RDI 0 and O2 nadir at 94%.  There was no significant central apnea during the titration at only 2.8/h.  He will need a BiPAP ST auto device.  He is followed at the [place].  He is on losartan 50 mg twice a day, metoprolol succinate 25 mg daily, spironolactone 12.5 mg daily, is anticoagulated on Eliquis 5 mg twice a day, and was advised to initiate Jardiance 10 mg.  However since he gets his medications at the [facility] he was given a prescription Jardiance 25 mg.  He has been hesitant to take this since they told him to take half pill.    Since I last saw him, he is still not received a BiPAP ST device.  A download from his AirSense 10 AutoSet was obtained from August 23 through  July 14, 2023.  Compliance is 100% with average use at 6 hours and 51 minutes.  His CPAP is set at a pressure range of 12 to 20 cm.  AHI is 3.2 with central index  of 1.3.  95th percentile pressure was 14.0 with maximum average pressure 15.1.  He presents for evaluation.  Past Medical History:  Diagnosis Date   Anxiety    Atrial fibrillation (HCC)    Hypertension    Shortness of breath    Sleep apnea     Past Surgical History:  Procedure Laterality Date   Anal cyst Removal     COLONOSCOPY N/A 06/20/2014   Procedure: COLONOSCOPY;  Surgeon: Malissa Hippo, MD;  Location: [facility];  Service: Endoscopy;  Laterality: N/A;  1030   COLONOSCOPY WITH PROPOFOL N/A 03/14/2021   Procedure: COLONOSCOPY WITH PROPOFOL;  Surgeon: Dolores Frame, MD;  Location: [facility];  Service: Gastroenterology;  Laterality: N/A;  10:50   CYSTOSCOPY     CYSTOSCOPY WITH RETROGRADE PYELOGRAM, URETEROSCOPY AND STENT PLACEMENT Left 02/18/2023   Procedure: CYSTOSCOPY WITH RETROGRADE PYELOGRAM, URETEROSCOPY AND STENT PLACEMENT;  Surgeon: Malen Gauze, MD;  Location: [facility];  Service: Urology;  Laterality: Left;   EXTRACORPOREAL SHOCK WAVE LITHOTRIPSY Left 08/04/2022   Procedure: EXTRACORPOREAL SHOCK WAVE LITHOTRIPSY (ESWL);  Surgeon: Malen Gauze, MD;  Location: [facility];  Service: Urology;  Laterality: Left;   GREEN LIGHT LASER TURP (TRANSURETHRAL RESECTION OF PROSTATE     HOLMIUM LASER APPLICATION Left 02/18/2023   Procedure: HOLMIUM LASER APPLICATION;  Surgeon: Malen Gauze, MD;  Location: [facility];  Service: Urology;  Laterality: Left;   POLYPECTOMY  03/14/2021   Procedure: POLYPECTOMY;  Surgeon: Dolores Frame, MD;  Location: [facility];  Service: Gastroenterology;;   RIGHT/LEFT HEART CATH AND CORONARY ANGIOGRAPHY N/A 12/31/2022   Procedure: RIGHT/LEFT HEART CATH AND CORONARY ANGIOGRAPHY;  Surgeon: Kathleene Hazel, MD;  Location: [facility];  Service:  Cardiovascular;  Laterality: N/A;    Current Medications: Outpatient Medications Prior to Visit  Medication Sig Dispense Refill   apixaban (ELIQUIS) 5 MG TABS tablet Take 5 mg by mouth 2 (two) times daily.     ARIPiprazole (ABILIFY) 15 MG tablet Take 15 mg by mouth daily.     buPROPion (WELLBUTRIN SR) 200 MG 12 hr tablet Take 200 mg by mouth daily as needed (Depression).     clotrimazole-betamethasone (LOTRISONE) cream Apply 1 Application topically 2 (two) times daily. 30 g 3   diclofenac Sodium (VOLTAREN) 1 % GEL Apply 2 g topically daily as needed (pain).     empagliflozin (JARDIANCE) 25 MG TABS tablet Take 12.5 mg by mouth daily. Taking 12/5 mg daily     furosemide (LASIX) 40 MG tablet Take 40 mg daily. May take an additional tablet on the days of weight gain of 3 lb over night and 5 lb weight gain in 1 week. 180 tablet 3   latanoprost (XALATAN) 0.005 % ophthalmic solution Place 1 drop into both eyes at bedtime.     losartan (COZAAR) 50 MG tablet Take 50 mg by mouth 2 (two) times daily.     metoprolol succinate (TOPROL-XL) 25 MG 24 hr tablet Take 1 tablet (25 mg total) by mouth daily. 90 tablet 3   rosuvastatin (CRESTOR) 40 MG tablet Take 40 mg by mouth daily.     spironolactone (ALDACTONE) 25 MG tablet Take 1/2 tablet (12.5 mg total) by mouth daily. (Patient taking differently: Take 25 mg by mouth daily.) 90 tablet 3   losartan (COZAAR) 50 MG tablet Take 1 tablet (50 mg total) by mouth 2 (two) times daily. 180 tablet 2   No facility-administered medications prior to visit.  Allergies:   Patient has no known allergies.   Social History   Socioeconomic History   Marital status: Married    Spouse name: Darel Hong   Number of children: 2   Years of education: Not on file   Highest education level: High school graduate  Occupational History   Occupation: Retired  Tobacco Use   Smoking status: Former    Current packs/day: 0.00    Average packs/day: 1 pack/day for 30.0 years (30.0 ttl  pk-yrs)    Types: Cigarettes    Start date: 08/23/1966    Quit date: 08/23/1996    Years since quitting: 26.9   Smokeless tobacco: Never  Vaping Use   Vaping status: Never Used  Substance and Sexual Activity   Alcohol use: Yes    Comment: occasional   Drug use: No   Sexual activity: Yes  Other Topics Concern   Not on file  Social History Narrative   Not on file   Social Determinants of Health   Financial Resource Strain: Low Risk  (12/31/2022)   Overall Financial Resource Strain (CARDIA)    Difficulty of Paying Living Expenses: Not very hard  Food Insecurity: No Food Insecurity (12/28/2022)   Hunger Vital Sign    Worried About Running Out of Food in the Last Year: Never true    Ran Out of Food in the Last Year: Never true  Transportation Needs: No Transportation Needs (12/31/2022)   PRAPARE - Administrator, Civil Service (Medical): No    Lack of Transportation (Non-Medical): No  Physical Activity: Not on file  Stress: Not on file  Social Connections: Not on file    Additional social history is notable that he is married for 48 years.  He has 2 children and 6 grandchildren.  He previously had worked as a Museum/gallery exhibitions officer at [employer].  He smoked from age 28 and quit smoking in 1999.  Family History:  The patient's family history includes Cancer - Lung in his father; Heart attack in his father; Heart disease in his mother.  His mother died at age 2 with heart disease and father died at age 21 with cancer.  ROS General: Negative; No fevers, chills, or night sweats; morbid obesity HEENT: Negative; No changes in vision or hearing, sinus congestion, difficulty swallowing Pulmonary: Negative; No cough, wheezing, shortness of breath, hemoptysis Cardiovascular: Positive for hypertension, PAF GI: Negative; No nausea, vomiting, diarrhea, or abdominal pain GU: Negative; No dysuria, hematuria, or difficulty voiding Musculoskeletal: Negative; no myalgias, joint pain, or  weakness Hematologic/Oncology: Negative; no easy bruising, bleeding Endocrine: Negative; no heat/cold intolerance; no diabetes Neuro: Negative; no changes in balance, headaches Skin: Negative; No rashes or skin lesions Psychiatric: Negative; No behavioral problems, depression Sleep: Positive for OSA, on therapy since September 2017.  Admits to fatigability, daytime sleepiness, cannot sleep on his back; negative; No bruxism, restless legs, hypnogognic hallucinations, no cataplexy Other comprehensive 14 point system review is negative.   PHYSICAL EXAM:   VS:  BP 122/80   Pulse 63   Ht 6\' 4"  (1.93 m)   Wt (!) 354 lb 9.6 oz (160.8 kg)   SpO2 96%   BMI 43.16 kg/m     Repeat blood pressure by me was 110/78  Wt Readings from Last 3 Encounters:  07/15/23 (!) 354 lb 9.6 oz (160.8 kg)  04/28/23 (!) 347 lb 9.6 oz (157.7 kg)  02/17/23 (!) 337 lb 12.8 oz (153.2 kg)    General: Alert, oriented, no distress.  Skin: normal turgor, no rashes, warm and dry HEENT: Normocephalic, atraumatic. Pupils equal round and reactive to light; sclera anicteric; extraocular muscles intact;  Nose without nasal septal hypertrophy Mouth/Parynx benign; Mallinpatti scale 4 Neck: No JVD, no carotid bruits; normal carotid upstroke Lungs: clear to ausculatation and percussion; no wheezing or rales Chest wall: without tenderness to palpitation Heart: PMI not displaced, RRR, s1 s2 normal, 1/6 systolic murmur, no diastolic murmur, no rubs, gallops, thrills, or heaves Abdomen: Central adiposity; soft, nontender; no hepatosplenomehaly, BS+; abdominal aorta nontender and not dilated by palpation. Back: no CVA tenderness Pulses 2+ Musculoskeletal: full range of motion, normal strength, no joint deformities Extremities: no clubbing cyanosis or edema, Homan's sign negative  Neurologic: grossly nonfocal; Cranial nerves grossly wnl Psychologic: Normal mood and affect    Studies/Labs Reviewed:  EKG  Interpretation Date/Time:  Thursday July 15 2023 09:13:15 EST Ventricular Rate:  63 PR Interval:  228 QRS Duration:  84 QT Interval:  416 QTC Calculation: 425 R Axis:   1  Text Interpretation: Sinus rhythm with 1st degree A-V block Nonspecific T wave abnormality When compared with ECG of 17-Feb-2023 11:10, Inverted T waves have replaced nonspecific T wave abnormality in Inferior leads Nonspecific T wave abnormality, improved in Lateral leads Confirmed by Nicki Guadalajara ([id number]) on 07/15/2023 9:47:36 AM    I personally reviewed the ECG from June 01, 2022:  Sinus rhythm with bigeminy, NSSTT wave abnormality   October 29, 2021 ECG (independently read by me):Sinus bradycardia at 51 with sinus arrythmia; 1st degree AV block, PR 212 msec  April 23, 2021 ECG (independently read by me): Sinus bradycardia at 58, 1st degree AV block  Recent Labs:    Latest Ref Rng & Units 04/28/2023   10:37 AM 01/20/2023   10:42 AM 01/01/2023   11:15 AM  BMP  Glucose 70 - 99 mg/dL 79  96  70   BUN 8 - 27 mg/dL 13  11  21    Creatinine 0.76 - 1.27 mg/dL 6.04  5.40  9.81   BUN/Creat Ratio 10 - 24 12     Sodium 134 - 144 mmol/L 143  140  142   Potassium 3.5 - 5.2 mmol/L 4.4  3.9  4.2   Chloride 96 - 106 mmol/L 105  104  107   CO2 20 - 29 mmol/L 28  27  26    Calcium 8.6 - 10.2 mg/dL 8.9  9.4  9.0         Latest Ref Rng & Units 12/29/2022    4:22 AM 04/05/2022    6:08 AM 04/04/2022    4:51 AM  Hepatic Function  Total Protein 6.5 - 8.1 g/dL 7.4  6.2  6.6   Albumin 3.5 - 5.0 g/dL 3.7  2.6  2.8   AST 15 - 41 U/L 16  87  153   ALT 0 - 44 U/L 16  74  90   Alk Phosphatase 38 - 126 U/L 67  63  68   Total Bilirubin 0.3 - 1.2 mg/dL 1.7  0.7  1.0        Latest Ref Rng & Units 01/01/2023   11:15 AM 12/31/2022    8:09 AM 12/31/2022    8:03 AM  CBC  WBC 4.0 - 10.5 K/uL 4.9     Hemoglobin 13.0 - 17.0 g/dL 19.1  47.8  29.5    62.1   Hematocrit 39.0 - 52.0 % 46.0  44.0  44.0    45.0   Platelets  150 - 400 K/uL 207       Lab Results  Component Value Date   MCV 84.9 01/01/2023   MCV 84.8 12/29/2022   MCV 85.3 12/28/2022   Lab Results  Component Value Date   TSH 1.968 02/25/2016   No results found for: "HGBA1C"   BNP    Component Value Date/Time   BNP 158.6 (H) 01/20/2023 1042    ProBNP No results found for: "PROBNP"   Lipid Panel     Component Value Date/Time   CHOL 132 12/31/2022 0052   TRIG 67 12/31/2022 0052   HDL 24 (L) 12/31/2022 0052   CHOLHDL 5.5 12/31/2022 0052   VLDL 13 12/31/2022 0052   LDLCALC 95 12/31/2022 0052     RADIOLOGY: No results found.   Additional studies/ records that were reviewed today include:  I was able to obtain the patient's prior sleep evaluation which was a home study with [facility] on March 20, 2016 as noted above.  I reviewed the records of Dr. Royann Shivers.  I obtained a new download from his CPAP machine.  His CPAP machine is from 2017, although the download stated August 24, 2020 which is incorrect  A download was obtained from August 23 through July 14, 1999 when he 4.  AHI 3.2 with his AirSense 10 AutoSet unit pressure 12 to 20 cm and central events 1.3.   11/30/2022: CLINICAL INFORMATION The patient is referred for a BiPAP titration to treat sleep apnea.   Date of NPSG: 2017: AHI 58/h; CAI 46.8/h; Pt has been on CPAP since 2017.   SLEEP STUDY TECHNIQUE As per the AASM Manual for the Scoring of Sleep and Associated Events v2.3 (April 2016) with a hypopnea requiring 4% desaturations.   The channels recorded and monitored were frontal, central and occipital EEG, electrooculogram (EOG), submentalis EMG (chin), nasal and oral airflow, thoracic and abdominal wall motion, anterior tibialis EMG, snore microphone, electrocardiogram, and pulse oximetry. Bilevel positive airway pressure (BPAP) was initiated at the beginning of the study and titrated to treat sleep-disordered breathing.   MEDICATIONS ARIPiprazole (ABILIFY) 15 MG  tablet buPROPion (WELLBUTRIN SR) 200 MG 12 hr tablet diclofenac Sodium (VOLTAREN) 1 % GEL latanoprost (XALATAN) 0.005 % ophthalmic solution lisinopril (ZESTRIL) 40 MG tablet metoprolol succinate (TOPROL-XL) 50 MG 24 hr tablet rosuvastatin (CRESTOR) 40 MG tablet tamsulosin (FLOMAX) 0.4 MG CAPS capsule Medications self-administered by patient taken the night of the study : N/A   RESPIRATORY PARAMETERS Optimal IPAP Pressure (cm):15AHI at Optimal Pressure (/hr)0 Optimal EPAP Pressure (cm):            10           Overall Minimal O2 (%):87. O2 at Optimal Pressure (%):91.0   SLEEP ARCHITECTURE Start Time:10:34:02 PMStop Time:5:50:02 AMTotal Time (min):436Total Sleep Time (min):386 Sleep Latency (min):1.3Sleep Efficiency (%):88.5REM Latency (min):54.5WASO (min):48.7 Stage N1 (%):6.99Stage N2 (%):56.48Stage N3 (%):14.51Stage R (%):22 Supine (%):27.06Arousal Index (/hr):8.2          CARDIAC DATA The 2 lead EKG demonstrated sinus rhythm. The mean heart rate was 48.39 beats per minute. Other EKG findings include: PVCs.   LEG MOVEMENT DATA The total Periodic Limb Movements of Sleep (PLMS) were 314. The PLMS index was 48.81. A PLMS index of <15 is considered normal in adults.   IMPRESSIONS - BiPAP ST initated at 8/4 and was titrated to optimial BiPAP ST pressure at 15/10 cm of water, rate 16;  AHI 0, RDI 0, O2 nadir 94%. - Central sleep apnea was not  noted during this titration (CAI 2.8/h). - Oxygen desaturations to a nadir of 87% at 8/4 cm of water. - The patient snored with soft snoring volume. - 2-lead EKG demonstrated: PVCs - Moderate periodic limb movements were observed during this study. Arousals associated with PLMs were rare.   DIAGNOSIS - Obstructive Sleep Apnea (G47.33)   RECOMMENDATIONS - Recommend an initial trial of BiPAP ST therapy with IPAP 15, EPAP 10 cm H2O, rate 16 with a Large size Fisher&Paykel Full Face Vitera mask and heated humidification. - Effort should  be made to optimize nasal and oropharyngeal patency. - If patient is symptomatic with restless legs consider a trial of pharmacotherapy. - Avoid alcohol, sedatives and other CNS depressants that may worsen sleep apnea and disrupt normal sleep architecture. - Sleep hygiene should be reviewed to assess factors that may improve sleep quality. - Weight management (BMI 43) and regular exercise should be initiated or continued. - Recommend a download and sleep clinic evaluation after 4 - 6 weeks of therapy    ASSESSMENT:    1. Complex sleep apnea syndrome   2. PAF (paroxysmal atrial fibrillation) (HCC)   3. Essential hypertension   4. NICM (nonischemic cardiomyopathy) (HCC)   5. Morbid obesity (HCC)   6. Medication management   7. Anticoagulated     PLAN:  Robert Warren is a 77 year old African-American gentleman is a [place] [employer] veteran and has a history of atrial fibrillation with rapid ventricular response which was initially diagnosed in 2017 during an episode of urosepsis.  At that time, an echo Doppler study showed normal systolic function but there was evidence for cor pulmonale.  Nuclear stress test in 2017 did not show any evidence of ischemia.  He has a history of hyperlipidemia for which he has been on rosuvastatin.  He continues to be on anticoagulation with apixaban.  He was diagnosed with severe sleep apnea on initial home study in 2017 and apparently was set up by [employer] for CPAP therapy on May 05, 2016.  He has been on therapy ever since.  Presently, he has been going to bed at 8 PM but typically does not attempt to sleep until 1030 or 11.  He usually watches television in bed or reads but at times he does doze off during this.  When I initially saw him, I discussed optimal sleep hygiene and recommended that he use bed predominantly for sleep and not to wait several hours before putting his mask on.  In addition, previously he was taking his mask off around 4 AM going to the  bathroom and may not put it back on for several hours.  I discussed with him optimal sleep duration at 7 and 9 hours and that he should try using CPAP for ideally 8 hours duration if at all possible.  At his initial evaluation I also spent considerable time discussing potential adverse cardiovascular consequences of sleep apnea.  In February 2024 he was having significant central events and had an overall AHI of 15.3 with central apnea index of 11.4.  Subsequently he underwent BiPAP ST titration study and it was recommended that he initiate therapy with initial EPAP set at 10, IPAP at 15 and rate at 16.  Apparently he still not received his BiPAP ST device.  His most recent CPAP download shows a 95th percentile pressure at 14 with maximum average pressure at 15.1.  We will need to inquire why this has not yet been sent.  On exam today blood pressure by  me was 110/70 supine 94/60 sitting and he has experienced some dizziness.  I have recommended he reduce his losartan dose from 50 mg twice a day and take 50 mg in the morning and 25 mg at night.  He will continue with his furosemide which he takes with weight gain of 3 pounds overnight.  He is on Jardiance 12.5 mg daily and continues to take one half of a 25 mg pill.  He is on spironolactone 12.5 mg daily and metoprolol succinate 25 mg.  He continues to be on anticoagulation with Eliquis 5 mg twice a day with his history of PAF.  I will see him in 3 to 4 months for follow-up sleep evaluation and he will follow-up with Dr. Royann Shivers for his primary cardiology care and sees Dr. Sherril Croon in [place] for primary care.  Medication Adjustments/Labs and Tests Ordered: Current medicines are reviewed at length with the patient today.  Concerns regarding medicines are outlined above.  Medication changes, Labs and Tests ordered today are listed in the Patient Instructions below. Patient Instructions    Medication Instructions:  TAKE LOSARTAN 50MG  IN THE MORNING. TAKE LOSARTAN 25MG   AT NIGHT *If you need a refill on your cardiac medications before your next appointment, please call your pharmacy*   Lab Work: NO LABS ORDERED If you have labs (blood work) drawn today and your tests are completely normal, you will receive your results only by: MyChart Message (if you have MyChart) OR A paper copy in the mail If you have any lab test that is abnormal or we need to change your treatment, we will call you to review the results.   Testing/Procedures: NO TESTING ORDERED   Follow-Up: At [facility], you and your health needs are our priority.  As part of our continuing mission to provide you with exceptional heart care, we have created designated Provider Care Teams.  These Care Teams include your primary Cardiologist (physician) and Advanced Practice Providers (APPs -  Physician Assistants and Nurse Practitioners) who all work together to provide you with the care you need, when you need it.  We recommend signing up for the patient portal called "MyChart".  Sign up information is provided on this After Visit Summary.  MyChart is used to connect with patients for Virtual Visits (Telemedicine).  Patients are able to view lab/test results, encounter notes, upcoming appointments, etc.  Non-urgent messages can be sent to your provider as well.   To learn more about what you can do with MyChart, go to [email or web address].    Your next appointment:   4 month(s)  Provider:   DR. Nicki Guadalajara     If you have any questions or concerns regarding your c-pap, bi-pap or sleep accessories, please contact Brandie Rorie at [phone].      Signed, Nicki Guadalajara, MD, [facility], ABSM Diplomate, American Board of Sleep Medicine  07/25/2023 11:44 AM    [facility] [address], [address], [place], [place]  [address] Phone: [phone]

## 2023-07-23 DIAGNOSIS — I10 Essential (primary) hypertension: Secondary | ICD-10-CM | POA: Diagnosis not present

## 2023-07-25 ENCOUNTER — Encounter: Payer: Self-pay | Admitting: Cardiovascular Disease

## 2023-07-28 DIAGNOSIS — R5383 Other fatigue: Secondary | ICD-10-CM | POA: Diagnosis not present

## 2023-07-28 DIAGNOSIS — Z1339 Encounter for screening examination for other mental health and behavioral disorders: Secondary | ICD-10-CM | POA: Diagnosis not present

## 2023-07-28 DIAGNOSIS — I10 Essential (primary) hypertension: Secondary | ICD-10-CM | POA: Diagnosis not present

## 2023-07-28 DIAGNOSIS — Z6841 Body Mass Index (BMI) 40.0 and over, adult: Secondary | ICD-10-CM | POA: Diagnosis not present

## 2023-07-28 DIAGNOSIS — Z299 Encounter for prophylactic measures, unspecified: Secondary | ICD-10-CM | POA: Diagnosis not present

## 2023-07-28 DIAGNOSIS — Z1331 Encounter for screening for depression: Secondary | ICD-10-CM | POA: Diagnosis not present

## 2023-07-28 DIAGNOSIS — E7800 Pure hypercholesterolemia, unspecified: Secondary | ICD-10-CM | POA: Diagnosis not present

## 2023-07-28 DIAGNOSIS — Z7189 Other specified counseling: Secondary | ICD-10-CM | POA: Diagnosis not present

## 2023-07-28 DIAGNOSIS — Z125 Encounter for screening for malignant neoplasm of prostate: Secondary | ICD-10-CM | POA: Diagnosis not present

## 2023-07-28 DIAGNOSIS — Z0000 Encounter for general adult medical examination without abnormal findings: Secondary | ICD-10-CM | POA: Diagnosis not present

## 2023-07-28 DIAGNOSIS — Z79899 Other long term (current) drug therapy: Secondary | ICD-10-CM | POA: Diagnosis not present

## 2023-08-02 DIAGNOSIS — M170 Bilateral primary osteoarthritis of knee: Secondary | ICD-10-CM | POA: Diagnosis not present

## 2023-08-03 ENCOUNTER — Encounter (INDEPENDENT_AMBULATORY_CARE_PROVIDER_SITE_OTHER): Payer: Self-pay | Admitting: Otolaryngology

## 2023-08-12 ENCOUNTER — Other Ambulatory Visit: Payer: Self-pay

## 2023-08-12 MED ORDER — METOPROLOL SUCCINATE ER 25 MG PO TB24: 25.0000 mg | ORAL_TABLET | Freq: Every day | ORAL | 2 refills | Status: DC

## 2023-08-12 MED ORDER — SPIRONOLACTONE 25 MG PO TABS: 12.5000 mg | ORAL_TABLET | Freq: Every day | ORAL | 2 refills | Status: DC

## 2023-08-12 MED ORDER — LOSARTAN POTASSIUM 50 MG PO TABS: 50.0000 mg | ORAL_TABLET | Freq: Two times a day (BID) | ORAL | 2 refills | Status: DC

## 2023-08-12 MED ORDER — FUROSEMIDE 40 MG PO TABS: ORAL_TABLET | ORAL | 2 refills | Status: DC

## 2023-08-23 DIAGNOSIS — I10 Essential (primary) hypertension: Secondary | ICD-10-CM | POA: Diagnosis not present

## 2023-09-09 DIAGNOSIS — M170 Bilateral primary osteoarthritis of knee: Secondary | ICD-10-CM | POA: Diagnosis not present

## 2023-09-17 ENCOUNTER — Telehealth (INDEPENDENT_AMBULATORY_CARE_PROVIDER_SITE_OTHER): Payer: Self-pay | Admitting: Otolaryngology

## 2023-09-17 NOTE — Telephone Encounter (Signed)
confirmed appt & location [id number] afm

## 2023-09-20 ENCOUNTER — Ambulatory Visit (INDEPENDENT_AMBULATORY_CARE_PROVIDER_SITE_OTHER): Payer: Medicare Other

## 2023-09-20 VITALS — BP 124/80 | HR 64 | Ht 76.0 in | Wt 330.0 lb

## 2023-09-20 DIAGNOSIS — H9313 Tinnitus, bilateral: Secondary | ICD-10-CM | POA: Diagnosis not present

## 2023-09-20 DIAGNOSIS — H6123 Impacted cerumen, bilateral: Secondary | ICD-10-CM | POA: Diagnosis not present

## 2023-09-20 DIAGNOSIS — R42 Dizziness and giddiness: Secondary | ICD-10-CM

## 2023-09-22 DIAGNOSIS — H9313 Tinnitus, bilateral: Secondary | ICD-10-CM | POA: Insufficient documentation

## 2023-09-22 DIAGNOSIS — I10 Essential (primary) hypertension: Secondary | ICD-10-CM | POA: Diagnosis not present

## 2023-09-22 DIAGNOSIS — R42 Dizziness and giddiness: Secondary | ICD-10-CM | POA: Insufficient documentation

## 2023-09-22 DIAGNOSIS — H6123 Impacted cerumen, bilateral: Secondary | ICD-10-CM | POA: Insufficient documentation

## 2023-09-22 NOTE — Progress Notes (Signed)
Patient ID: Robert Warren, male   DOB: May 29, 1946, 78 y.o.   MRN: [id number]  CC: Recurrent dizziness, bilateral tinnitus  HPI:  Robert Warren is a 78 y.o. male who presents today complaining of intermittent recurrent dizziness for the past year.  He describes his dizziness as an off-balance and lightheaded sensation.  He is symptomatic on a daily basis.  He was treated with meclizine without improvement in his symptoms.  He also complains of intermittent bilateral tinnitus over the past year.  He describes the tinnitus as a cricket noise.  It is nonpulsatile.  The patient has no previous ENT surgery.  He has a history of hypertension and atrial fibrillation, but denies any diabetes.  Currently he denies any otalgia, otorrhea, or any spinning vertigo.  Past Medical History:  Diagnosis Date   Anxiety    Atrial fibrillation (HCC)    Hypertension    Shortness of breath    Sleep apnea     Past Surgical History:  Procedure Laterality Date   Anal cyst Removal     COLONOSCOPY N/A 06/20/2014   Procedure: COLONOSCOPY;  Surgeon: Malissa Hippo, MD;  [facility];  Service: Endoscopy;  Laterality: N/A;  1030   COLONOSCOPY WITH PROPOFOL N/A 03/14/2021   Procedure: COLONOSCOPY WITH PROPOFOL;  Surgeon: Dolores Frame, MD;  [facility];  Service: Gastroenterology;  Laterality: N/A;  10:50   CYSTOSCOPY     CYSTOSCOPY WITH RETROGRADE PYELOGRAM, URETEROSCOPY AND STENT PLACEMENT Left 02/18/2023   Procedure: CYSTOSCOPY WITH RETROGRADE PYELOGRAM, URETEROSCOPY AND STENT PLACEMENT;  Surgeon: Malen Gauze, MD;  Location: [facility];  Service: Urology;  Laterality: Left;   EXTRACORPOREAL SHOCK WAVE LITHOTRIPSY Left 08/04/2022   Procedure: EXTRACORPOREAL SHOCK WAVE LITHOTRIPSY (ESWL);  Surgeon: Malen Gauze, MD;  Location: [facility];  Service: Urology;  Laterality: Left;   GREEN LIGHT LASER TURP (TRANSURETHRAL RESECTION OF PROSTATE     HOLMIUM LASER APPLICATION Left  02/18/2023   Procedure: HOLMIUM LASER APPLICATION;  Surgeon: Malen Gauze, MD;  Location: [facility];  Service: Urology;  Laterality: Left;   POLYPECTOMY  03/14/2021   Procedure: POLYPECTOMY;  Surgeon: Dolores Frame, MD;  [facility];  Service: Gastroenterology;;   RIGHT/LEFT HEART CATH AND CORONARY ANGIOGRAPHY N/A 12/31/2022   Procedure: RIGHT/LEFT HEART CATH AND CORONARY ANGIOGRAPHY;  Surgeon: Kathleene Hazel, MD;  [facility];  Service: Cardiovascular;  Laterality: N/A;    Family History  Problem Relation Age of Onset   Heart disease Mother    Cancer - Lung Father    Heart attack Father     Social History:  reports that he quit smoking about 27 years ago. His smoking use included cigarettes. He started smoking about 57 years ago. He has a 30 pack-year smoking history. He has never used smokeless tobacco. He reports current alcohol use. He reports that he does not use drugs.  Allergies: No Known Allergies  Prior to Admission medications   Medication Sig Start Date End Date Taking? Authorizing Provider  apixaban (ELIQUIS) 5 MG TABS tablet Take 5 mg by mouth 2 (two) times daily. 09/19/16  Yes [provider]  buPROPion (WELLBUTRIN SR) 200 MG 12 hr tablet Take 200 mg by mouth daily as needed (Depression). 12/10/15  Yes [provider]  clotrimazole-betamethasone (LOTRISONE) cream Apply 1 Application topically 2 (two) times daily. 03/03/23  Yes McKenzie, Mardene Celeste, MD  empagliflozin (JARDIANCE) 25 MG TABS tablet Take 12.5 mg by mouth daily.  Taking 12/5 mg daily   Yes [provider]  furosemide (LASIX) 40 MG tablet Take 40 mg daily. May take an additional tablet on the days of weight gain of 3 lb over night and 5 lb weight gain in 1 week. 08/12/23  Yes Juanda Crumble K, PA-C  latanoprost (XALATAN) 0.005 % ophthalmic solution Place 1 drop into both eyes at bedtime. 09/09/21  Yes [provider]  losartan  (COZAAR) 50 MG tablet Take 1 tablet (50 mg total) by mouth 2 (two) times daily. 08/12/23  Yes Juanda Crumble K, PA-C  metoprolol succinate (TOPROL-XL) 25 MG 24 hr tablet Take 1 tablet (25 mg total) by mouth daily. 08/12/23  Yes Juanda Crumble K, PA-C  rosuvastatin (CRESTOR) 40 MG tablet Take 40 mg by mouth daily.   Yes [provider]  spironolactone (ALDACTONE) 25 MG tablet Take 1/2 tablet (12.5 mg total) by mouth daily. 08/12/23  Yes Juanda Crumble K, PA-C  ARIPiprazole (ABILIFY) 15 MG tablet Take 15 mg by mouth daily. Patient not taking: Reported on 09/20/2023 04/22/22   [provider]  diclofenac Sodium (VOLTAREN) 1 % GEL Apply 2 g topically daily as needed (pain). Patient not taking: Reported on 09/20/2023 07/30/16   [provider]    Blood pressure 124/80, pulse 64, height 6\' 4"  (1.93 m), weight (!) 330 lb (149.7 kg), SpO2 95%. Exam: General: Communicates without difficulty, well nourished, no acute distress. Head: Normocephalic, no evidence injury, no tenderness, facial buttresses intact without stepoff. Face/sinus: No tenderness to palpation and percussion. Facial movement is normal and symmetric. Eyes: PERRL, EOMI. No scleral icterus, conjunctivae clear. Neuro: CN II exam reveals vision grossly intact.  No nystagmus at any point of gaze. Ears: Auricles well formed without lesions.  Bilateral cerumen impaction.  Nose: External evaluation reveals normal support and skin without lesions.  Dorsum is intact.  Anterior rhinoscopy reveals congested mucosa over anterior aspect of inferior turbinates and intact septum.  No purulence noted. Oral:  Oral cavity and oropharynx are intact, symmetric, without erythema or edema.  Mucosa is moist without lesions. Neck: Full range of motion without pain.  There is no significant lymphadenopathy.  No masses palpable.  Thyroid bed within normal limits to palpation.  Parotid glands and submandibular glands equal bilaterally without  mass.  Trachea is midline. Neuro:  CN 2-12 grossly intact. Vestibular: No nystagmus at any point of gaze. Dix Hallpike negative. Vestibular: There is no nystagmus with pneumatic pressure on either tympanic membrane or Valsalva. The cerebellar examination is unremarkable.    Procedure: Bilateral cerumen disimpaction Anesthesia: None Description: Under the operating microscope, the cerumen is carefully removed with a combination of cerumen currette, alligator forceps, and suction catheters.  After the cerumen is removed, the TMs are noted to be normal.  No mass, erythema, or lesions. The patient tolerated the procedure well.    Assessment: 1.  Recurrent  dizziness of unknown etiology. The possible differential diagnoses include transient BPPV, vestibular migraine, Meniere's disease, peripheral vestibular dysfunction, or other central/systemic causes.   2.  Bilateral cerumen impaction.  After the cerumen disimpaction procedure, his ear canals and tympanic membranes are noted to be normal. 3.  Bilateral subjective tinnitus.  No audiologist is available for his hearing test today.  Plan: 1.  Otomicroscopy with bilateral cerumen disimpaction. 2.  The pathophysiology of vestibular dysfunction and dizziness are discussed extensively with the patient. The possible differential diagnoses are reviewed. Questions are invited and answered.   3.  The strategies to cope  with tinnitus, including the use of masker, hearing aids, tinnitus retraining therapy, and avoidance of caffeine and alcohol are discussed. 4.  The patient will likely benefit from undergoing physical therapy/vestibular rehabilitation to improve the balancing function. A referral will be arranged as soon as possible.  5.  If the patient continues to be symptomatic, he may benefit from vestibular neurodiagnostic testing at a tertiary care center to evaluate for possible vestibular dysfunction.   Jamaiya Tunnell W Natosha Bou 09/22/2023, 7:39 AM

## 2023-09-28 ENCOUNTER — Ambulatory Visit: Payer: Medicare Other | Attending: Otolaryngology | Admitting: Physical Therapy

## 2023-09-28 DIAGNOSIS — R42 Dizziness and giddiness: Secondary | ICD-10-CM | POA: Diagnosis not present

## 2023-09-28 DIAGNOSIS — R2681 Unsteadiness on feet: Secondary | ICD-10-CM | POA: Diagnosis not present

## 2023-09-28 NOTE — Therapy (Signed)
 OUTPATIENT PHYSICAL THERAPY VESTIBULAR EVALUATION     Patient Name: Robert Warren MRN: [id number] DOB:08-Apr-1946, 78 y.o., male Today's Date: 09/29/2023  END OF SESSION:  PT End of Session - 09/29/23 1222     Visit Number 1    Number of Visits 5    Date for PT Re-Evaluation 11/05/23    Authorization Type Medicare    Authorization Time Period 09-28-23 - 11-26-23    PT Start Time 0925    PT Stop Time 1015    PT Time Calculation (min) 50 min    Activity Tolerance Patient tolerated treatment well    Behavior During Therapy [facility] for tasks assessed/performed             Past Medical History:  Diagnosis Date   Anxiety    Atrial fibrillation (HCC)    Hypertension    Shortness of breath    Sleep apnea    Past Surgical History:  Procedure Laterality Date   Anal cyst Removal     COLONOSCOPY N/A 06/20/2014   Procedure: COLONOSCOPY;  Surgeon: Robert RAYMOND Rivet, MD;  [facility];  Service: Endoscopy;  Laterality: N/A;  1030   COLONOSCOPY WITH PROPOFOL  N/A 03/14/2021   Procedure: COLONOSCOPY WITH PROPOFOL ;  Surgeon: Robert Angelia Sieving, MD;  [facility];  Service: Gastroenterology;  Laterality: N/A;  10:50   CYSTOSCOPY     CYSTOSCOPY WITH RETROGRADE PYELOGRAM, URETEROSCOPY AND STENT PLACEMENT Left 02/18/2023   Procedure: CYSTOSCOPY WITH RETROGRADE PYELOGRAM, URETEROSCOPY AND STENT PLACEMENT;  Surgeon: Robert Belvie CROME, MD;  [facility]: [facility];  Service: Urology;  Laterality: Left;   EXTRACORPOREAL SHOCK WAVE LITHOTRIPSY Left 08/04/2022   Procedure: EXTRACORPOREAL SHOCK WAVE LITHOTRIPSY (ESWL);  Surgeon: Robert Belvie CROME, MD;  [facility]: [facility];  Service: Urology;  Laterality: Left;   GREEN LIGHT LASER TURP (TRANSURETHRAL RESECTION OF PROSTATE     HOLMIUM LASER APPLICATION Left 02/18/2023   Procedure: HOLMIUM LASER APPLICATION;  Surgeon: Robert Belvie CROME, MD;  [facility]: [facility];  Service: Urology;  Laterality: Left;   POLYPECTOMY  03/14/2021    Procedure: POLYPECTOMY;  Surgeon: Robert Angelia Sieving, MD;  [facility];  Service: Gastroenterology;;   RIGHT/LEFT HEART CATH AND CORONARY ANGIOGRAPHY N/A 12/31/2022   Procedure: RIGHT/LEFT HEART CATH AND CORONARY ANGIOGRAPHY;  Surgeon: Robert Lonni BIRCH, MD;  [facility];  Service: Cardiovascular;  Laterality: N/A;   Patient Active Problem List   Diagnosis Date Noted   Dizziness 09/22/2023   Tinnitus of both ears 09/22/2023   Impacted cerumen of both ears 09/22/2023   Mild dilation of ascending aorta (HCC) 04/27/2023   CHF (congestive heart failure) (HCC) 12/28/2022   Acute heart failure with preserved ejection fraction (HFpEF) (HCC) 12/28/2022   Bigeminy 06/01/2022   Essential hypertension 06/01/2022   Severe sepsis (HCC) 04/03/2022   Fecal occult blood test positive 02/27/2021   Current use of long term anticoagulation 06/24/2017   Paroxysmal atrial fibrillation (HCC) 08/27/2016   Junctional bradycardia 08/27/2016   OSA (obstructive sleep apnea) 08/27/2016   Mixed hyperlipidemia 08/27/2016   Lower urinary tract infectious disease 02/25/2016   BPH (benign prostatic hyperplasia) 02/25/2016   Morbid obesity (HCC)    Atrial fibrillation with RVR (HCC) 02/24/2016   Anemia 02/24/2016   Hematuria of undiagnosed cause 02/24/2016   CKD (chronic kidney disease) 02/24/2016    PCP: [name]., MD REFERRING PROVIDER: Karis Clunes, MD  REFERRING DIAG: R42 (ICD-10-CM) - Dizziness  THERAPY DIAG:  Dizziness and giddiness  Unsteadiness on feet  ONSET DATE: Referral date 09-20-23   Rationale for Evaluation and Treatment: Rehabilitation  SUBJECTIVE:   SUBJECTIVE STATEMENT: Pt reports he has tinnitus in his ears - sometimes a swooshing sound but sometimes like crickets in the summertime; says it is not constant.  Reports hearing low frequencies are decreased.  Audiologist was not available on 09-20-23 at appt with Dr. Karis so hearing test was not  done.  Last bad episode was about a month ago and lasted about 3 weeks; but says it levels off.  Says today is a typical day. PCP prescribed Meclizine but he doesn't take it. Pt accompanied by: self  PERTINENT HISTORY: Per Dr. Rojean note on 09-20-23 Robert Warren is a 78 y.o. male who presents today complaining of intermittent recurrent dizziness for the past year.  He describes his dizziness as an off-balance and lightheaded sensation.  He is symptomatic on a daily basis.  He was treated with meclizine without improvement in his symptoms.  He also complains of intermittent bilateral tinnitus over the past year.  He describes the tinnitus as a cricket noise.  It is nonpulsatile.  The patient has no previous ENT surgery.  He has a history of hypertension and atrial fibrillation, but denies any diabetes.  Currently he denies any otalgia, otorrhea, or any spinning vertigo.  PMHx:  Anxiety,Atrial fibrillation (HCC), HTN, CHF, chronic kidney disease, sleep apnea, bil. Knee OA - reports currently receiving injections to hopefully avoid surgery    PAIN:  Are you having pain? No  PRECAUTIONS: None  RED FLAGS: None   WEIGHT BEARING RESTRICTIONS: No  FALLS: Has patient fallen in last 6 months? No  LIVING ENVIRONMENT: Lives with: lives with their spouse Lives in: House/apartment Stairs: Yes: Internal: 12 steps; on right going up and External: 4 steps; can reach both Has following equipment at home: None  PLOF: Independent  PATIENT GOALS: figure out about the dizziness  OBJECTIVE:  Note: Objective measures were completed at Evaluation unless otherwise noted.  DIAGNOSTIC FINDINGS: N/A  COGNITION: Overall cognitive status: Within functional limits for tasks assessed   SENSATION: WFL  POSTURE:  rounded shoulders and forward head  Cervical ROM:  WFL's   STRENGTH: WNL's   TRANSFERS: Assistive device utilized: None  Sit to stand: Modified independence Stand to sit: Modified  independence  GAIT: Gait pattern: WFL Distance walked: 30' Assistive device utilized: None Level of assistance: Modified independence Comments: no device- pt does have c/o knee pain due to OA  FUNCTIONAL TESTS:  MCTSIB: Condition 1: Avg of 3 trials: 30 sec, Condition 2: Avg of 3 trials: 30 sec, Condition 3: Avg of 3 trials: 30 sec, Condition 4: Avg of 3 trials: 9, 20 (average 14)104 sec, and Total Score: 104/120  VESTIBULAR ASSESSMENT:  GENERAL OBSERVATION: pt is 78 yr old gentleman amb. With mildly antalgic gait pattern due to OA in both knees;  pt reports dizziness is constant   SYMPTOM BEHAVIOR:  Subjective history: pt reports dizziness is constant but is not room spinning vertigo; states he saw Dr. Karis but did not have hearing test done nor workup for the dizziness  Non-Vestibular symptoms: changes in hearing, neck pain, and tinnitus  Type of dizziness: Imbalance (Disequilibrium), Unsteady with head/body turns, Lightheadedness/Faint, and dizziness  Frequency: daily  Duration: constant - but varies in intensity  Aggravating factors: Spontaneous, Induced by motion: occur when walking, turning body quickly, turning head quickly, driving, and activity in general, and Worse in the dark  Relieving factors:  head stationary and rest  Progression of symptoms: worse  OCULOMOTOR EXAM:  Ocular Alignment: normal  Ocular ROM: No Limitations  Spontaneous Nystagmus: absent  Gaze-Induced Nystagmus: absent  Smooth Pursuits: intact  Saccades: intact     POSITIONAL TESTING: Right Sidelying: no nystagmus Left Sidelying: no nystagmus  MOTION SENSITIVITY:  Motion Sensitivity Quotient Intensity: 0 = none, 1 = Lightheaded, 2 = Mild, 3 = Moderate, 4 = Severe, 5 = Vomiting  Intensity  1. Sitting to supine 1  2. Supine to L side   3. Supine to R side   4. Supine to sitting   5. L Hallpike-Dix   6. Up from L    7. R Hallpike-Dix   8. Up from R    9. Sitting, head tipped to L knee   10.  Head up from L knee   11. Sitting, head tipped to R knee   12. Head up from R knee   13. Sitting head turns x5 2  14.Sitting head nods x5 2  15. In stance, 180 turn to L    16. In stance, 180 turn to R                                                                                                                              TREATMENT DATE: 09-28-23   Gaze Adaptation:  x1 Viewing Horizontal: Position: standing, Time: 30 secs, Reps: 1, and Comment: unsteadiness noted after 25 secs and x1 Viewing Vertical:  Position: standing, Time: 30 secs, Reps: 1, and Comment: unsteadiness noted after approx. 20 secs  PATIENT EDUCATION: Education details: HEP - x1 viewing and corner balance - feet apart with EO and EC Person educated: Patient Education method: Explanation, Demonstration, and Handouts Education comprehension: verbalized understanding and returned demonstration   HOME EXERCISE PROGRAM:  GOALS: Goals reviewed with patient? Yes  SHORT TERM GOALS: same as LTG's as ELOS= 4 weeks    LONG TERM GOALS: Target date: 10-29-23  Pt will demonstrate improved vestibular input in maintaining balance by standing on foam with feet apart with EC for 30 secs without LOB. Baseline: 9, 20 secs on 09-28-23 Goal status: INITIAL  2.  Pt will subjectively report at least 25% improvement in chronic dizziness.  Baseline:  Goal status: INITIAL  3.  Independent in HEP for vestibular exercises. Baseline:  Goal status: INITIAL    ASSESSMENT:  CLINICAL IMPRESSION: Patient is a 78 y.o. gentleman who was seen today for physical therapy evaluation and treatment for chronic dizziness of unknown etiology.  Pt has symptoms of vestibular hypofunction and had postural instability with performing x1 viewing exercise in standing with both horizontal and vertical head turns.  Pt able to stand for 30 secs on conditions 1,2, &3 of mCTSIB but only stand for 9 secs and then 20 secs with EC on foam, indicative of  decreased vestibular input in maintaining balance.  Pt reports he has tinnitus and reports constant dizziness.  Pt will benefit from PT to address balance and decreased vestibular input in maintaining balance.     OBJECTIVE IMPAIRMENTS: decreased balance and dizziness.   ACTIVITY LIMITATIONS: bending, squatting, and locomotion level  PARTICIPATION LIMITATIONS: interpersonal relationship and community activity  PERSONAL FACTORS: Past/current experiences, Time since onset of injury/illness/exacerbation, and 1-2 comorbidities: atrial fibrillation & bil. Knee OA  are also affecting patient's functional outcome.   REHAB POTENTIAL: Good  CLINICAL DECISION MAKING: Evolving/moderate complexity  EVALUATION COMPLEXITY: Moderate   PLAN:  PT FREQUENCY: 1x/week  PT DURATION: 4 weeks + eval  PLANNED INTERVENTIONS: 97110-Therapeutic exercises, 97530- Therapeutic activity, [id number]- Neuromuscular re-education, [phone]- Self Care, [id number]- Gait training, Patient/Family education, and Vestibular training  PLAN FOR NEXT SESSION: check HEP (x1 viewing and balance on foam); exercises to increase vestibular input   Mandalyn Pasqua, [place], PT 09/29/2023, 12:24 PM

## 2023-10-04 ENCOUNTER — Ambulatory Visit: Payer: Medicare Other | Admitting: Physical Therapy

## 2023-10-04 VITALS — BP 139/86

## 2023-10-04 DIAGNOSIS — R2681 Unsteadiness on feet: Secondary | ICD-10-CM

## 2023-10-04 DIAGNOSIS — R42 Dizziness and giddiness: Secondary | ICD-10-CM

## 2023-10-04 NOTE — Therapy (Signed)
OUTPATIENT PHYSICAL THERAPY VESTIBULAR TREATMENT NOTE     Patient Name: Robert Warren MRN: [id number] DOB:08-21-1946, 78 y.o., male Today's Date: 10/05/2023  END OF SESSION:  PT End of Session - 10/05/23 1624     Visit Number 2    Number of Visits 5    Date for PT Re-Evaluation 11/05/23    Authorization Type Medicare    Authorization Time Period 09-28-23 - 11-26-23    PT Start Time 0928    PT Stop Time 1012    PT Time Calculation (min) 44 min    Activity Tolerance Patient tolerated treatment well    Behavior During Therapy [facility] for tasks assessed/performed              Past Medical History:  Diagnosis Date   Anxiety    Atrial fibrillation (HCC)    Hypertension    Shortness of breath    Sleep apnea    Past Surgical History:  Procedure Laterality Date   Anal cyst Removal     COLONOSCOPY N/A 06/20/2014   Procedure: COLONOSCOPY;  Surgeon: Malissa Hippo, MD;  [facility];  Service: Endoscopy;  Laterality: N/A;  1030   COLONOSCOPY WITH PROPOFOL N/A 03/14/2021   Procedure: COLONOSCOPY WITH PROPOFOL;  Surgeon: Dolores Frame, MD;  [facility];  Service: Gastroenterology;  Laterality: N/A;  10:50   CYSTOSCOPY     CYSTOSCOPY WITH RETROGRADE PYELOGRAM, URETEROSCOPY AND STENT PLACEMENT Left 02/18/2023   Procedure: CYSTOSCOPY WITH RETROGRADE PYELOGRAM, URETEROSCOPY AND STENT PLACEMENT;  Surgeon: Malen Gauze, MD;  Location: [facility];  Service: Urology;  Laterality: Left;   EXTRACORPOREAL SHOCK WAVE LITHOTRIPSY Left 08/04/2022   Procedure: EXTRACORPOREAL SHOCK WAVE LITHOTRIPSY (ESWL);  Surgeon: Malen Gauze, MD;  Location: [facility];  Service: Urology;  Laterality: Left;   GREEN LIGHT LASER TURP (TRANSURETHRAL RESECTION OF PROSTATE     HOLMIUM LASER APPLICATION Left 02/18/2023   Procedure: HOLMIUM LASER APPLICATION;  Surgeon: Malen Gauze, MD;  Location: [facility];  Service: Urology;  Laterality: Left;   POLYPECTOMY  03/14/2021    Procedure: POLYPECTOMY;  Surgeon: Dolores Frame, MD;  [facility];  Service: Gastroenterology;;   RIGHT/LEFT HEART CATH AND CORONARY ANGIOGRAPHY N/A 12/31/2022   Procedure: RIGHT/LEFT HEART CATH AND CORONARY ANGIOGRAPHY;  Surgeon: Kathleene Hazel, MD;  [facility];  Service: Cardiovascular;  Laterality: N/A;   Patient Active Problem List   Diagnosis Date Noted   Dizziness 09/22/2023   Tinnitus of both ears 09/22/2023   Impacted cerumen of both ears 09/22/2023   Mild dilation of ascending aorta (HCC) 04/27/2023   CHF (congestive heart failure) (HCC) 12/28/2022   Acute heart failure with preserved ejection fraction (HFpEF) (HCC) 12/28/2022   Bigeminy 06/01/2022   Essential hypertension 06/01/2022   Severe sepsis (HCC) 04/03/2022   Fecal occult blood test positive 02/27/2021   Current use of long term anticoagulation 06/24/2017   Paroxysmal atrial fibrillation (HCC) 08/27/2016   Junctional bradycardia 08/27/2016   OSA (obstructive sleep apnea) 08/27/2016   Mixed hyperlipidemia 08/27/2016   Lower urinary tract infectious disease 02/25/2016   BPH (benign prostatic hyperplasia) 02/25/2016   Morbid obesity (HCC)    Atrial fibrillation with RVR (HCC) 02/24/2016   Anemia 02/24/2016   Hematuria of undiagnosed cause 02/24/2016   CKD (chronic kidney disease) 02/24/2016    PCP: Robert Warren., MD REFERRING PROVIDER: Newman Pies, MD  REFERRING DIAG: R42 (ICD-10-CM) - Dizziness  THERAPY DIAG:  Dizziness and  giddiness  Unsteadiness on feet  ONSET DATE: Referral date 09-20-23   Rationale for Evaluation and Treatment: Rehabilitation  SUBJECTIVE:   SUBJECTIVE STATEMENT: Pt reports his knees are bothering him today - thinks may be partly due to weather; going to try to get injections but thinks he is going to end up having to have TKA surgery.  Reports dizziness is better than it was last week; has been doing exercises at home as instructed.  Pt  reports he does the letter exercise for 30 secs and it does not provoke dizziness.  Pt accompanied by: self  PERTINENT HISTORY: Per Dr. Avel Sensor note on 09-20-23 "Robert Warren is a 78 y.o. male who presents today complaining of intermittent recurrent dizziness for the past year.  He describes his dizziness as an off-balance and lightheaded sensation.  He is symptomatic on a daily basis.  He was treated with meclizine without improvement in his symptoms.  He also complains of intermittent bilateral tinnitus over the past year.  He describes the tinnitus as a cricket noise.  It is nonpulsatile.  The patient has no previous ENT surgery.  He has a history of hypertension and atrial fibrillation, but denies any diabetes.  Currently he denies any otalgia, otorrhea, or any spinning vertigo."  PMHx:  Anxiety,Atrial fibrillation (HCC), HTN, CHF, chronic kidney disease, sleep apnea, bil. Knee OA - reports currently receiving injections to hopefully avoid surgery    PAIN:  Are you having pain? No  PRECAUTIONS: None  RED FLAGS: None   WEIGHT BEARING RESTRICTIONS: No  FALLS: Has patient fallen in last 6 months? No  LIVING ENVIRONMENT: Lives with: lives with their spouse Lives in: House/apartment Stairs: Yes: Internal: 12 steps; on right going up and External: 4 steps; can reach both Has following equipment at home: None  PLOF: Independent  PATIENT GOALS: "figure out about the dizziness"  OBJECTIVE:  Note: Objective measures were completed at Evaluation unless otherwise noted.  DIAGNOSTIC FINDINGS: N/A  COGNITION: Overall cognitive status: Within functional limits for tasks assessed   SENSATION: WFL  POSTURE:  rounded shoulders and forward head  Cervical ROM:  WFL's   STRENGTH: WNL's   TRANSFERS: Assistive device utilized: None  Sit to stand: Modified independence Stand to sit: Modified independence  GAIT: Gait pattern: WFL Distance walked: 30' Assistive device utilized:  None Level of assistance: Modified independence Comments: no device- pt does have c/o knee pain due to OA  FUNCTIONAL TESTS:  MCTSIB: Condition 1: Avg of 3 trials: 30 sec, Condition 2: Avg of 3 trials: 30 sec, Condition 3: Avg of 3 trials: 30 sec, Condition 4: Avg of 3 trials: 9, 20 (average 14)104 sec, and Total Score: 104/120  VESTIBULAR ASSESSMENT:  GENERAL OBSERVATION: pt is 78 yr old gentleman amb. With mildly antalgic gait pattern due to OA in both knees;  pt reports dizziness is constant   SYMPTOM BEHAVIOR:  Subjective history: pt reports dizziness is constant but is not room spinning vertigo; states he saw Dr. Suszanne Conners but did not have hearing test done nor workup for the dizziness  Non-Vestibular symptoms: changes in hearing, neck pain, and tinnitus  Type of dizziness: Imbalance (Disequilibrium), Unsteady with head/body turns, Lightheadedness/Faint, and dizziness  Frequency: daily  Duration: constant - but varies in intensity  Aggravating factors: Spontaneous, Induced by motion: occur when walking, turning body quickly, turning head quickly, driving, and activity in general, and Worse in the dark  Relieving factors: head stationary and rest  Progression of symptoms: worse  OCULOMOTOR  EXAM:  Ocular Alignment: normal  Ocular ROM: No Limitations  Spontaneous Nystagmus: absent  Gaze-Induced Nystagmus: absent  Smooth Pursuits: intact  Saccades: intact     POSITIONAL TESTING: Right Sidelying: no nystagmus Left Sidelying: no nystagmus  MOTION SENSITIVITY:  Motion Sensitivity Quotient Intensity: 0 = none, 1 = Lightheaded, 2 = Mild, 3 = Moderate, 4 = Severe, 5 = Vomiting  Intensity  1. Sitting to supine 1  2. Supine to L side   3. Supine to R side   4. Supine to sitting   5. L Hallpike-Dix   6. Up from L    7. R Hallpike-Dix   8. Up from R    9. Sitting, head tipped to L knee   10. Head up from L knee   11. Sitting, head tipped to R knee   12. Head up from R knee   13.  Sitting head turns x5 2  14.Sitting head nods x5 2  15. In stance, 180 turn to L    16. In stance, 180 turn to R                                                                                                                              TREATMENT DATE: 10-04-23  NeuroRe-ed:  SVA line 11:  DVA line 10 (WNL's as < 2 line difference)  Gaze stabilization;  pt performed x1 viewing exercise in standing; target 4' away on plain background 60 secs x 1 rep horizontally;  vertical - 60 secs x 1 rep in standing;  pt reported no dizziness upon completion of horizontal nor vertical x1 viewing exercise  Rockerboard - anterior/posterior direction; performed inside // bars with UE support prn 10 reps x 2 reps with UE support prn; pt c/o knee pain with this exercise  Performed 10 reps rocking anterior/posterior with EC with UE support  Stood on rockerboard - performed head turns horizontally 10 reps, vertical head turns 10 reps with UE support prn  In seated position - pt performed horizontal head turns with dizziness rating 1/5;  performed vertical head turns 5 reps with dizziness rating 2/5  Pt stood on Airex - feet apart - EO and EC - head turns horizontal and vertical 5 reps each EO and EC;  pt reported discomfort in knees with static standing on Airex  Self Care; reviewed HEP, and LTG's; pt's knee pain is impacting standing tolerance and gait; agreed to place pt on hold for 30 days to return to PT should episode of vertigo re-occur; pt agrees that dizziness is improved as of current time but participation in PT is limited for performing standing and dynamic gait activities due to chronic knee pain   PATIENT EDUCATION: Education details: HEP - x1 viewing and corner balance - feet apart with EO and EC Person educated: Patient Education method: Explanation, Demonstration, and Handouts Education comprehension: verbalized understanding and returned demonstration   HOME EXERCISE  PROGRAM:  GOALS:  Goals reviewed with patient? Yes  SHORT TERM GOALS: same as LTG's as ELOS= 4 weeks    LONG TERM GOALS: Target date: 10-29-23  Pt will demonstrate improved vestibular input in maintaining balance by standing on foam with feet apart with EC for 30 secs without LOB. Baseline: 9, 20 secs on 09-28-23 Goal status: Goal met 10-04-23  2.  Pt will subjectively report at least 25% improvement in chronic dizziness.  Baseline:  Goal status: Goal met 10-04-23  3.  Independent in HEP for vestibular exercises. Baseline:  Goal status: INITIAL    ASSESSMENT:  CLINICAL IMPRESSION: PT session focused on vestibular exercises for increased vestibular input in maintaining balance.  Pt's standing tolerance is very much limited by c/o chronic knee pain due to OA.  Pt reports dizziness is much improved in today's session compared to that in previous session last week at initial eval.  Pt requests to be placed on hold for 30 days to return for followup prn pending re-occurrence of vertigo.  Pt reports dizziness at current time is manageable and improved compared to intensity at time of eval; pt's participattion in weight bearing activities is limited by chronic knee pain due to OA.  Hold for 30 days - will D/C if no follow up is needed.   OBJECTIVE IMPAIRMENTS: decreased balance and dizziness.   ACTIVITY LIMITATIONS: bending, squatting, and locomotion level  PARTICIPATION LIMITATIONS: interpersonal relationship and community activity  PERSONAL FACTORS: Past/current experiences, Time since onset of injury/illness/exacerbation, and 1-2 comorbidities: atrial fibrillation & bil. Knee OA  are also affecting patient's functional outcome.   REHAB POTENTIAL: Good  CLINICAL DECISION MAKING: Evolving/moderate complexity  EVALUATION COMPLEXITY: Moderate   PLAN:  PT FREQUENCY: 1x/week  PT DURATION: 4 weeks + eval  PLANNED INTERVENTIONS: 97110-Therapeutic exercises, 97530- Therapeutic  activity, [id number]- Neuromuscular re-education, [phone]- Self Care, [id number]- Gait training, Patient/Family education, and Vestibular training  PLAN FOR NEXT SESSION:  hold for 30 days  - will D/C if no follow up needed   Korde Jeppsen, Donavan Burnet, PT 10/05/2023, 4:26 PM

## 2023-10-05 ENCOUNTER — Encounter: Payer: Self-pay | Admitting: Physical Therapy

## 2023-10-07 DIAGNOSIS — M170 Bilateral primary osteoarthritis of knee: Secondary | ICD-10-CM | POA: Diagnosis not present

## 2023-10-12 ENCOUNTER — Ambulatory Visit: Payer: Medicare Other | Admitting: Physical Therapy

## 2023-10-15 ENCOUNTER — Ambulatory Visit (HOSPITAL_COMMUNITY)
Admission: RE | Admit: 2023-10-15 | Discharge: 2023-10-15 | Disposition: A | Discharge status: Home / Self Care | Payer: Medicare Other | Source: Ambulatory Visit | Attending: Urology | Admitting: Urology

## 2023-10-15 DIAGNOSIS — N281 Cyst of kidney, acquired: Secondary | ICD-10-CM | POA: Diagnosis not present

## 2023-10-15 DIAGNOSIS — N200 Calculus of kidney: Secondary | ICD-10-CM | POA: Insufficient documentation

## 2023-10-19 ENCOUNTER — Ambulatory Visit: Payer: Medicare Other | Admitting: Physical Therapy

## 2023-10-20 ENCOUNTER — Ambulatory Visit: Payer: Medicare Other | Admitting: Urology

## 2023-10-20 VITALS — BP 132/75

## 2023-10-20 DIAGNOSIS — Z09 Encounter for follow-up examination after completed treatment for conditions other than malignant neoplasm: Secondary | ICD-10-CM | POA: Diagnosis not present

## 2023-10-20 DIAGNOSIS — N200 Calculus of kidney: Secondary | ICD-10-CM

## 2023-10-20 LAB — URINALYSIS, ROUTINE W REFLEX MICROSCOPIC
Bilirubin, UA: NEGATIVE
Ketones, UA: NEGATIVE
Nitrite, UA: NEGATIVE
Specific Gravity, UA: 1.015 (ref 1.005–1.030)
Urobilinogen, Ur: 2 mg/dL — ABNORMAL HIGH (ref 0.2–1.0)
pH, UA: 6 (ref 5.0–7.5)

## 2023-10-20 LAB — MICROSCOPIC EXAMINATION

## 2023-10-20 NOTE — Progress Notes (Signed)
 10/20/2023 10:50 AM   Robert Warren Mar 16, 1946 [id number]  Referring provider: Ignatius Specking, MD [address] [place],  [place] [address]  Followup nephrolithiasis   HPI: Robert Warren is a 78yo here for followup for nephrolithiasis. No stone events since last visit. Renal US shows no calculi and no hydronephrosis.    PMH: Past Medical History:  Diagnosis Date   Anxiety    Atrial fibrillation (HCC)    Hypertension    Shortness of breath    Sleep apnea     Surgical History: Past Surgical History:  Procedure Laterality Date   Anal cyst Removal     COLONOSCOPY N/A 06/20/2014   Procedure: COLONOSCOPY;  Surgeon: Malissa Hippo, MD;  [facility];  Service: Endoscopy;  Laterality: N/A;  1030   COLONOSCOPY WITH PROPOFOL N/A 03/14/2021   Procedure: COLONOSCOPY WITH PROPOFOL;  Surgeon: Dolores Frame, MD;  [facility];  Service: Gastroenterology;  Laterality: N/A;  10:50   CYSTOSCOPY     CYSTOSCOPY WITH RETROGRADE PYELOGRAM, URETEROSCOPY AND STENT PLACEMENT Left 02/18/2023   Procedure: CYSTOSCOPY WITH RETROGRADE PYELOGRAM, URETEROSCOPY AND STENT PLACEMENT;  Surgeon: Malen Gauze, MD;  Location: [facility];  Service: Urology;  Laterality: Left;   EXTRACORPOREAL SHOCK WAVE LITHOTRIPSY Left 08/04/2022   Procedure: EXTRACORPOREAL SHOCK WAVE LITHOTRIPSY (ESWL);  Surgeon: Malen Gauze, MD;  Location: [facility];  Service: Urology;  Laterality: Left;   GREEN LIGHT LASER TURP (TRANSURETHRAL RESECTION OF PROSTATE     HOLMIUM LASER APPLICATION Left 02/18/2023   Procedure: HOLMIUM LASER APPLICATION;  Surgeon: Malen Gauze, MD;  Location: [facility];  Service: Urology;  Laterality: Left;   POLYPECTOMY  03/14/2021   Procedure: POLYPECTOMY;  Surgeon: Dolores Frame, MD;  [facility];  Service: Gastroenterology;;   RIGHT/LEFT HEART CATH AND CORONARY ANGIOGRAPHY N/A 12/31/2022   Procedure: RIGHT/LEFT HEART CATH AND CORONARY ANGIOGRAPHY;   Surgeon: Kathleene Hazel, MD;  [facility];  Service: Cardiovascular;  Laterality: N/A;    Home Medications:  Allergies as of 10/20/2023   No Known Allergies      Medication List        Accurate as of October 20, 2023 10:50 AM. If you have any questions, ask your nurse or doctor.          ARIPiprazole 15 MG tablet Commonly known as: ABILIFY Take 15 mg by mouth daily.   buPROPion 200 MG 12 hr tablet Commonly known as: WELLBUTRIN SR Take 200 mg by mouth daily as needed (Depression).   clotrimazole-betamethasone cream Commonly known as: LOTRISONE Apply 1 Application topically 2 (two) times daily.   diclofenac Sodium 1 % Gel Commonly known as: VOLTAREN Apply 2 g topically daily as needed (pain).   Eliquis 5 MG Tabs tablet Generic drug: apixaban Take 5 mg by mouth 2 (two) times daily.   furosemide 40 MG tablet Commonly known as: LASIX Take 40 mg daily. May take an additional tablet on the days of weight gain of 3 lb over night and 5 lb weight gain in 1 week.   Jardiance 25 MG Tabs tablet Generic drug: empagliflozin Take 12.5 mg by mouth daily. Taking 12/5 mg daily   latanoprost 0.005 % ophthalmic solution Commonly known as: XALATAN Place 1 drop into both eyes at bedtime.   losartan 50 MG tablet Commonly known as: COZAAR Take 1 tablet (50 mg total) by mouth 2 (two) times daily.   metoprolol succinate 25 MG 24 hr tablet  Commonly known as: TOPROL-XL Take 1 tablet (25 mg total) by mouth daily.   rosuvastatin 40 MG tablet Commonly known as: CRESTOR Take 40 mg by mouth daily.   spironolactone 25 MG tablet Commonly known as: Aldactone Take 1/2 tablet (12.5 mg total) by mouth daily.        Allergies: No Known Allergies  Family History: Family History  Problem Relation Age of Onset   Heart disease Mother    Cancer - Lung Father    Heart attack Father     Social History:  reports that he quit smoking about 27 years ago. His  smoking use included cigarettes. He started smoking about 57 years ago. He has a 30 pack-year smoking history. He has never used smokeless tobacco. He reports current alcohol use. He reports that he does not use drugs.  ROS: All other review of systems were reviewed and are negative except what is noted above in HPI  Physical Exam: BP 132/75   Constitutional:  Alert and oriented, No acute distress. HEENT: [place] AT, moist mucus membranes.  Trachea midline, no masses. Cardiovascular: No clubbing, cyanosis, or edema. Respiratory: Normal respiratory effort, no increased work of breathing. GI: Abdomen is soft, nontender, nondistended, no abdominal masses GU: No CVA tenderness.  Lymph: No cervical or inguinal lymphadenopathy. Skin: No rashes, bruises or suspicious lesions. Neurologic: Grossly intact, no focal deficits, moving all 4 extremities. Psychiatric: Normal mood and affect.  Laboratory Data: Lab Results  Component Value Date   WBC 4.9 01/01/2023   HGB 14.5 01/01/2023   HCT 46.0 01/01/2023   MCV 84.9 01/01/2023   PLT 207 01/01/2023    Lab Results  Component Value Date   CREATININE 1.08 04/28/2023    No results found for: "PSA"  No results found for: "TESTOSTERONE"  No results found for: "HGBA1C"  Urinalysis    Component Value Date/Time   COLORURINE AMBER (A) 04/03/2022 1409   APPEARANCEUR Clear 03/03/2023 1510   LABSPEC 1.025 04/03/2022 1409   PHURINE 5.0 04/03/2022 1409   GLUCOSEU 3+ (A) 03/03/2023 1510   HGBUR LARGE (A) 04/03/2022 1409   BILIRUBINUR Negative 03/03/2023 1510   KETONESUR NEGATIVE 04/03/2022 1409   PROTEINUR 1+ (A) 03/03/2023 1510   PROTEINUR >=300 (A) 04/03/2022 1409   NITRITE Negative 03/03/2023 1510   NITRITE NEGATIVE 04/03/2022 1409   LEUKOCYTESUR 1+ (A) 03/03/2023 1510   LEUKOCYTESUR MODERATE (A) 04/03/2022 1409    Lab Results  Component Value Date   LABMICR See below: 03/03/2023   WBCUA 11-30 (A) 03/03/2023   LABEPIT 0-10 03/03/2023    MUCUS Present 04/23/2022   BACTERIA Moderate (A) 03/03/2023    Pertinent Imaging: Renal US 10/15/23: Images reviewed and discussed with the patient Results for orders placed during the hospital encounter of 10/05/22  Abdomen 1 view (KUB)  Narrative CLINICAL DATA:  Nephrolithiasis.  EXAM: ABDOMEN - 1 VIEW  COMPARISON:  08/25/2022.  FINDINGS: The bowel gas pattern is normal. No renal or ureteral calculus is seen bilaterally. Degenerative changes in the thoracolumbar spine.  IMPRESSION: No renal or ureteral calculus.   Electronically Signed By: Thornell Sartorius Warren.D. On: 10/06/2022 02:10  No results found for this or any previous visit.  No results found for this or any previous visit.  No results found for this or any previous visit.  Results for orders placed during the hospital encounter of 10/15/23  Ultrasound renal complete  Narrative : PROCEDURE: [place] RENAL  HISTORY: Patient is a Robert Warren with nephrolithiasis.  COMPARISON: U/S renal 04/09/2023, CT renal 01/05/2023.  TECHNIQUE: Two-dimensional grayscale and color Doppler ultrasound of the kidneys was performed.  FINDINGS: The urinary bladder demonstrates normal anechoic echogenicity. The bilateral ureteral jets are not visualized.  The right kidney measures 13.4 x 7.2 x 5.9 cm. Renal cortical echotexture is within normal limits. There is no hydronephrosis. There are no stones. There are no cysts.  The left kidney measures 14.4 x 6.4 x 5.6 cm. Renal cortical echotexture is within normal limits. There is no hydronephrosis. There are no stones. There are multiple simple cysts with the largest measuring 3.0 cm at the inferior pole.  IMPRESSION: 1. Simple left renal cyst. Otherwise unremarkable ultrasound of the kidneys.  Thank you for allowing [place] to assist in the care of this patient.   Electronically Signed By: Lestine Box Warren.D. On: 10/15/2023 20:43  No results found for this or any previous  visit.  No results found for this or any previous visit.  Results for orders placed during the hospital encounter of 01/05/23  CT RENAL STONE STUDY  Narrative CLINICAL DATA:  Follow-up renal calculi post treatment (ESWL).  EXAM: CT ABDOMEN AND PELVIS WITHOUT CONTRAST  TECHNIQUE: Multidetector CT imaging of the abdomen and pelvis was performed following the standard protocol without IV contrast.  RADIATION DOSE REDUCTION: This exam was performed according to the departmental dose-optimization program which includes automated exposure control, adjustment of the mA and/or kV according to patient size and/or use of iterative reconstruction technique.  COMPARISON:  04/03/2022  FINDINGS: Lower chest: The lung bases are clear of acute process. No pleural effusion or pulmonary lesions. Moderate eventration of the right hemidiaphragm with overlying vascular crowding and streaky atelectasis. The heart is normal in size. No pericardial effusion. The distal esophagus and aorta are unremarkable.  Hepatobiliary: No hepatic lesions or intrahepatic biliary dilatation. Gallbladder is grossly normal. No common bile duct dilatation.  Pancreas: No mass, inflammation or ductal dilatation.  Spleen: Normal size.  No focal lesions.  Adrenals/Urinary Tract: The adrenal glands are normal.  The interpolar left renal calculus is no longer identified. No small stone fragments. No right-sided renal calculi.  No left-sided hydroureteronephrosis but there are nonobstructing stone fragments in the distal left ureter. Suspect 3 or 4 stone fragments. The stone fragment in the UVJ measures approximately 6 mm. There are 2 smaller more proximal calculi and a larger most proximal calculus measuring 6 mm.  No bladder calculi.  Stomach/Bowel: The stomach, duodenum, small bowel and colon grossly normal. The terminal ileum and appendix are normal. There is scattered colonic diverticulosis but no findings  for acute diverticulitis.  Vascular/Lymphatic: Normal caliber abdominal aorta. Minimal scattered atherosclerotic calcifications. No mesenteric or retroperitoneal mass or adenopathy.  Reproductive: The prostate gland and seminal vesicles are unremarkable. Suspect prior TURP.  Other: No pelvic mass or adenopathy. No free pelvic fluid collections. No inguinal mass or adenopathy. Small periumbilical abdominal wall hernia containing fat and supraumbilical abdominal wall hernia containing fat. This is located approximately 8 cm above the umbilicus.  Musculoskeletal: No significant bony findings. Stable degenerative changes involving the spine.  IMPRESSION: 1. The interpolar left renal calculus is no longer identified. No residual small stone fragments in the kidney. 2. There are 3 or 4 nonobstructing stone fragments in the distal left ureter. The stone fragment in the UVJ measures approximately 6 mm. There are 2 smaller more proximal calculi and a larger most proximal calculus measuring 6 mm. 3. No right-sided renal or ureteral calculi. 4. No acute  abdominal/pelvic findings, mass lesions or adenopathy. 5. Small periumbilical abdominal wall hernia containing fat and supraumbilical abdominal wall hernia containing fat.   Electronically Signed By: Rudie Meyer Warren.D. On: 01/08/2023 09:57   Assessment & Plan:    1. Kidney stone (Primary) Followup 6 months with a renal US - Urinalysis, Routine w reflex microscopic   No follow-ups on file.  Wilkie Aye, MD  [facility] [place]

## 2023-10-22 DIAGNOSIS — I10 Essential (primary) hypertension: Secondary | ICD-10-CM | POA: Diagnosis not present

## 2023-10-26 ENCOUNTER — Ambulatory Visit: Payer: Medicare Other | Admitting: Physical Therapy

## 2023-10-26 ENCOUNTER — Encounter: Payer: Self-pay | Admitting: Urology

## 2023-10-26 NOTE — Patient Instructions (Signed)

## 2023-10-27 DIAGNOSIS — M170 Bilateral primary osteoarthritis of knee: Secondary | ICD-10-CM | POA: Diagnosis not present

## 2023-10-27 DIAGNOSIS — M25561 Pain in right knee: Secondary | ICD-10-CM | POA: Diagnosis not present

## 2023-10-27 DIAGNOSIS — R262 Difficulty in walking, not elsewhere classified: Secondary | ICD-10-CM | POA: Diagnosis not present

## 2023-10-27 DIAGNOSIS — M25562 Pain in left knee: Secondary | ICD-10-CM | POA: Diagnosis not present

## 2023-11-02 ENCOUNTER — Other Ambulatory Visit: Payer: Self-pay | Admitting: Physician Assistant

## 2023-11-02 DIAGNOSIS — I5022 Chronic systolic (congestive) heart failure: Secondary | ICD-10-CM | POA: Diagnosis not present

## 2023-11-02 DIAGNOSIS — Z299 Encounter for prophylactic measures, unspecified: Secondary | ICD-10-CM | POA: Diagnosis not present

## 2023-11-02 DIAGNOSIS — E261 Secondary hyperaldosteronism: Secondary | ICD-10-CM | POA: Diagnosis not present

## 2023-11-02 DIAGNOSIS — I10 Essential (primary) hypertension: Secondary | ICD-10-CM | POA: Diagnosis not present

## 2023-11-02 DIAGNOSIS — I429 Cardiomyopathy, unspecified: Secondary | ICD-10-CM | POA: Diagnosis not present

## 2023-11-02 DIAGNOSIS — I4891 Unspecified atrial fibrillation: Secondary | ICD-10-CM | POA: Diagnosis not present

## 2023-11-03 DIAGNOSIS — M25561 Pain in right knee: Secondary | ICD-10-CM | POA: Diagnosis not present

## 2023-11-03 DIAGNOSIS — R262 Difficulty in walking, not elsewhere classified: Secondary | ICD-10-CM | POA: Diagnosis not present

## 2023-11-03 DIAGNOSIS — M25562 Pain in left knee: Secondary | ICD-10-CM | POA: Diagnosis not present

## 2023-11-03 DIAGNOSIS — M170 Bilateral primary osteoarthritis of knee: Secondary | ICD-10-CM | POA: Diagnosis not present

## 2023-11-04 DIAGNOSIS — M170 Bilateral primary osteoarthritis of knee: Secondary | ICD-10-CM | POA: Diagnosis not present

## 2023-11-10 DIAGNOSIS — M170 Bilateral primary osteoarthritis of knee: Secondary | ICD-10-CM | POA: Diagnosis not present

## 2023-11-10 DIAGNOSIS — R262 Difficulty in walking, not elsewhere classified: Secondary | ICD-10-CM | POA: Diagnosis not present

## 2023-11-10 DIAGNOSIS — M25561 Pain in right knee: Secondary | ICD-10-CM | POA: Diagnosis not present

## 2023-11-10 DIAGNOSIS — M25562 Pain in left knee: Secondary | ICD-10-CM | POA: Diagnosis not present

## 2023-11-12 ENCOUNTER — Ambulatory Visit: Payer: Medicare Other | Admitting: Cardiovascular Disease

## 2023-11-17 DIAGNOSIS — M25562 Pain in left knee: Secondary | ICD-10-CM | POA: Diagnosis not present

## 2023-11-17 DIAGNOSIS — M25561 Pain in right knee: Secondary | ICD-10-CM | POA: Diagnosis not present

## 2023-11-17 DIAGNOSIS — R262 Difficulty in walking, not elsewhere classified: Secondary | ICD-10-CM | POA: Diagnosis not present

## 2023-11-17 DIAGNOSIS — M170 Bilateral primary osteoarthritis of knee: Secondary | ICD-10-CM | POA: Diagnosis not present

## 2023-11-21 DIAGNOSIS — I10 Essential (primary) hypertension: Secondary | ICD-10-CM | POA: Diagnosis not present

## 2023-11-22 DIAGNOSIS — H401111 Primary open-angle glaucoma, right eye, mild stage: Secondary | ICD-10-CM | POA: Diagnosis not present

## 2023-11-24 DIAGNOSIS — M170 Bilateral primary osteoarthritis of knee: Secondary | ICD-10-CM | POA: Diagnosis not present

## 2023-11-24 DIAGNOSIS — M25561 Pain in right knee: Secondary | ICD-10-CM | POA: Diagnosis not present

## 2023-11-24 DIAGNOSIS — M25562 Pain in left knee: Secondary | ICD-10-CM | POA: Diagnosis not present

## 2023-11-24 DIAGNOSIS — R262 Difficulty in walking, not elsewhere classified: Secondary | ICD-10-CM | POA: Diagnosis not present

## 2023-12-02 DIAGNOSIS — M170 Bilateral primary osteoarthritis of knee: Secondary | ICD-10-CM | POA: Diagnosis not present

## 2023-12-08 DIAGNOSIS — M170 Bilateral primary osteoarthritis of knee: Secondary | ICD-10-CM | POA: Diagnosis not present

## 2023-12-08 DIAGNOSIS — M25562 Pain in left knee: Secondary | ICD-10-CM | POA: Diagnosis not present

## 2023-12-08 DIAGNOSIS — R262 Difficulty in walking, not elsewhere classified: Secondary | ICD-10-CM | POA: Diagnosis not present

## 2023-12-08 DIAGNOSIS — M25561 Pain in right knee: Secondary | ICD-10-CM | POA: Diagnosis not present

## 2023-12-15 DIAGNOSIS — R262 Difficulty in walking, not elsewhere classified: Secondary | ICD-10-CM | POA: Diagnosis not present

## 2023-12-15 DIAGNOSIS — M25561 Pain in right knee: Secondary | ICD-10-CM | POA: Diagnosis not present

## 2023-12-15 DIAGNOSIS — M170 Bilateral primary osteoarthritis of knee: Secondary | ICD-10-CM | POA: Diagnosis not present

## 2023-12-15 DIAGNOSIS — M25562 Pain in left knee: Secondary | ICD-10-CM | POA: Diagnosis not present

## 2023-12-22 DIAGNOSIS — I10 Essential (primary) hypertension: Secondary | ICD-10-CM | POA: Diagnosis not present

## 2023-12-28 DIAGNOSIS — M79671 Pain in right foot: Secondary | ICD-10-CM | POA: Diagnosis not present

## 2023-12-28 DIAGNOSIS — L565 Disseminated superficial actinic porokeratosis (DSAP): Secondary | ICD-10-CM | POA: Diagnosis not present

## 2023-12-28 DIAGNOSIS — L110 Acquired keratosis follicularis: Secondary | ICD-10-CM | POA: Diagnosis not present

## 2023-12-28 DIAGNOSIS — I739 Peripheral vascular disease, unspecified: Secondary | ICD-10-CM | POA: Diagnosis not present

## 2023-12-28 DIAGNOSIS — M79674 Pain in right toe(s): Secondary | ICD-10-CM | POA: Diagnosis not present

## 2023-12-30 DIAGNOSIS — M170 Bilateral primary osteoarthritis of knee: Secondary | ICD-10-CM | POA: Diagnosis not present

## 2024-01-11 ENCOUNTER — Ambulatory Visit: Admitting: Cardiovascular Disease

## 2024-01-22 DIAGNOSIS — I10 Essential (primary) hypertension: Secondary | ICD-10-CM | POA: Diagnosis not present

## 2024-01-25 DIAGNOSIS — M170 Bilateral primary osteoarthritis of knee: Secondary | ICD-10-CM | POA: Diagnosis not present

## 2024-01-29 DIAGNOSIS — M170 Bilateral primary osteoarthritis of knee: Secondary | ICD-10-CM | POA: Diagnosis not present

## 2024-02-01 DIAGNOSIS — Z299 Encounter for prophylactic measures, unspecified: Secondary | ICD-10-CM | POA: Diagnosis not present

## 2024-02-01 DIAGNOSIS — I5022 Chronic systolic (congestive) heart failure: Secondary | ICD-10-CM | POA: Diagnosis not present

## 2024-02-01 DIAGNOSIS — I10 Essential (primary) hypertension: Secondary | ICD-10-CM | POA: Diagnosis not present

## 2024-02-01 DIAGNOSIS — I4891 Unspecified atrial fibrillation: Secondary | ICD-10-CM | POA: Diagnosis not present

## 2024-02-01 DIAGNOSIS — R972 Elevated prostate specific antigen [PSA]: Secondary | ICD-10-CM | POA: Diagnosis not present

## 2024-02-01 DIAGNOSIS — N1831 Chronic kidney disease, stage 3a: Secondary | ICD-10-CM | POA: Diagnosis not present

## 2024-02-15 ENCOUNTER — Ambulatory Visit: Admitting: Cardiovascular Disease

## 2024-02-21 DIAGNOSIS — I10 Essential (primary) hypertension: Secondary | ICD-10-CM | POA: Diagnosis not present

## 2024-02-29 DIAGNOSIS — M170 Bilateral primary osteoarthritis of knee: Secondary | ICD-10-CM | POA: Diagnosis not present

## 2024-03-06 DIAGNOSIS — H401111 Primary open-angle glaucoma, right eye, mild stage: Secondary | ICD-10-CM | POA: Diagnosis not present

## 2024-03-09 DIAGNOSIS — M79674 Pain in right toe(s): Secondary | ICD-10-CM | POA: Diagnosis not present

## 2024-03-09 DIAGNOSIS — I739 Peripheral vascular disease, unspecified: Secondary | ICD-10-CM | POA: Diagnosis not present

## 2024-03-09 DIAGNOSIS — M79675 Pain in left toe(s): Secondary | ICD-10-CM | POA: Diagnosis not present

## 2024-03-09 DIAGNOSIS — M79672 Pain in left foot: Secondary | ICD-10-CM | POA: Diagnosis not present

## 2024-03-09 DIAGNOSIS — L110 Acquired keratosis follicularis: Secondary | ICD-10-CM | POA: Diagnosis not present

## 2024-03-09 DIAGNOSIS — M79671 Pain in right foot: Secondary | ICD-10-CM | POA: Diagnosis not present

## 2024-03-13 DIAGNOSIS — I798 Other disorders of arteries, arterioles and capillaries in diseases classified elsewhere: Secondary | ICD-10-CM | POA: Diagnosis not present

## 2024-03-13 DIAGNOSIS — M1711 Unilateral primary osteoarthritis, right knee: Secondary | ICD-10-CM | POA: Diagnosis not present

## 2024-03-13 DIAGNOSIS — M25561 Pain in right knee: Secondary | ICD-10-CM | POA: Diagnosis not present

## 2024-03-23 DIAGNOSIS — I10 Essential (primary) hypertension: Secondary | ICD-10-CM | POA: Diagnosis not present

## 2024-03-29 DIAGNOSIS — M170 Bilateral primary osteoarthritis of knee: Secondary | ICD-10-CM | POA: Diagnosis not present

## 2024-04-03 ENCOUNTER — Other Ambulatory Visit: Payer: Self-pay | Admitting: *Deleted

## 2024-04-03 MED ORDER — LOSARTAN POTASSIUM 50 MG PO TABS: 50.0000 mg | ORAL_TABLET | Freq: Two times a day (BID) | ORAL | 0 refills | Status: AC

## 2024-04-03 MED ORDER — METOPROLOL SUCCINATE ER 25 MG PO TB24: 25.0000 mg | ORAL_TABLET | Freq: Every day | ORAL | 0 refills | Status: DC

## 2024-04-03 MED ORDER — FUROSEMIDE 40 MG PO TABS: ORAL_TABLET | ORAL | 0 refills | Status: AC

## 2024-04-10 ENCOUNTER — Ambulatory Visit (HOSPITAL_COMMUNITY)
Admission: RE | Admit: 2024-04-10 | Discharge: 2024-04-10 | Disposition: A | Discharge status: Home / Self Care | Payer: Medicare Other | Source: Ambulatory Visit | Attending: Urology | Admitting: Urology

## 2024-04-10 DIAGNOSIS — N200 Calculus of kidney: Secondary | ICD-10-CM | POA: Insufficient documentation

## 2024-04-22 DIAGNOSIS — I10 Essential (primary) hypertension: Secondary | ICD-10-CM | POA: Diagnosis not present

## 2024-04-25 ENCOUNTER — Ambulatory Visit: Payer: Self-pay | Admitting: Urology

## 2024-04-26 ENCOUNTER — Encounter: Payer: Self-pay | Admitting: Urology

## 2024-04-26 ENCOUNTER — Ambulatory Visit: Payer: Medicare Other | Admitting: Urology

## 2024-04-26 VITALS — BP 126/79 | HR 60

## 2024-04-26 DIAGNOSIS — N3000 Acute cystitis without hematuria: Secondary | ICD-10-CM

## 2024-04-26 DIAGNOSIS — N200 Calculus of kidney: Secondary | ICD-10-CM

## 2024-04-26 DIAGNOSIS — R350 Frequency of micturition: Secondary | ICD-10-CM

## 2024-04-26 LAB — URINALYSIS, ROUTINE W REFLEX MICROSCOPIC
Bilirubin, UA: NEGATIVE
Ketones, UA: NEGATIVE
Nitrite, UA: POSITIVE — AB
Specific Gravity, UA: 1.02 (ref 1.005–1.030)
Urobilinogen, Ur: 2 mg/dL — ABNORMAL HIGH (ref 0.2–1.0)
pH, UA: 6 (ref 5.0–7.5)

## 2024-04-26 LAB — MICROSCOPIC EXAMINATION

## 2024-04-26 NOTE — Progress Notes (Signed)
 04/26/2024 10:51 AM   [name] [birth year] [id number]  Referring provider: [name], MD [address] [place],  [place] [address]  Followup nephrolithiasis   HPI: Mr Coonradt is a 78yo here for followup for nephrolithiasis. No stone events since last visit. No stone events since last visit. Renal US  shows no calculi. NO worsening LUTS. He has baseline urinary frequency and takes lasix  and jardiance . Occasional post void dribbling.    PMH: Past Medical History:  Diagnosis Date   Anxiety    Atrial fibrillation (HCC)    Hypertension    Shortness of breath    Sleep apnea     Surgical History: Past Surgical History:  Procedure Laterality Date   Anal cyst Removal     COLONOSCOPY N/A 06/20/2014   Procedure: COLONOSCOPY;  Surgeon: Claudis RAYMOND Rivet, MD;  Location: [facility];  Service: Endoscopy;  Laterality: N/A;  1030   COLONOSCOPY WITH PROPOFOL  N/A 03/14/2021   Procedure: COLONOSCOPY WITH PROPOFOL ;  Surgeon: Eartha Angelia Sieving, MD;  Location: [facility];  Service: Gastroenterology;  Laterality: N/A;  10:50   CYSTOSCOPY     CYSTOSCOPY WITH RETROGRADE PYELOGRAM, URETEROSCOPY AND STENT PLACEMENT Left 02/18/2023   Procedure: CYSTOSCOPY WITH RETROGRADE PYELOGRAM, URETEROSCOPY AND STENT PLACEMENT;  Surgeon: Sherrilee Belvie CROME, MD;  Location: [facility];  Service: Urology;  Laterality: Left;   EXTRACORPOREAL SHOCK WAVE LITHOTRIPSY Left 08/04/2022   Procedure: EXTRACORPOREAL SHOCK WAVE LITHOTRIPSY (ESWL);  Surgeon: Sherrilee Belvie CROME, MD;  Location: [facility];  Service: Urology;  Laterality: Left;   GREEN LIGHT LASER TURP (TRANSURETHRAL RESECTION OF PROSTATE     HOLMIUM LASER APPLICATION Left 02/18/2023   Procedure: HOLMIUM LASER APPLICATION;  Surgeon: Sherrilee Belvie CROME, MD;  Location: [facility];  Service: Urology;  Laterality: Left;   POLYPECTOMY  03/14/2021   Procedure: POLYPECTOMY;  Surgeon: Eartha Angelia Sieving, MD;  Location: [facility];  Service: Gastroenterology;;    RIGHT/LEFT HEART CATH AND CORONARY ANGIOGRAPHY N/A 12/31/2022   Procedure: RIGHT/LEFT HEART CATH AND CORONARY ANGIOGRAPHY;  Surgeon: Verlin Lonni BIRCH, MD;  Location: [facility];  Service: Cardiovascular;  Laterality: N/A;    Home Medications:  Allergies as of 04/26/2024   No Known Allergies      Medication List        Accurate as of April 26, 2024 10:51 AM. If you have any questions, ask your nurse or doctor.          ARIPiprazole 15 MG tablet Commonly known as: ABILIFY Take 15 mg by mouth daily.   buPROPion  200 MG 12 hr tablet Commonly known as: WELLBUTRIN  SR Take 200 mg by mouth daily as needed (Depression).   clotrimazole -betamethasone  cream Commonly known as: LOTRISONE  Apply 1 Application topically 2 (two) times daily.   diclofenac Sodium 1 % Gel Commonly known as: VOLTAREN Apply 2 g topically daily as needed (pain).   Eliquis  5 MG Tabs tablet Generic drug: apixaban  Take 5 mg by mouth 2 (two) times daily.   furosemide  40 MG tablet Commonly known as: LASIX  Take 40 mg daily. May take an additional tablet on the days of weight gain of 3 lb over night and 5 lb weight gain in 1 week.   Jardiance  25 MG Tabs tablet Generic drug: empagliflozin  Take 12.5 mg by mouth daily. Taking 12/5 mg daily   latanoprost  0.005 % ophthalmic solution Commonly known as: XALATAN  Place 1 drop into both eyes at bedtime.   losartan  50 MG tablet Commonly known as: COZAAR  Take  1 tablet (50 mg total) by mouth 2 (two) times daily.   metoprolol  succinate 25 MG 24 hr tablet Commonly known as: TOPROL -XL Take 1 tablet (25 mg total) by mouth daily.   rosuvastatin  40 MG tablet Commonly known as: CRESTOR  Take 40 mg by mouth daily.   spironolactone  25 MG tablet Commonly known as: Aldactone  Take 1/2 tablet (12.5 mg total) by mouth daily.        Allergies: No Known Allergies  Family History: Family History  Problem Relation Age of Onset   Heart disease Mother     Cancer - Lung Father    Heart attack Father     Social History:  reports that he quit smoking about 27 years ago. His smoking use included cigarettes. He started smoking about 57 years ago. He has a 30 pack-year smoking history. He has never used smokeless tobacco. He reports current alcohol  use. He reports that he does not use drugs.  ROS: All other review of systems were reviewed and are negative except what is noted above in HPI  Physical Exam: BP 126/79   Pulse 60   Constitutional:  Alert and oriented, No acute distress. HEENT: [place] AT, moist mucus membranes.  Trachea midline, no masses. Cardiovascular: No clubbing, cyanosis, or edema. Respiratory: Normal respiratory effort, no increased work of breathing. GI: Abdomen is soft, nontender, nondistended, no abdominal masses GU: No CVA tenderness.  Lymph: No cervical or inguinal lymphadenopathy. Skin: No rashes, bruises or suspicious lesions. Neurologic: Grossly intact, no focal deficits, moving all 4 extremities. Psychiatric: Normal mood and affect.  Laboratory Data: Lab Results  Component Value Date   WBC 4.9 01/01/2023   HGB 14.5 01/01/2023   HCT 46.0 01/01/2023   MCV 84.9 01/01/2023   PLT 207 01/01/2023    Lab Results  Component Value Date   CREATININE 1.08 04/28/2023    No results found for: PSA  No results found for: TESTOSTERONE  No results found for: HGBA1C  Urinalysis    Component Value Date/Time   COLORURINE AMBER (A) 04/03/2022 1409   APPEARANCEUR Clear 10/20/2023 1044   LABSPEC 1.025 04/03/2022 1409   PHURINE 5.0 04/03/2022 1409   GLUCOSEU 3+ (A) 10/20/2023 1044   HGBUR LARGE (A) 04/03/2022 1409   BILIRUBINUR Negative 10/20/2023 1044   KETONESUR NEGATIVE 04/03/2022 1409   PROTEINUR 1+ (A) 10/20/2023 1044   PROTEINUR >=300 (A) 04/03/2022 1409   NITRITE Negative 10/20/2023 1044   NITRITE NEGATIVE 04/03/2022 1409   LEUKOCYTESUR Trace (A) 10/20/2023 1044   LEUKOCYTESUR MODERATE (A) 04/03/2022  1409    Lab Results  Component Value Date   LABMICR See below: 10/20/2023   WBCUA 6-10 (A) 10/20/2023   LABEPIT 0-10 10/20/2023   MUCUS Present 04/23/2022   BACTERIA Few (A) 10/20/2023    Pertinent Imaging: Renal US  04/10/2024: Images reviewed and discussed with the patient  Results for orders placed during the hospital encounter of 10/05/22  Abdomen 1 view (KUB)  Narrative CLINICAL DATA:  Nephrolithiasis.  EXAM: ABDOMEN - 1 VIEW  COMPARISON:  08/25/2022.  FINDINGS: The bowel gas pattern is normal. No renal or ureteral calculus is seen bilaterally. Degenerative changes in the thoracolumbar spine.  IMPRESSION: No renal or ureteral calculus.   Electronically Signed By: Leita Birmingham M.D. On: 10/06/2022 02:10  No results found for this or any previous visit.  No results found for this or any previous visit.  No results found for this or any previous visit.  Results for orders placed during the hospital  encounter of 04/10/24  Ultrasound renal complete  Narrative CLINICAL DATA:  nephrolithiasis  EXAM: RENAL / URINARY TRACT ULTRASOUND COMPLETE  COMPARISON:  October 15, 2023  FINDINGS: Right Kidney:  Renal measurements: 12.7 x 5.6 x 6.1 cm = volume: 126 mL. Echogenicity within normal limits. No mass or hydronephrosis visualized. Portions are suboptimally assessed secondary to shadowing bowel gas.  Left Kidney:  Renal measurements: 12.5 x 5.5 x 6.4 cm = volume: 232 mL. Echogenicity within normal limits. No hydronephrosis visualized. There is an exophytic cyst of the inferior pole measuring up to 2.8 cm (for which no dedicated imaging follow-up is recommended). Additional smaller adjacent cyst measures up to 1.9 cm (for which no dedicated imaging follow-up is recommended).  Bladder:  Appears normal for degree of bladder distention.  Other:  Prostate is heterogeneous and enlarged with median lobe hypertrophy. It is estimated to measure at least  5.4 x 3.5 x 4.7 cm for an estimated volume of 47 ML.  IMPRESSION: 1. No hydronephrosis. 2. Prostatomegaly.   Electronically Signed By: Corean Salter M.D. On: 04/21/2024 09:22  No results found for this or any previous visit.  No results found for this or any previous visit.  Results for orders placed during the hospital encounter of 01/05/23  CT RENAL STONE STUDY  Narrative CLINICAL DATA:  Follow-up renal calculi post treatment (ESWL).  EXAM: CT ABDOMEN AND PELVIS WITHOUT CONTRAST  TECHNIQUE: Multidetector CT imaging of the abdomen and pelvis was performed following the standard protocol without IV contrast.  RADIATION DOSE REDUCTION: This exam was performed according to the departmental dose-optimization program which includes automated exposure control, adjustment of the mA and/or kV according to patient size and/or use of iterative reconstruction technique.  COMPARISON:  04/03/2022  FINDINGS: Lower chest: The lung bases are clear of acute process. No pleural effusion or pulmonary lesions. Moderate eventration of the right hemidiaphragm with overlying vascular crowding and streaky atelectasis. The heart is normal in size. No pericardial effusion. The distal esophagus and aorta are unremarkable.  Hepatobiliary: No hepatic lesions or intrahepatic biliary dilatation. Gallbladder is grossly normal. No common bile duct dilatation.  Pancreas: No mass, inflammation or ductal dilatation.  Spleen: Normal size.  No focal lesions.  Adrenals/Urinary Tract: The adrenal glands are normal.  The interpolar left renal calculus is no longer identified. No small stone fragments. No right-sided renal calculi.  No left-sided hydroureteronephrosis but there are nonobstructing stone fragments in the distal left ureter. Suspect 3 or 4 stone fragments. The stone fragment in the UVJ measures approximately 6 mm. There are 2 smaller more proximal calculi and a larger  most proximal calculus measuring 6 mm.  No bladder calculi.  Stomach/Bowel: The stomach, duodenum, small bowel and colon grossly normal. The terminal ileum and appendix are normal. There is scattered colonic diverticulosis but no findings for acute diverticulitis.  Vascular/Lymphatic: Normal caliber abdominal aorta. Minimal scattered atherosclerotic calcifications. No mesenteric or retroperitoneal mass or adenopathy.  Reproductive: The prostate gland and seminal vesicles are unremarkable. Suspect prior TURP.  Other: No pelvic mass or adenopathy. No free pelvic fluid collections. No inguinal mass or adenopathy. Small periumbilical abdominal wall hernia containing fat and supraumbilical abdominal wall hernia containing fat. This is located approximately 8 cm above the umbilicus.  Musculoskeletal: No significant bony findings. Stable degenerative changes involving the spine.  IMPRESSION: 1. The interpolar left renal calculus is no longer identified. No residual small stone fragments in the kidney. 2. There are 3 or 4 nonobstructing stone fragments in the  distal left ureter. The stone fragment in the UVJ measures approximately 6 mm. There are 2 smaller more proximal calculi and a larger most proximal calculus measuring 6 mm. 3. No right-sided renal or ureteral calculi. 4. No acute abdominal/pelvic findings, mass lesions or adenopathy. 5. Small periumbilical abdominal wall hernia containing fat and supraumbilical abdominal wall hernia containing fat.   Electronically Signed By: MYRTIS Stammer M.D. On: 01/08/2023 09:57   Assessment & Plan:    1. Kidney stone (Primary) -followup 1 year with renal US  - Urinalysis, Routine w reflex microscopic   No follow-ups on file.  Belvie Clara, MD  [facility] [place]

## 2024-04-26 NOTE — Patient Instructions (Signed)

## 2024-04-27 ENCOUNTER — Other Ambulatory Visit: Payer: Self-pay | Admitting: Cardiovascular Disease

## 2024-05-02 DIAGNOSIS — M170 Bilateral primary osteoarthritis of knee: Secondary | ICD-10-CM | POA: Diagnosis not present

## 2024-05-03 LAB — URINE CULTURE

## 2024-05-04 ENCOUNTER — Telehealth: Payer: Self-pay

## 2024-05-04 ENCOUNTER — Ambulatory Visit: Payer: Self-pay

## 2024-05-04 DIAGNOSIS — F319 Bipolar disorder, unspecified: Secondary | ICD-10-CM | POA: Diagnosis not present

## 2024-05-04 DIAGNOSIS — I4891 Unspecified atrial fibrillation: Secondary | ICD-10-CM | POA: Diagnosis not present

## 2024-05-04 DIAGNOSIS — I10 Essential (primary) hypertension: Secondary | ICD-10-CM | POA: Diagnosis not present

## 2024-05-04 DIAGNOSIS — N390 Urinary tract infection, site not specified: Secondary | ICD-10-CM | POA: Diagnosis not present

## 2024-05-04 DIAGNOSIS — Z299 Encounter for prophylactic measures, unspecified: Secondary | ICD-10-CM | POA: Diagnosis not present

## 2024-05-04 DIAGNOSIS — I5022 Chronic systolic (congestive) heart failure: Secondary | ICD-10-CM | POA: Diagnosis not present

## 2024-05-04 MED ORDER — CIPROFLOXACIN HCL 250 MG PO TABS: 250.0000 mg | ORAL_TABLET | Freq: Two times a day (BID) | ORAL | 0 refills | Status: AC

## 2024-05-04 NOTE — Telephone Encounter (Signed)
 Patient called with no answer. Detailed message left concerning positive urine culture and antibiotic sent to pharmacy.  Patient also informed my-chart message was sent.

## 2024-05-19 DIAGNOSIS — M79671 Pain in right foot: Secondary | ICD-10-CM | POA: Diagnosis not present

## 2024-05-19 DIAGNOSIS — I739 Peripheral vascular disease, unspecified: Secondary | ICD-10-CM | POA: Diagnosis not present

## 2024-05-19 DIAGNOSIS — L110 Acquired keratosis follicularis: Secondary | ICD-10-CM | POA: Diagnosis not present

## 2024-05-19 DIAGNOSIS — M79672 Pain in left foot: Secondary | ICD-10-CM | POA: Diagnosis not present

## 2024-05-19 DIAGNOSIS — M79674 Pain in right toe(s): Secondary | ICD-10-CM | POA: Diagnosis not present

## 2024-05-19 DIAGNOSIS — M79675 Pain in left toe(s): Secondary | ICD-10-CM | POA: Diagnosis not present

## 2024-05-23 DIAGNOSIS — I10 Essential (primary) hypertension: Secondary | ICD-10-CM | POA: Diagnosis not present

## 2024-06-05 DIAGNOSIS — Z23 Encounter for immunization: Secondary | ICD-10-CM | POA: Diagnosis not present

## 2024-06-07 DIAGNOSIS — M170 Bilateral primary osteoarthritis of knee: Secondary | ICD-10-CM | POA: Diagnosis not present

## 2024-06-20 DIAGNOSIS — M25562 Pain in left knee: Secondary | ICD-10-CM | POA: Diagnosis not present

## 2024-06-20 DIAGNOSIS — M170 Bilateral primary osteoarthritis of knee: Secondary | ICD-10-CM | POA: Diagnosis not present

## 2024-06-20 DIAGNOSIS — M25561 Pain in right knee: Secondary | ICD-10-CM | POA: Diagnosis not present

## 2024-06-20 DIAGNOSIS — R262 Difficulty in walking, not elsewhere classified: Secondary | ICD-10-CM | POA: Diagnosis not present

## 2024-06-27 DIAGNOSIS — R262 Difficulty in walking, not elsewhere classified: Secondary | ICD-10-CM | POA: Diagnosis not present

## 2024-06-27 DIAGNOSIS — M25561 Pain in right knee: Secondary | ICD-10-CM | POA: Diagnosis not present

## 2024-06-27 DIAGNOSIS — M170 Bilateral primary osteoarthritis of knee: Secondary | ICD-10-CM | POA: Diagnosis not present

## 2024-06-27 DIAGNOSIS — M25562 Pain in left knee: Secondary | ICD-10-CM | POA: Diagnosis not present

## 2024-07-04 DIAGNOSIS — M25562 Pain in left knee: Secondary | ICD-10-CM | POA: Diagnosis not present

## 2024-07-04 DIAGNOSIS — R262 Difficulty in walking, not elsewhere classified: Secondary | ICD-10-CM | POA: Diagnosis not present

## 2024-07-04 DIAGNOSIS — M25561 Pain in right knee: Secondary | ICD-10-CM | POA: Diagnosis not present

## 2024-07-04 DIAGNOSIS — M170 Bilateral primary osteoarthritis of knee: Secondary | ICD-10-CM | POA: Diagnosis not present

## 2024-07-11 DIAGNOSIS — M25561 Pain in right knee: Secondary | ICD-10-CM | POA: Diagnosis not present

## 2024-07-11 DIAGNOSIS — M25562 Pain in left knee: Secondary | ICD-10-CM | POA: Diagnosis not present

## 2024-07-11 DIAGNOSIS — M170 Bilateral primary osteoarthritis of knee: Secondary | ICD-10-CM | POA: Diagnosis not present

## 2024-07-11 DIAGNOSIS — R262 Difficulty in walking, not elsewhere classified: Secondary | ICD-10-CM | POA: Diagnosis not present

## 2024-07-18 DIAGNOSIS — M170 Bilateral primary osteoarthritis of knee: Secondary | ICD-10-CM | POA: Diagnosis not present

## 2024-07-18 DIAGNOSIS — M25562 Pain in left knee: Secondary | ICD-10-CM | POA: Diagnosis not present

## 2024-07-18 DIAGNOSIS — R262 Difficulty in walking, not elsewhere classified: Secondary | ICD-10-CM | POA: Diagnosis not present

## 2024-07-18 DIAGNOSIS — M25561 Pain in right knee: Secondary | ICD-10-CM | POA: Diagnosis not present

## 2024-07-25 DIAGNOSIS — Z6841 Body Mass Index (BMI) 40.0 and over, adult: Secondary | ICD-10-CM | POA: Diagnosis not present

## 2024-07-25 DIAGNOSIS — Z1339 Encounter for screening examination for other mental health and behavioral disorders: Secondary | ICD-10-CM | POA: Diagnosis not present

## 2024-07-25 DIAGNOSIS — I429 Cardiomyopathy, unspecified: Secondary | ICD-10-CM | POA: Diagnosis not present

## 2024-07-25 DIAGNOSIS — R5383 Other fatigue: Secondary | ICD-10-CM | POA: Diagnosis not present

## 2024-07-25 DIAGNOSIS — R52 Pain, unspecified: Secondary | ICD-10-CM | POA: Diagnosis not present

## 2024-07-25 DIAGNOSIS — Z79899 Other long term (current) drug therapy: Secondary | ICD-10-CM | POA: Diagnosis not present

## 2024-07-25 DIAGNOSIS — Z299 Encounter for prophylactic measures, unspecified: Secondary | ICD-10-CM | POA: Diagnosis not present

## 2024-07-25 DIAGNOSIS — Z1331 Encounter for screening for depression: Secondary | ICD-10-CM | POA: Diagnosis not present

## 2024-07-25 DIAGNOSIS — I10 Essential (primary) hypertension: Secondary | ICD-10-CM | POA: Diagnosis not present

## 2024-07-25 DIAGNOSIS — Z125 Encounter for screening for malignant neoplasm of prostate: Secondary | ICD-10-CM | POA: Diagnosis not present

## 2024-07-25 DIAGNOSIS — Z7189 Other specified counseling: Secondary | ICD-10-CM | POA: Diagnosis not present

## 2024-07-25 DIAGNOSIS — N1831 Chronic kidney disease, stage 3a: Secondary | ICD-10-CM | POA: Diagnosis not present

## 2024-07-25 DIAGNOSIS — Z0000 Encounter for general adult medical examination without abnormal findings: Secondary | ICD-10-CM | POA: Diagnosis not present

## 2024-07-25 DIAGNOSIS — E7800 Pure hypercholesterolemia, unspecified: Secondary | ICD-10-CM | POA: Diagnosis not present

## 2024-08-04 DIAGNOSIS — M79671 Pain in right foot: Secondary | ICD-10-CM | POA: Diagnosis not present

## 2024-08-04 DIAGNOSIS — L110 Acquired keratosis follicularis: Secondary | ICD-10-CM | POA: Diagnosis not present

## 2024-08-04 DIAGNOSIS — M79675 Pain in left toe(s): Secondary | ICD-10-CM | POA: Diagnosis not present

## 2024-08-04 DIAGNOSIS — M79672 Pain in left foot: Secondary | ICD-10-CM | POA: Diagnosis not present

## 2024-08-04 DIAGNOSIS — I739 Peripheral vascular disease, unspecified: Secondary | ICD-10-CM | POA: Diagnosis not present

## 2024-08-04 DIAGNOSIS — M79674 Pain in right toe(s): Secondary | ICD-10-CM | POA: Diagnosis not present

## 2024-08-21 ENCOUNTER — Other Ambulatory Visit: Payer: Self-pay | Admitting: Cardiovascular Disease

## 2024-08-28 ENCOUNTER — Other Ambulatory Visit: Payer: Self-pay

## 2024-08-30 ENCOUNTER — Other Ambulatory Visit: Payer: Self-pay | Admitting: Cardiovascular Disease

## 2024-08-30 MED ORDER — SPIRONOLACTONE 25 MG PO TABS: 12.5000 mg | ORAL_TABLET | Freq: Every day | ORAL | 0 refills | Status: AC

## 2024-08-31 MED ORDER — SPIRONOLACTONE 25 MG PO TABS: 12.5000 mg | ORAL_TABLET | Freq: Every day | ORAL | 2 refills | Status: AC

## 2024-09-11 NOTE — Progress Notes (Unsigned)
" °  Cardiology Office Note:  .   Date:  09/11/2024  ID:  Robert Warren, DOB [birth year], MRN [id number] PCP: [name], MD  [facility] Providers Cardiologist:  [name], MD {  History of Present Illness: .   Robert Warren is a 79 y.o. male with history of PAF, nonobstructive CAD by heart cath 12/2022, NICM with mildly reduced EF, PVCs, hypertension, hyperlipidemia, OSA on CPAP, CKD, BPH, aortic dilatation     NICM 2023 EF 50 to 55% 12/2022 admission for newly reduced EF 30 to 35%, severely dilated LV.  Mildly reduced RV.  Subsequent LHC with mild nonobstructive CAD.  Of note does have history of frequent PVCs in a pattern of bigeminy.  Asymptomatic. 01/2023 heart monitor with 3.6% PVC burden.  No evidence of atrial fibrillation 03/2023 echocardiogram 45 to 50%  Social history  Research scientist (life sciences)    Patient with history of NICM with mildly reduced EF etiology not clear, previously felt to be from PVCs but heart monitor without significant burden.  Last seen 12/2022 reporting improvement in symptoms from recent hospitalization for CHF as described above compliant with CPAP.  Spironolactone  was increased to 25 mg.  NICM with mildly reduced EF -12/2022 mild nonobstructive CAD.  EF 30 to 35%. -03/2023 EF 45 to 50% Etiology not certain.  PVC burden disproportionate to his degree of cardiomyopathy at diagnosis.  Has shown improvement on most recent echocardiogram.  PVCs 01/2023 heart monitor with 3.6% PVC burden.  No evidence of atrial fibrillation.  Nonobstructive CAD Left heart cath as above. No aspirin  with Eliquis   Aortic dilatation Overdue for echocardiogram, previously 42 mm in 2024.  If it has increased in size at all we will probably proceed with CT for better dimensions.  Hypertension    OSA compliant with CPAP   ROS: Denies: Chest pain, shortness of breath, orthopnea, peripheral edema, palpitations, syncope, decreased exercise capacity, fatigue, dizziness.    Studies Reviewed: .         Risk Assessment/Calculations:   {Does this patient have ATRIAL FIBRILLATION?:[id number]} No BP recorded.  {Refresh Note OR Click here to enter BP  :1}***       Physical Exam:   VS:  There were no vitals taken for this visit.   Wt Readings from Last 3 Encounters:  09/20/23 (!) 330 lb (149.7 kg)  07/15/23 (!) 354 lb 9.6 oz (160.8 kg)  04/28/23 (!) 347 lb 9.6 oz (157.7 kg)    GEN: Well nourished, well developed in no acute distress NECK: No JVD; No carotid bruits CARDIAC: ***RRR, no murmurs, rubs, gallops RESPIRATORY:  Clear to auscultation without rales, wheezing or rhonchi  ABDOMEN: Soft, non-tender, non-distended EXTREMITIES:  No edema; No deformity   ASSESSMENT AND PLAN: .         {Are you ordering a CV Procedure (e.g. stress test, cath, DCCV, TEE, etc)?   Press F2        :[id number]}  Dispo: ***  Signed, Thom LITTIE Sluder, PA-C  "

## 2024-09-13 ENCOUNTER — Ambulatory Visit: Admitting: Cardiology

## 2024-09-13 ENCOUNTER — Other Ambulatory Visit: Payer: Self-pay

## 2024-09-13 ENCOUNTER — Other Ambulatory Visit (HOSPITAL_COMMUNITY): Payer: Self-pay

## 2024-09-13 DIAGNOSIS — I493 Ventricular premature depolarization: Secondary | ICD-10-CM

## 2024-09-13 DIAGNOSIS — I428 Other cardiomyopathies: Secondary | ICD-10-CM

## 2024-09-13 DIAGNOSIS — I77810 Thoracic aortic ectasia: Secondary | ICD-10-CM

## 2024-09-13 DIAGNOSIS — I2510 Atherosclerotic heart disease of native coronary artery without angina pectoris: Secondary | ICD-10-CM

## 2024-09-13 DIAGNOSIS — I10 Essential (primary) hypertension: Secondary | ICD-10-CM

## 2024-09-13 DIAGNOSIS — G4739 Other sleep apnea: Secondary | ICD-10-CM

## 2024-09-13 DIAGNOSIS — I480 Paroxysmal atrial fibrillation: Secondary | ICD-10-CM

## 2024-09-13 MED ORDER — METOPROLOL SUCCINATE ER 25 MG PO TB24
25.0000 mg | ORAL_TABLET | Freq: Every day | ORAL | 0 refills | Status: DC
  Filled 2024-09-13: qty 30, 30d supply, fill #0

## 2024-09-13 MED ORDER — METOPROLOL SUCCINATE ER 25 MG PO TB24: 25.0000 mg | ORAL_TABLET | Freq: Every day | ORAL | 0 refills | Status: AC

## 2024-10-19 ENCOUNTER — Ambulatory Visit: Admitting: Cardiology

## 2025-04-10 ENCOUNTER — Other Ambulatory Visit (HOSPITAL_COMMUNITY)

## 2025-05-02 ENCOUNTER — Ambulatory Visit: Admitting: Urology
